# Patient Record
Sex: Male | Born: 1955 | Race: Asian | Hispanic: No | Marital: Married | Smoking: Never smoker
Health system: Southern US, Community
[De-identification: ages and names within clinical notes are randomized; demographics above are authoritative.]

## PROBLEM LIST (undated history)

## (undated) DIAGNOSIS — E7800 Pure hypercholesterolemia, unspecified: Secondary | ICD-10-CM

## (undated) DIAGNOSIS — I214 Non-ST elevation (NSTEMI) myocardial infarction: Secondary | ICD-10-CM

## (undated) DIAGNOSIS — E119 Type 2 diabetes mellitus without complications: Secondary | ICD-10-CM

## (undated) DIAGNOSIS — R112 Nausea with vomiting, unspecified: Secondary | ICD-10-CM

## (undated) DIAGNOSIS — Z9889 Other specified postprocedural states: Secondary | ICD-10-CM

## (undated) DIAGNOSIS — I2510 Atherosclerotic heart disease of native coronary artery without angina pectoris: Secondary | ICD-10-CM

## (undated) DIAGNOSIS — R011 Cardiac murmur, unspecified: Secondary | ICD-10-CM

## (undated) DIAGNOSIS — R55 Syncope and collapse: Secondary | ICD-10-CM

## (undated) DIAGNOSIS — I10 Essential (primary) hypertension: Secondary | ICD-10-CM

## (undated) HISTORY — DX: Cardiac murmur, unspecified: R01.1

## (undated) HISTORY — DX: Essential (primary) hypertension: I10

## (undated) HISTORY — PX: EYE SURGERY: SHX253

## (undated) HISTORY — PX: CATARACT EXTRACTION W/ INTRAOCULAR LENS  IMPLANT, BILATERAL: SHX1307

---

## 2006-08-06 ENCOUNTER — Ambulatory Visit (HOSPITAL_COMMUNITY): Admission: RE | Admit: 2006-08-06 | Discharge: 2006-08-06 | Payer: Self-pay | Admitting: Cardiology

## 2010-03-17 ENCOUNTER — Ambulatory Visit (HOSPITAL_COMMUNITY): Admission: RE | Admit: 2010-03-17 | Discharge: 2010-03-17 | Payer: Self-pay

## 2011-06-12 ENCOUNTER — Ambulatory Visit
Admission: RE | Admit: 2011-06-12 | Discharge: 2011-06-12 | Disposition: A | Discharge status: Home / Self Care | Payer: No Typology Code available for payment source | Source: Ambulatory Visit | Attending: Internal Medicine | Admitting: Internal Medicine

## 2011-06-12 ENCOUNTER — Other Ambulatory Visit: Payer: Self-pay | Admitting: Internal Medicine

## 2011-06-12 ENCOUNTER — Emergency Department (HOSPITAL_COMMUNITY)
Admission: EM | Admit: 2011-06-12 | Discharge: 2011-06-12 | Disposition: A | Discharge status: Home / Self Care | Payer: Worker's Compensation | Attending: Emergency Medicine | Admitting: Emergency Medicine

## 2011-06-12 DIAGNOSIS — S0990XA Unspecified injury of head, initial encounter: Secondary | ICD-10-CM | POA: Insufficient documentation

## 2011-06-12 DIAGNOSIS — W19XXXA Unspecified fall, initial encounter: Secondary | ICD-10-CM

## 2011-06-12 DIAGNOSIS — I10 Essential (primary) hypertension: Secondary | ICD-10-CM | POA: Insufficient documentation

## 2011-06-12 DIAGNOSIS — S0083XA Contusion of other part of head, initial encounter: Secondary | ICD-10-CM | POA: Insufficient documentation

## 2011-06-12 DIAGNOSIS — IMO0002 Reserved for concepts with insufficient information to code with codable children: Secondary | ICD-10-CM | POA: Insufficient documentation

## 2011-06-12 DIAGNOSIS — R404 Transient alteration of awareness: Secondary | ICD-10-CM | POA: Insufficient documentation

## 2011-06-12 DIAGNOSIS — I6200 Nontraumatic subdural hemorrhage, unspecified: Secondary | ICD-10-CM | POA: Insufficient documentation

## 2011-06-12 DIAGNOSIS — E119 Type 2 diabetes mellitus without complications: Secondary | ICD-10-CM | POA: Insufficient documentation

## 2011-06-12 DIAGNOSIS — R51 Headache: Secondary | ICD-10-CM | POA: Insufficient documentation

## 2011-06-12 DIAGNOSIS — Y92009 Unspecified place in unspecified non-institutional (private) residence as the place of occurrence of the external cause: Secondary | ICD-10-CM | POA: Insufficient documentation

## 2011-06-12 DIAGNOSIS — S0003XA Contusion of scalp, initial encounter: Secondary | ICD-10-CM | POA: Insufficient documentation

## 2011-06-12 DIAGNOSIS — Z79899 Other long term (current) drug therapy: Secondary | ICD-10-CM | POA: Insufficient documentation

## 2011-06-12 LAB — DIFFERENTIAL
Basophils Relative: 1 % (ref 0–1)
Lymphocytes Relative: 28 % (ref 12–46)
Neutrophils Relative %: 66 % (ref 43–77)

## 2011-06-12 LAB — CBC
MCH: 30 pg (ref 26.0–34.0)
MCV: 83.1 fL (ref 78.0–100.0)
Platelets: 196 10*3/uL (ref 150–400)
WBC: 10.1 10*3/uL (ref 4.0–10.5)

## 2011-06-12 LAB — BASIC METABOLIC PANEL
BUN: 12 mg/dL (ref 6–23)
CO2: 23 mEq/L (ref 19–32)
Creatinine, Ser: 0.78 mg/dL (ref 0.50–1.35)
GFR calc Af Amer: >90 mL/min (ref >90)
GFR calc non Af Amer: >90 mL/min (ref >90)
Glucose, Bld: 155 mg/dL — ABNORMAL HIGH (ref 70–99)

## 2011-06-12 LAB — PROTIME-INR: Prothrombin Time: 12.6 seconds (ref 11.6–15.2)

## 2011-06-20 NOTE — Consult Note (Signed)
  NAMEJALYN, Nicholas [name] NO.:  [id number]  MEDICAL RECORD NO.:  [id number]  LOCATION:  [facility]                         FACILITY:  [facility]  PHYSICIAN:  Hilda Lias, M.D.   DATE OF BIRTH:  Jul 22, 1956  DATE OF CONSULTATION:  06/12/2011 DATE OF DISCHARGE:  06/12/2011                                CONSULTATION   HISTORY OF PRESENT ILLNESS:  Nicholas [name] is a 55 year old gentleman who this morning at about 8 o'clock suddenly lost balance and hit his head. There was a question of history of decreased level of conscious.  The patient had a CT scan at about 12 o'clock and we were called about 5 o'clock this afternoon to see him.  By the time, I went to see Nicholas [name], he was awake, sitting, and talking via cell phone with his family.  He had minimal discomfort in the skull.  He had no complaint of headache and according to him he feels excellent except that he has a little dizziness.  Clinically, he has a small abrasion in the parietal area.  There is no fracture I can see.  There is no evidence of CSF or blood coming from the nose.  There was full testing with the cervical spine.  PHYSICAL EXAMINATION:  VITAL SIGNS:  Blood pressure is 140/70 with a pulse of 65, respiratory rate of 16, and temperature 99. NEUROLOGIC:  He is oriented x3.  Cranial nerves, the pupils are equal and reactive.  He has full ocular movement.  Face is symmetric. Swallowing normal.  His strength is normal.  Sensation normal.  IMAGING:  A CT scan showed a tiny subdural hematoma at the falcine superior with no evidence of any other compromise to the brain.  There is no fracture whatsoever.  There is no __________, and there is no other lesion except this tiny subdural lesion superiorly.  This injury happened earlier about 9 hours ago.  My feeling was that Nicholas [name] lives here in [place] with his family and I was ready to send him home today with closed head injury instructions but the physician's  assistant, Nicholas [name], told me that Dr. Freida Busman wants the patient to be admitted and they will be calling Trauma.  He is afraid that this subdural hematoma will get bigger.  I will follow Nicholas [name] while he is admitted simply to the Trauma Service. Again from my point of view, clinically he is stable.  This injury happened about 9 hours ago.  The findings at this point are minimal but I have faint consideration because they are in the emergency room.  I will follow Nicholas [name] along with the Trauma Service.          ______________________________ Hilda Lias, M.D.     EB/MEDQ  D:  06/12/2011  T:  06/13/2011  Job:  [id number]  Electronically Signed by Hilda Lias M.D. on 06/20/2011 11:30:52 AM

## 2011-07-25 ENCOUNTER — Ambulatory Visit: Payer: Worker's Compensation | Attending: Neurosurgery | Admitting: Rehabilitative and Restorative Service Providers"

## 2011-07-25 DIAGNOSIS — H8110 Benign paroxysmal vertigo, unspecified ear: Secondary | ICD-10-CM | POA: Insufficient documentation

## 2011-07-25 DIAGNOSIS — IMO0001 Reserved for inherently not codable concepts without codable children: Secondary | ICD-10-CM | POA: Insufficient documentation

## 2011-08-01 ENCOUNTER — Ambulatory Visit: Payer: Worker's Compensation | Attending: Neurosurgery | Admitting: Physical Therapy

## 2011-08-01 DIAGNOSIS — H8110 Benign paroxysmal vertigo, unspecified ear: Secondary | ICD-10-CM | POA: Insufficient documentation

## 2011-08-01 DIAGNOSIS — IMO0001 Reserved for inherently not codable concepts without codable children: Secondary | ICD-10-CM | POA: Insufficient documentation

## 2011-08-04 ENCOUNTER — Encounter: Payer: Self-pay | Admitting: Physical Therapy

## 2011-08-08 ENCOUNTER — Ambulatory Visit: Payer: Worker's Compensation | Admitting: Rehabilitative and Restorative Service Providers"

## 2011-08-11 ENCOUNTER — Encounter: Payer: Self-pay | Admitting: Rehabilitative and Restorative Service Providers"

## 2011-08-16 ENCOUNTER — Ambulatory Visit: Payer: Worker's Compensation | Admitting: Rehabilitative and Restorative Service Providers"

## 2011-08-16 ENCOUNTER — Encounter: Payer: Self-pay | Admitting: Rehabilitative and Restorative Service Providers"

## 2011-08-24 ENCOUNTER — Encounter: Payer: Self-pay | Admitting: Rehabilitative and Restorative Service Providers"

## 2011-09-06 ENCOUNTER — Ambulatory Visit: Payer: Worker's Compensation | Attending: Neurosurgery | Admitting: Rehabilitative and Restorative Service Providers"

## 2011-09-06 DIAGNOSIS — H8110 Benign paroxysmal vertigo, unspecified ear: Secondary | ICD-10-CM | POA: Insufficient documentation

## 2011-09-06 DIAGNOSIS — IMO0001 Reserved for inherently not codable concepts without codable children: Secondary | ICD-10-CM | POA: Insufficient documentation

## 2011-10-03 ENCOUNTER — Other Ambulatory Visit: Payer: Self-pay | Admitting: Neurology

## 2011-10-03 DIAGNOSIS — R0989 Other specified symptoms and signs involving the circulatory and respiratory systems: Secondary | ICD-10-CM

## 2011-11-02 ENCOUNTER — Ambulatory Visit
Admission: RE | Admit: 2011-11-02 | Discharge: 2011-11-02 | Disposition: A | Discharge status: Home / Self Care | Payer: Self-pay | Source: Ambulatory Visit | Attending: Neurology | Admitting: Neurology

## 2011-11-02 DIAGNOSIS — R0989 Other specified symptoms and signs involving the circulatory and respiratory systems: Secondary | ICD-10-CM

## 2011-12-08 ENCOUNTER — Encounter: Payer: Self-pay | Admitting: Vascular Surgery

## 2011-12-11 ENCOUNTER — Encounter: Payer: Self-pay | Admitting: Vascular Surgery

## 2011-12-22 ENCOUNTER — Encounter: Payer: Self-pay | Admitting: Vascular Surgery

## 2011-12-22 ENCOUNTER — Other Ambulatory Visit: Payer: Self-pay

## 2012-01-18 ENCOUNTER — Encounter: Payer: Self-pay | Admitting: Vascular Surgery

## 2012-01-19 ENCOUNTER — Ambulatory Visit (INDEPENDENT_AMBULATORY_CARE_PROVIDER_SITE_OTHER): Payer: BC Managed Care – PPO | Admitting: Vascular Surgery

## 2012-01-19 ENCOUNTER — Ambulatory Visit (INDEPENDENT_AMBULATORY_CARE_PROVIDER_SITE_OTHER): Payer: BC Managed Care – PPO | Admitting: *Deleted

## 2012-01-19 ENCOUNTER — Encounter: Payer: Self-pay | Admitting: Vascular Surgery

## 2012-01-19 VITALS — BP 114/78 | HR 50 | Temp 98.1°F | Ht 62.0 in | Wt 153.3 lb

## 2012-01-19 DIAGNOSIS — I771 Stricture of artery: Secondary | ICD-10-CM | POA: Insufficient documentation

## 2012-01-19 DIAGNOSIS — I6529 Occlusion and stenosis of unspecified carotid artery: Secondary | ICD-10-CM

## 2012-01-19 HISTORY — DX: Stricture of artery: I77.1
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
## 2012-02-01 ENCOUNTER — Encounter: Payer: Self-pay | Admitting: Vascular Surgery

## 2012-02-02 ENCOUNTER — Encounter: Payer: Self-pay | Admitting: Vascular Surgery

## 2012-02-02 ENCOUNTER — Ambulatory Visit
Admission: RE | Admit: 2012-02-02 | Discharge: 2012-02-02 | Disposition: A | Discharge status: Home / Self Care | Payer: BC Managed Care – PPO | Source: Ambulatory Visit | Attending: Vascular Surgery | Admitting: Vascular Surgery

## 2012-02-02 ENCOUNTER — Ambulatory Visit (INDEPENDENT_AMBULATORY_CARE_PROVIDER_SITE_OTHER): Payer: BC Managed Care – PPO | Admitting: Vascular Surgery

## 2012-02-02 VITALS — BP 139/89 | HR 47 | Resp 12 | Ht 62.0 in | Wt 151.1 lb

## 2012-02-02 DIAGNOSIS — I771 Stricture of artery: Secondary | ICD-10-CM

## 2012-02-02 HISTORY — DX: Stricture of artery: I77.1

## 2012-02-02 MED ORDER — IOHEXOL 350 MG/ML SOLN
100.0000 mL | Freq: Once | INTRAVENOUS | Status: AC | PRN
  Administered 2012-02-02: 100 mL via INTRAVENOUS

## 2012-02-02 NOTE — Progress Notes (Signed)
[facility]   Established Carotid Patient  History of Present Illness  Nicholas [name] is a 56 y.o. (1956-03-26) male who presents with chief complaint: follow up on CTA.  This patient was sent for CTA of neck to evaluate the severity of his L SCA stenosis given an abnormal B carotid duplex.  The patient notes no active left arm complaints.  He notes his previous left arm sx occurred days after certain dietary ingestions.  He denies any active vertebrobasilar sx.  He has previously BPPV which was managed with PT.  Past Medical History, Past Surgical History, Social History, Family History, Medications, Allergies, and Review of Systems are unchanged from previous evaluation on 01/19/12.  Physical Examination  Filed Vitals:   02/02/12 1215 02/02/12 1216  BP: 152/88 139/89  Pulse: 50 47  Resp: 12 12  Height: 5\' 2"  (1.575 m)   Weight: 151 lb 1.6 oz (68.539 kg)   SpO2: 100% 100%   Body mass index is 27.64 kg/(m^2).  General: A&O x 3, WDWN   Pulmonary: Sym exp, good air movt, CTAB, no rales, rhonchi, & wheezing   Cardiac: RRR, Nl S1, S2, no Murmurs, rubs or gallops   Vascular:  Vessel  Right  Left   Radial  Palpable  Palpable but weaker than right   Brachial  Palpable  Palpable but weaker than right   Carotid  Palpable, without bruit, likely transmitted heart sounds  Palpable, without bruit, likely transmitted heart sounds   Aorta  Non-palpable  N/A   Femoral  Palpable  Palpable   Popliteal  Non-palpable  Non-palpable   PT  Palpable  Palpable   DP  Palpable  Palpable    Gastrointestinal: soft, NTND, -G/R, - HSM, - masses, - CVAT B   Musculoskeletal: M/S 5/5 throughout , Extremities without ischemic changes   Neurologic: CN 2-12 intact , Pain and light touch intact in extremities , Motor exam as listed above   CTA Neck (Date: 02/02/12): 1. Approximately 70% stenosis of the proximal left subclavian artery, just proximal to the vertebral artery.  2.  High-grade stenoses at the origins of the vertebral arteries bilaterally.  3. Diffuse wall thickening involving the aorta and more prominently in the proximal great vessels, suggesting a nonspecific vasculitis. This is likely the etiology of the left subclavian stenosis.  4. Atherosclerotic irregularity within the carotid bifurcations bilaterally. There is no significant stenosis.  5. Multilevel facet degenerative changes in the cervical spine without significant stenosis.  Based on my review of this patient's CTA, he has a patent L SCA with stenosis >50% in close proximity to the vertebral artery takeoff.  The innominate artery and L CCA are normal in appearance.  Both VA appear equal in size with a L proximal stenosis.  The basilar artery appears widely patent.  Medical Decision Making  Nicholas [name] is a 56 y.o. male who presents with: L SCA stenosis.   As this pt is asx currently, I don't think there is any advantage to intervening, given the proximity of the L VA to the lesion.  Intervention may compromise the artery or embolize the artery. Also the R VA is being read as the dominant VA, so there is no advantage to pursuing intervention on the [place] also as with a patent basilar being fed by the R VA, there should be minimal vertebrobasilar sx.  I do appreciate the haziness read as possible vasculitis in this patient.  As this is relatively limited in extent, I  don't see any advantage to starting steroids in this patient.  I recommended he take a baby Aspirin a day.  I discussed in depth with the patient the nature of atherosclerosis, and emphasized the importance of maximal medical management including strict control of blood pressure, blood glucose, and lipid levels, antiplatelet agents, obtaining regular exercise, and cessation of smoking.  The patient is aware that without maximal medical management the underlying atherosclerotic disease process will progress, limiting the benefit of any  interventions.  If he develops any further left arm symptoms, he will follow up with [place].  Thank you for allowing [place] to participate in this patient's care.  Leonides Sake, MD [facility] Office: [phone] Pager: [phone]  02/02/2012, 1:57 PM

## 2012-02-02 NOTE — Procedures (Unsigned)
CAROTID DUPLEX EXAM  INDICATION:  Carotid disease  HISTORY: Diabetes:  Yes Cardiac:  No Hypertension:  No Smoking:  No Previous Surgery:  No CV History:  Occasional dizziness Amaurosis Fugax No, Paresthesias No, Hemiparesis No                                      RIGHT             LEFT Brachial systolic pressure:         148               109 Brachial Doppler waveforms:         Normal            Biphasic Vertebral direction of flow:        Antegrade         Antegrade/abnormal DUPLEX VELOCITIES (cm/sec) CCA peak systolic                   59                74 ECA peak systolic                   290               112 ICA peak systolic                   99                87 ICA end diastolic                   46                35 PLAQUE MORPHOLOGY:                  Heterogeneous     Heterogeneous PLAQUE AMOUNT:                      Moderate          Minimal PLAQUE LOCATION:                    CCA, ICA, ECA     CCA, ICA, ECA  IMPRESSION: 1. Right internal carotid artery velocities suggest 1%-39% stenosis. 2. Right external carotid artery stenosis. 3. Left internal carotid artery velocities suggest 1%-39% stenosis. 4. Left vertebral artery is antegrade though abnormal. 5. Brachial pressure difference of >20 mmHg suggestive of subclavian     stenosis. 6. Of note, left subclavian velocity of 243 cm/s was noted.  ___________________________________________ Fransisco Hertz, MD  EM/MEDQ  D:  01/19/2012  T:  01/19/2012  Job:  [id number]

## 2018-02-27 ENCOUNTER — Other Ambulatory Visit: Payer: Self-pay

## 2018-02-27 ENCOUNTER — Emergency Department (HOSPITAL_COMMUNITY): Payer: BLUE CROSS/BLUE SHIELD

## 2018-02-27 ENCOUNTER — Encounter (HOSPITAL_COMMUNITY): Payer: Self-pay | Admitting: Emergency Medicine

## 2018-02-27 ENCOUNTER — Inpatient Hospital Stay (HOSPITAL_COMMUNITY)
Admission: EM | Admit: 2018-02-27 | Discharge: 2018-03-09 | DRG: 234 | Disposition: A | Discharge status: Home Health | Payer: BLUE CROSS/BLUE SHIELD | Attending: Cardiothoracic Surgery | Admitting: Cardiothoracic Surgery

## 2018-02-27 DIAGNOSIS — Z91012 Allergy to eggs: Secondary | ICD-10-CM

## 2018-02-27 DIAGNOSIS — J9811 Atelectasis: Secondary | ICD-10-CM | POA: Diagnosis not present

## 2018-02-27 DIAGNOSIS — I25110 Atherosclerotic heart disease of native coronary artery with unstable angina pectoris: Secondary | ICD-10-CM | POA: Diagnosis present

## 2018-02-27 DIAGNOSIS — I708 Atherosclerosis of other arteries: Secondary | ICD-10-CM | POA: Diagnosis present

## 2018-02-27 DIAGNOSIS — I214 Non-ST elevation (NSTEMI) myocardial infarction: Secondary | ICD-10-CM | POA: Diagnosis not present

## 2018-02-27 DIAGNOSIS — R110 Nausea: Secondary | ICD-10-CM | POA: Diagnosis not present

## 2018-02-27 DIAGNOSIS — Z7984 Long term (current) use of oral hypoglycemic drugs: Secondary | ICD-10-CM

## 2018-02-27 DIAGNOSIS — J939 Pneumothorax, unspecified: Secondary | ICD-10-CM

## 2018-02-27 DIAGNOSIS — Z951 Presence of aortocoronary bypass graft: Secondary | ICD-10-CM

## 2018-02-27 DIAGNOSIS — E1151 Type 2 diabetes mellitus with diabetic peripheral angiopathy without gangrene: Secondary | ICD-10-CM | POA: Diagnosis present

## 2018-02-27 DIAGNOSIS — I2510 Atherosclerotic heart disease of native coronary artery without angina pectoris: Secondary | ICD-10-CM

## 2018-02-27 DIAGNOSIS — I200 Unstable angina: Secondary | ICD-10-CM

## 2018-02-27 DIAGNOSIS — Z8249 Family history of ischemic heart disease and other diseases of the circulatory system: Secondary | ICD-10-CM

## 2018-02-27 DIAGNOSIS — E785 Hyperlipidemia, unspecified: Secondary | ICD-10-CM | POA: Diagnosis present

## 2018-02-27 DIAGNOSIS — Z720 Tobacco use: Secondary | ICD-10-CM

## 2018-02-27 DIAGNOSIS — Z7982 Long term (current) use of aspirin: Secondary | ICD-10-CM

## 2018-02-27 DIAGNOSIS — D6959 Other secondary thrombocytopenia: Secondary | ICD-10-CM | POA: Diagnosis present

## 2018-02-27 DIAGNOSIS — J9383 Other pneumothorax: Secondary | ICD-10-CM | POA: Diagnosis present

## 2018-02-27 DIAGNOSIS — E781 Pure hyperglyceridemia: Secondary | ICD-10-CM | POA: Diagnosis present

## 2018-02-27 DIAGNOSIS — D62 Acute posthemorrhagic anemia: Secondary | ICD-10-CM | POA: Diagnosis not present

## 2018-02-27 DIAGNOSIS — I10 Essential (primary) hypertension: Secondary | ICD-10-CM | POA: Diagnosis present

## 2018-02-27 DIAGNOSIS — Z79899 Other long term (current) drug therapy: Secondary | ICD-10-CM

## 2018-02-27 DIAGNOSIS — I340 Nonrheumatic mitral (valve) insufficiency: Secondary | ICD-10-CM | POA: Diagnosis present

## 2018-02-27 DIAGNOSIS — R001 Bradycardia, unspecified: Secondary | ICD-10-CM | POA: Diagnosis present

## 2018-02-27 LAB — BASIC METABOLIC PANEL
ANION GAP: 7 (ref 5–15)
BUN: 14 mg/dL (ref 8–23)
CALCIUM: 8.8 mg/dL — AB (ref 8.9–10.3)
CO2: 23 mmol/L (ref 22–32)
CREATININE: 0.8 mg/dL (ref 0.61–1.24)
Chloride: 106 mmol/L (ref 98–111)
Glucose, Bld: 213 mg/dL — ABNORMAL HIGH (ref 70–99)
Potassium: 4 mmol/L (ref 3.5–5.1)
SODIUM: 136 mmol/L (ref 135–145)

## 2018-02-27 LAB — I-STAT TROPONIN, ED: Troponin i, poc: 0.01 ng/mL (ref 0.00–0.08)

## 2018-02-27 LAB — CBC
HCT: 41.3 % (ref 39.0–52.0)
HEMOGLOBIN: 13.3 g/dL (ref 13.0–17.0)
MCH: 28.1 pg (ref 26.0–34.0)
MCHC: 32.2 g/dL (ref 30.0–36.0)
MCV: 87.3 fL (ref 78.0–100.0)
PLATELETS: 240 10*3/uL (ref 150–400)
RBC: 4.73 MIL/uL (ref 4.22–5.81)
RDW: 13.3 % (ref 11.5–15.5)
WBC: 8 10*3/uL (ref 4.0–10.5)

## 2018-02-27 NOTE — ED Triage Notes (Signed)
Pt reports CP X 2-3 weeks. Central, radiates to back, heavy in nature. Denies SOB, states taking a deep breath makes the pain better.

## 2018-02-28 ENCOUNTER — Observation Stay (HOSPITAL_BASED_OUTPATIENT_CLINIC_OR_DEPARTMENT_OTHER): Payer: BLUE CROSS/BLUE SHIELD

## 2018-02-28 ENCOUNTER — Encounter (HOSPITAL_COMMUNITY): Payer: Self-pay | Admitting: General Practice

## 2018-02-28 ENCOUNTER — Other Ambulatory Visit: Payer: Self-pay

## 2018-02-28 DIAGNOSIS — I200 Unstable angina: Secondary | ICD-10-CM

## 2018-02-28 DIAGNOSIS — I340 Nonrheumatic mitral (valve) insufficiency: Secondary | ICD-10-CM | POA: Diagnosis not present

## 2018-02-28 DIAGNOSIS — E119 Type 2 diabetes mellitus without complications: Secondary | ICD-10-CM

## 2018-02-28 DIAGNOSIS — I10 Essential (primary) hypertension: Secondary | ICD-10-CM | POA: Diagnosis not present

## 2018-02-28 DIAGNOSIS — I214 Non-ST elevation (NSTEMI) myocardial infarction: Secondary | ICD-10-CM

## 2018-02-28 LAB — LIPID PANEL
CHOL/HDL RATIO: 6.1 ratio
Cholesterol: 200 mg/dL (ref 0–200)
HDL: 33 mg/dL — AB (ref >40)
LDL Cholesterol: 112 mg/dL — ABNORMAL HIGH (ref 0–99)
TRIGLYCERIDES: 273 mg/dL — AB (ref <150)
VLDL: 55 mg/dL — ABNORMAL HIGH (ref 0–40)

## 2018-02-28 LAB — TROPONIN I
TROPONIN I: 0.07 ng/mL — AB (ref <0.03)
TROPONIN I: 0.1 ng/mL — AB (ref <0.03)
Troponin I: 0.04 ng/mL (ref <0.03)

## 2018-02-28 LAB — CREATININE, SERUM
CREATININE: 0.83 mg/dL (ref 0.61–1.24)
GFR calc Af Amer: >60 mL/min (ref >60)
GFR calc non Af Amer: >60 mL/min (ref >60)

## 2018-02-28 LAB — ECHOCARDIOGRAM COMPLETE
Height: 62 in
Weight: 2320 oz

## 2018-02-28 LAB — I-STAT TROPONIN, ED: Troponin i, poc: 0.01 ng/mL (ref 0.00–0.08)

## 2018-02-28 LAB — GLUCOSE, CAPILLARY
GLUCOSE-CAPILLARY: 147 mg/dL — AB (ref 70–99)
GLUCOSE-CAPILLARY: 186 mg/dL — AB (ref 70–99)
GLUCOSE-CAPILLARY: 115 mg/dL — AB (ref 70–99)
Glucose-Capillary: 124 mg/dL — ABNORMAL HIGH (ref 70–99)

## 2018-02-28 LAB — HEPARIN LEVEL (UNFRACTIONATED): HEPARIN UNFRACTIONATED: 0.98 [IU]/mL — AB (ref 0.30–0.70)

## 2018-02-28 LAB — PROTIME-INR
INR: 1
PROTHROMBIN TIME: 13.1 s (ref 11.4–15.2)

## 2018-02-28 MED ORDER — LISINOPRIL 5 MG PO TABS
2.5000 mg | ORAL_TABLET | Freq: Every day | ORAL | Status: DC
  Administered 2018-02-28 – 2018-03-03 (×4): 2.5 mg via ORAL
  Filled 2018-02-28 (×4): qty 1

## 2018-02-28 MED ORDER — ATORVASTATIN CALCIUM 10 MG PO TABS: 10.0000 mg | ORAL_TABLET | Freq: Every day | ORAL | Status: DC

## 2018-02-28 MED ORDER — HEPARIN (PORCINE) IN NACL 100-0.45 UNIT/ML-% IJ SOLN
800.0000 [IU]/h | INTRAMUSCULAR | Status: DC
  Administered 2018-02-28 – 2018-03-01 (×2): 800 [IU]/h via INTRAVENOUS
  Filled 2018-02-28 (×2): qty 250

## 2018-02-28 MED ORDER — NITROGLYCERIN 0.4 MG SL SUBL
0.4000 mg | SUBLINGUAL_TABLET | SUBLINGUAL | Status: DC | PRN
  Administered 2018-03-01 – 2018-03-03 (×2): 0.4 mg via SUBLINGUAL
  Filled 2018-02-28 (×2): qty 1

## 2018-02-28 MED ORDER — SODIUM CHLORIDE 0.9% FLUSH: 3.0000 mL | INTRAVENOUS | Status: DC | PRN

## 2018-02-28 MED ORDER — ENOXAPARIN SODIUM 40 MG/0.4ML ~~LOC~~ SOLN
40.0000 mg | SUBCUTANEOUS | Status: DC
  Administered 2018-02-28: 40 mg via SUBCUTANEOUS
  Filled 2018-02-28 (×2): qty 0.4

## 2018-02-28 MED ORDER — ATORVASTATIN CALCIUM 80 MG PO TABS
80.0000 mg | ORAL_TABLET | Freq: Every day | ORAL | Status: DC
  Administered 2018-02-28 – 2018-03-08 (×8): 80 mg via ORAL
  Filled 2018-02-28 (×8): qty 1

## 2018-02-28 MED ORDER — SODIUM CHLORIDE 0.9 % WEIGHT BASED INFUSION
3.0000 mL/kg/h | INTRAVENOUS | Status: DC
  Administered 2018-03-01: 3 mL/kg/h via INTRAVENOUS

## 2018-02-28 MED ORDER — SODIUM CHLORIDE 0.9 % IV SOLN: 250.0000 mL | INTRAVENOUS | Status: DC | PRN

## 2018-02-28 MED ORDER — ASPIRIN 81 MG PO CHEW
81.0000 mg | CHEWABLE_TABLET | ORAL | Status: AC
  Administered 2018-03-01: 81 mg via ORAL
  Filled 2018-02-28: qty 1

## 2018-02-28 MED ORDER — INSULIN ASPART 100 UNIT/ML ~~LOC~~ SOLN
0.0000 [IU] | Freq: Three times a day (TID) | SUBCUTANEOUS | Status: DC
  Administered 2018-02-28: 2 [IU] via SUBCUTANEOUS
  Administered 2018-03-01 – 2018-03-02 (×2): 3 [IU] via SUBCUTANEOUS

## 2018-02-28 MED ORDER — ASPIRIN EC 81 MG PO TBEC
81.0000 mg | DELAYED_RELEASE_TABLET | Freq: Every day | ORAL | Status: DC
  Administered 2018-02-28 – 2018-03-03 (×4): 81 mg via ORAL
  Filled 2018-02-28 (×4): qty 1

## 2018-02-28 MED ORDER — ACETAMINOPHEN 325 MG PO TABS
650.0000 mg | ORAL_TABLET | ORAL | Status: DC | PRN
  Administered 2018-03-03: 650 mg via ORAL
  Filled 2018-02-28: qty 2

## 2018-02-28 MED ORDER — HEPARIN BOLUS VIA INFUSION
3900.0000 [IU] | Freq: Once | INTRAVENOUS | Status: AC
  Administered 2018-02-28: 3900 [IU] via INTRAVENOUS
  Filled 2018-02-28: qty 3900

## 2018-02-28 MED ORDER — ATENOLOL 50 MG PO TABS
50.0000 mg | ORAL_TABLET | Freq: Every day | ORAL | Status: DC
  Administered 2018-02-28 – 2018-03-03 (×3): 50 mg via ORAL
  Filled 2018-02-28 (×5): qty 1

## 2018-02-28 MED ORDER — SODIUM CHLORIDE 0.9% FLUSH
3.0000 mL | Freq: Two times a day (BID) | INTRAVENOUS | Status: DC
  Administered 2018-02-28: 3 mL via INTRAVENOUS

## 2018-02-28 MED ORDER — SODIUM CHLORIDE 0.9% FLUSH
3.0000 mL | Freq: Two times a day (BID) | INTRAVENOUS | Status: DC
  Administered 2018-02-28 – 2018-03-02 (×4): 3 mL via INTRAVENOUS

## 2018-02-28 MED ORDER — ONDANSETRON HCL 4 MG/2ML IJ SOLN
4.0000 mg | Freq: Four times a day (QID) | INTRAMUSCULAR | Status: DC | PRN
  Administered 2018-03-04: 4 mg via INTRAVENOUS
  Filled 2018-02-28: qty 2

## 2018-02-28 MED ORDER — SODIUM CHLORIDE 0.9 % WEIGHT BASED INFUSION: 1.0000 mL/kg/h | INTRAVENOUS | Status: DC

## 2018-02-28 NOTE — H&P (View-Only) (Signed)
Patient admitted by overnight cardiology at 400AM this morning for unstable angina. Multiple risk factors including DM2, HTN, HL, PAD (subclavian stenosis). Trop up just to 0.04 thus far, still having them cycled. EKG inferior TWI and ST depressions. Echo pending. Plan for cath tomorrow. Med therapy with hep gtt, ASA 81, atenolol 50, lisinopril 2.5, atorva 80. No current chest pain.     Nicholas Loper MD   

## 2018-02-28 NOTE — Progress Notes (Signed)
Patient admitted by overnight cardiology at 400AM this morning for unstable angina. Multiple risk factors including DM2, HTN, HL, PAD (subclavian stenosis). Trop up just to 0.04 thus far, still having them cycled. EKG inferior TWI and ST depressions. Echo pending. Plan for cath tomorrow. Med therapy with hep gtt, ASA 81, atenolol 50, lisinopril 2.5, atorva 80. No current chest pain.     Dina RichJonathan Branch MD

## 2018-02-28 NOTE — H&P (Signed)
Cardiology Admission History and Physical:   Patient ID: Nicholas [name]; MRN: [id number]; DOB: 02-27-1956   Admission date: 02/27/2018  Primary Care Provider: Amelia Jo, MD   Chief Complaint:  Crescendo angina  Patient Profile:   Nicholas [name] is a 62 y.o. male with a history of type 2 diabetes mellitus, hypertension, hyperlipidemia and subclavian artery stenosis who presents for an evaluation of substernal chest pain.  History of Present Illness:   [name] has been having off-and-on chest pain for the past 2-3 months. He had a particularly severe episode of pain after mowing the grass outside his house. Yesterday after a heavy meal he also reported chest pain. He had associated shortness of breath. The pain radiated to his right arm. Due to the severity and persistent nature of the pain he then presented to the emergency department. He did not have any diaphoresis, nausea or vomiting. He also does not report any palpitations, presyncope or syncope. Chest pain is described as a heaviness across the chest. He found relief once he was given nitroglycerin in the hospital.  There is no lower extremity edema, orthopnea or paroxysmal nocturnal dyspnea.  EKG:  02/28/2018 - sinus bradycardia, left ventricular hypertrophy, nonspecific T-wave abnormalities in the inferolateral leads. Ventricular rate 52 bpm, PR interval 178, QRS duration 96 and QTC 414 msec.     Review of Systems  Constitutional: Negative for chills, fever and weight loss.  HENT: Negative for hearing loss.   Eyes: Negative for blurred vision.  Respiratory: Negative for cough.   Cardiovascular: Positive for chest pain. Negative for orthopnea, leg swelling and PND.  Gastrointestinal: Negative for heartburn, nausea and vomiting.  Genitourinary: Negative for frequency.  Musculoskeletal: Negative for myalgias.  Skin: Negative for rash.  Neurological: Negative for dizziness and headaches.  Endo/Heme/Allergies: Does not  bruise/bleed easily.  Psychiatric/Behavioral: Negative for depression.      Past Medical History:  Diagnosis Date  . Diabetes mellitus   . Heart murmur   . Hypertension     History reviewed. No pertinent surgical history.   Medications Prior to Admission: Prior to Admission medications   Medication Sig Start Date End Date Taking? Authorizing Provider  aspirin EC 81 MG tablet Take 81 mg by mouth at bedtime.   Yes [provider]  atenolol (TENORMIN) 50 MG tablet Take 50 mg by mouth daily.  12/28/11  Yes [provider]  finasteride (PROSCAR) 5 MG tablet Take 5 mg by mouth daily.   Yes [provider]  lisinopril (PRINIVIL,ZESTRIL) 2.5 MG tablet Take 2.5 mg by mouth at bedtime.   Yes [provider]  metFORMIN (GLUCOPHAGE) 500 MG tablet Take 500 mg by mouth 2 (two) times daily with a meal.   Yes [provider]     Allergies:    Allergies  Allergen Reactions  . Eggs Or Egg-Derived Products Itching and Rash    Social History:   He is married and is a nonsmoker. He drives a truck. He does not drink any alcohol.  Social History   Socioeconomic History  . Marital status: Married    Spouse name: Not on file  . Number of children: Not on file  . Years of education: Not on file  . Highest education level: Not on file  Occupational History  . Not on file  Social Needs  . Financial resource strain: Not on file  . Food insecurity:    Worry: Not on file    Inability: Not on file  . Transportation  needs:    Medical: Not on file    Non-medical: Not on file  Tobacco Use  . Smoking status: Never Smoker  . Smokeless tobacco: Never Used  Substance and Sexual Activity  . Alcohol use: No  . Drug use: No  . Sexual activity: Not on file  Lifestyle  . Physical activity:    Days per week: Not on file    Minutes per session: Not on file  . Stress: Not on file  Relationships  . Social connections:    Talks on phone: Not on file     Gets together: Not on file    Attends religious service: Not on file    Active member of club or organization: Not on file    Attends meetings of clubs or organizations: Not on file    Relationship status: Not on file  . Intimate partner violence:    Fear of current or ex partner: Not on file    Emotionally abused: Not on file    Physically abused: Not on file    Forced sexual activity: Not on file  Other Topics Concern  . Not on file  Social History Narrative  . Not on file    Family History:  The patient's family history includes Heart disease in his mother.   His mother died from a myocardial infarction at age 62. The patient's father is also deceased. He passed away at age 23 from renal failure.   Physical Exam/Data:   Vitals:   02/28/18 0230 02/28/18 0245 02/28/18 0300 02/28/18 0330  BP: (!) 150/82 (!) 146/80 (!) 152/87 127/81  Pulse: (!) 52 (!) 52 (!) 52 (!) 55  Resp: 12 (!) 22 20 19   Temp:      TempSrc:      SpO2: 99% 97% 98% 97%  Weight:      Height:       No intake or output data in the 24 hours ending 02/28/18 0349 Filed Weights   02/28/18 0214  Weight: 65.8 kg (145 lb)   Body mass index is 26.52 kg/m.  General:  Well nourished, well developed, in no acute distress HEENT: normal Lymph: no adenopathy Neck: no JVD Endocrine:  No thryomegaly Vascular: No carotid bruits; FA pulses 2+ bilaterally without bruits  Cardiac:  normal S1, S2; RRR; no murmur  Lungs:  clear to auscultation bilaterally, no wheezing, rhonchi or rales  Abd: soft, nontender, no hepatomegaly  Ext: no edema Musculoskeletal:  No deformities, BUE and BLE strength normal and equal Skin: warm and dry  Neuro:  CNs 2-12 intact, no focal abnormalities noted Psych:  Normal affect    EKG:  02/28/2018 - sinus bradycardia, left ventricular hypertrophy, nonspecific T-wave abnormalities in the inferolateral leads. Ventricular rate 52 bpm, PR interval 178, QRS duration 96 and QTC 414 msec.  Relevant  CV Studies: No recent studies  Laboratory Data:  Chemistry Recent Labs  Lab 02/27/18 2257  NA 136  K 4.0  CL 106  CO2 23  GLUCOSE 213*  BUN 14  CREATININE 0.80  CALCIUM 8.8*  GFRNONAA >60  GFRAA >60  ANIONGAP 7    No results for input(s): PROT, ALBUMIN, AST, ALT, ALKPHOS, BILITOT in the last 168 hours. Hematology Recent Labs  Lab 02/27/18 2257  WBC 8.0  RBC 4.73  HGB 13.3  HCT 41.3  MCV 87.3  MCH 28.1  MCHC 32.2  RDW 13.3  PLT 240   Cardiac EnzymesNo results for input(s): TROPONINI in the last 168 hours.  Recent Labs  Lab 02/27/18 2308 02/28/18 0156  TROPIPOC 0.01 0.01    BNPNo results for input(s): BNP, PROBNP in the last 168 hours.  DDimer No results for input(s): DDIMER in the last 168 hours.  Radiology/Studies:  Dg Chest 2 View  Result Date: 02/27/2018 CLINICAL DATA:  Chest pain EXAM: CHEST - 2 VIEW COMPARISON:  None. FINDINGS: The heart size and mediastinal contours are within normal limits. Both lungs are clear. Mild degenerative changes of the spine. IMPRESSION: No active cardiopulmonary disease. Electronically Signed   By: Jasmine PangKim  Fujinaga M.D.   On: 02/27/2018 23:20    Assessment and Plan:   1. Acute coronary syndrome/unstable angina The patient's symptoms are fairly typical for unstable angina. There has been a definite progression in the severity of his chest pain. Initially he would experience it on moderate exertion. Last night however he had postprandial chest pain. This was only relieved once he came to the hospital and received nitroglycerin.  - Aspirin 81 mg daily - Continue atenolol at 50 mg daily - Start atorvastatin - Will hold off on unfractionated heparin since the initial troponin is negative - Transthoracic echocardiogram in the morning - Will consider left heart catheterization to delineate his coronary anatomy (? 03/01/18) - Will keep NPO for now  2.  Essential hypertension  - Continue atenolol and lisinopril - Echocardiogram to  evaluate LV systolic and diastolic function  3.  Type 2 diabetes mellitus  - Will hold metformin in anticipation of a cardiac catheterization in the near future.    Severity of Illness: The appropriate patient status for this patient is OBSERVATION. Observation status is judged to be reasonable and necessary in order to provide the required intensity of service to ensure the patient's safety. The patient's presenting symptoms, physical exam findings, and initial radiographic and laboratory data in the context of their medical condition is felt to place them at decreased risk for further clinical deterioration. Furthermore, it is anticipated that the patient will be medically stable for discharge from the hospital within 2 midnights of admission. The following factors support the patient status of observation.   " The patient's presenting symptoms include substernal chest pain. " The physical exam findings include normal cardiovascular auscultation. " The initial radiographic and laboratory data are unremarkable.     For questions or updates, please contact [facility] Please consult [email or web address] for contact info under Cardiology/STEMI.    Signed, Lonie PeakMohammed W Kollyns Mickelson, MD  02/28/2018 3:49 AM

## 2018-02-28 NOTE — ED Provider Notes (Signed)
[facility]   CSN: [id number] Arrival date & time: 02/27/18  2223     History   Chief Complaint Chief Complaint  Patient presents with  . Chest Pain    HPI Nicholas [name] is a 62 y.o. male.  The history is provided by the patient and a relative.  Chest Pain   This is a new problem. Episode onset: several hrs ago. The problem has been resolved. The pain is associated with exertion. The pain is present in the substernal region. The pain is severe. The quality of the pain is described as heavy. The pain radiates to the left shoulder and right shoulder. Episode Length: several minutes. Associated symptoms include shortness of breath. Pertinent negatives include no diaphoresis and no vomiting. He has tried rest for the symptoms. The treatment provided significant relief. Risk factors include being elderly.  His past medical history is significant for diabetes and hypertension.  Patient with history of hypertension and diabetes presents with chest pain.  He reports several hours ago he had onset of chest heaviness rating to both shoulders for several minutes. He is now chest pain-free.  This episode was triggered by exertion.  He recalls a similar episode several weeks ago while mowing the grass.  It improved with rest Known history of CAD. Past Medical History:  Diagnosis Date  . Diabetes mellitus   . Heart murmur   . Hypertension     Patient Active Problem List   Diagnosis Date Noted  . Stricture of artery (HCC) 02/02/2012  . Subclavian arterial stenosis (HCC) 01/19/2012    History reviewed. No pertinent surgical history.      Home Medications    Prior to Admission medications   Medication Sig Start Date End Date Taking? Authorizing Provider  atenolol (TENORMIN) 50 MG tablet Take 50 mg by mouth daily.  12/28/11   [provider]  GLIPIZIDE PO Take 2.5 mg by mouth daily.     [provider]    Family  History Family History  Problem Relation Age of Onset  . Heart disease Mother     Social History Social History   Tobacco Use  . Smoking status: Never Smoker  . Smokeless tobacco: Never Used  Substance Use Topics  . Alcohol use: No  . Drug use: No     Allergies   Eggs or egg-derived products   Review of Systems Review of Systems  Constitutional: Negative for diaphoresis.  Respiratory: Positive for shortness of breath.   Cardiovascular: Positive for chest pain.  Gastrointestinal: Negative for vomiting.  All other systems reviewed and are negative.    Physical Exam Updated Vital Signs BP 138/82   Pulse (!) 50   Temp 97.7 F (36.5 C) (Oral)   Resp (!) 26   Ht 1.575 m (5\' 2" )   Wt 65.8 kg (145 lb)   SpO2 97%   BMI 26.52 kg/m   Physical Exam CONSTITUTIONAL: Well developed/well nourished, elderly, no acute distress, smiling HEAD: Normocephalic/atraumatic EYES: EOMI ENMT: Mucous membranes moist NECK: supple no meningeal signs SPINE/BACK:entire spine nontender CV: S1/S2 noted, no murmurs/rubs/gallops noted LUNGS: Lungs are clear to auscultation bilaterally, no apparent distress ABDOMEN: soft, nontender GU:no cva tenderness NEURO: Pt is awake/alert/appropriate, moves all extremitiesx4.  No facial droop.   EXTREMITIES: pulses normal/equalx4, full ROM, no calf tenderness SKIN: warm, color normal PSYCH: no abnormalities of mood noted, alert and oriented to situation   ED Treatments / Results  Labs (all labs ordered  are listed, but only abnormal results are displayed) Labs Reviewed  BASIC METABOLIC PANEL - Abnormal; Notable for the following components:      Result Value   Glucose, Bld 213 (*)    Calcium 8.8 (*)    All other components within normal limits  CBC  I-STAT TROPONIN, ED  I-STAT TROPONIN, ED    EKG EKG Interpretation  Date/Time:  Thursday February 28 2018 01:46:59 EDT Ventricular Rate:  52 PR Interval:  178 QRS Duration: 96 QT  Interval:  446 QTC Calculation: 414 R Axis:   -13 Text Interpretation:  Sinus bradycardia Moderate voltage criteria for LVH, may be normal variant No significant change since last tracing Abnormal ekg Confirmed by Zadie Rhine ([id number]) on 02/28/2018 1:52:05 AM   Radiology Dg Chest 2 View  Result Date: 02/27/2018 CLINICAL DATA:  Chest pain EXAM: CHEST - 2 VIEW COMPARISON:  None. FINDINGS: The heart size and mediastinal contours are within normal limits. Both lungs are clear. Mild degenerative changes of the spine. IMPRESSION: No active cardiopulmonary disease. Electronically Signed   By: Jasmine Pang M.D.   On: 02/27/2018 23:20    Procedures Procedures   Medications Ordered in ED Medications - No data to display   Initial Impression / Assessment and Plan / ED Course  I have reviewed the triage vital signs and the nursing notes.  Pertinent labs & imaging results that were available during my care of the patient were reviewed by me and considered in my medical decision making (see chart for details).     3:11 AM Patient with concerning history of chest pain.  He reports while exerting himself he will have chest heaviness radiates to both shoulders.  He is now chest pain-free.  He has an abnormal EKG.  I am concerned for unstable angina. Initial troponin is negative.  I discussed the case with cardiology fellow, and they agree with assessment and will admit to their service.  Final Clinical Impressions(s) / ED Diagnoses   Final diagnoses:  Unstable angina [facility])    ED Discharge Orders    None       Zadie Rhine, MD 02/28/18 [phone]

## 2018-02-28 NOTE — Progress Notes (Signed)
ANTICOAGULATION CONSULT NOTE - Initial Consult  Pharmacy Consult for Heparin Indication: chest pain/ACS  Allergies  Allergen Reactions  . Eggs Or Egg-Derived Products Itching and Rash    Patient Measurements: Height: 5\' 2"  (157.5 cm) Weight: 145 lb (65.8 kg) IBW/kg (Calculated) : 54.6   Vital Signs: Temp: 97.6 F (36.4 C) (07/04 0417) Temp Source: Oral (07/04 0417) BP: 164/78 (07/04 0417) Pulse Rate: 50 (07/04 0417)  Labs: Recent Labs    02/27/18 2257 02/28/18 0346 02/28/18 0612  HGB 13.3  --   --   HCT 41.3  --   --   PLT 240  --   --   LABPROT  --   --  13.1  INR  --   --  1.00  CREATININE 0.80 0.83  --   TROPONINI  --  0.04*  --     Estimated Creatinine Clearance: 75.2 mL/min (by C-G formula based on SCr of 0.83 mg/dL).   Medical History: Past Medical History:  Diagnosis Date  . Diabetes mellitus   . Heart murmur   . Hypertension     Assessment: 62 yo male admitted with substernal chest pain. Starting on a heparin drip.  Goal of Therapy:  Heparin level 0.3-0.7 units/ml Monitor platelets by anticoagulation protocol: Yes   Plan:  Give 3900 units bolus x 1 Start heparin infusion at 800 units/hr Check anti-Xa level in 6 hours and daily while on heparin Continue to monitor H&H and platelets  Tera Materatherine A Lizandro Spellman, PharmD, FCCM 02/28/2018,7:37 AM

## 2018-03-01 ENCOUNTER — Ambulatory Visit (HOSPITAL_COMMUNITY): Payer: BLUE CROSS/BLUE SHIELD

## 2018-03-01 ENCOUNTER — Other Ambulatory Visit: Payer: Self-pay | Admitting: *Deleted

## 2018-03-01 ENCOUNTER — Inpatient Hospital Stay (HOSPITAL_COMMUNITY): Payer: BLUE CROSS/BLUE SHIELD

## 2018-03-01 ENCOUNTER — Inpatient Hospital Stay (HOSPITAL_COMMUNITY)
Admission: EM | Disposition: A | Discharge status: Home Health | Payer: Self-pay | Source: Home / Self Care | Attending: Cardiothoracic Surgery

## 2018-03-01 ENCOUNTER — Encounter (HOSPITAL_COMMUNITY): Payer: Self-pay | Admitting: Cardiology

## 2018-03-01 DIAGNOSIS — I708 Atherosclerosis of other arteries: Secondary | ICD-10-CM | POA: Diagnosis present

## 2018-03-01 DIAGNOSIS — E785 Hyperlipidemia, unspecified: Secondary | ICD-10-CM

## 2018-03-01 DIAGNOSIS — Z7984 Long term (current) use of oral hypoglycemic drugs: Secondary | ICD-10-CM | POA: Diagnosis not present

## 2018-03-01 DIAGNOSIS — Z01810 Encounter for preprocedural cardiovascular examination: Secondary | ICD-10-CM

## 2018-03-01 DIAGNOSIS — R110 Nausea: Secondary | ICD-10-CM | POA: Diagnosis not present

## 2018-03-01 DIAGNOSIS — Z91012 Allergy to eggs: Secondary | ICD-10-CM | POA: Diagnosis not present

## 2018-03-01 DIAGNOSIS — Z720 Tobacco use: Secondary | ICD-10-CM | POA: Diagnosis not present

## 2018-03-01 DIAGNOSIS — I200 Unstable angina: Secondary | ICD-10-CM | POA: Diagnosis present

## 2018-03-01 DIAGNOSIS — J9383 Other pneumothorax: Secondary | ICD-10-CM | POA: Diagnosis present

## 2018-03-01 DIAGNOSIS — Z8249 Family history of ischemic heart disease and other diseases of the circulatory system: Secondary | ICD-10-CM | POA: Diagnosis not present

## 2018-03-01 DIAGNOSIS — I25110 Atherosclerotic heart disease of native coronary artery with unstable angina pectoris: Secondary | ICD-10-CM | POA: Diagnosis present

## 2018-03-01 DIAGNOSIS — I10 Essential (primary) hypertension: Secondary | ICD-10-CM | POA: Diagnosis present

## 2018-03-01 DIAGNOSIS — E119 Type 2 diabetes mellitus without complications: Secondary | ICD-10-CM | POA: Diagnosis not present

## 2018-03-01 DIAGNOSIS — Z79899 Other long term (current) drug therapy: Secondary | ICD-10-CM | POA: Diagnosis not present

## 2018-03-01 DIAGNOSIS — E1151 Type 2 diabetes mellitus with diabetic peripheral angiopathy without gangrene: Secondary | ICD-10-CM | POA: Diagnosis present

## 2018-03-01 DIAGNOSIS — I214 Non-ST elevation (NSTEMI) myocardial infarction: Secondary | ICD-10-CM | POA: Diagnosis present

## 2018-03-01 DIAGNOSIS — Z7982 Long term (current) use of aspirin: Secondary | ICD-10-CM | POA: Diagnosis not present

## 2018-03-01 DIAGNOSIS — I2510 Atherosclerotic heart disease of native coronary artery without angina pectoris: Secondary | ICD-10-CM

## 2018-03-01 DIAGNOSIS — I771 Stricture of artery: Secondary | ICD-10-CM | POA: Diagnosis not present

## 2018-03-01 DIAGNOSIS — R001 Bradycardia, unspecified: Secondary | ICD-10-CM | POA: Diagnosis present

## 2018-03-01 DIAGNOSIS — I340 Nonrheumatic mitral (valve) insufficiency: Secondary | ICD-10-CM | POA: Diagnosis present

## 2018-03-01 DIAGNOSIS — I25118 Atherosclerotic heart disease of native coronary artery with other forms of angina pectoris: Secondary | ICD-10-CM | POA: Diagnosis not present

## 2018-03-01 DIAGNOSIS — D6959 Other secondary thrombocytopenia: Secondary | ICD-10-CM | POA: Diagnosis present

## 2018-03-01 DIAGNOSIS — J9811 Atelectasis: Secondary | ICD-10-CM | POA: Diagnosis not present

## 2018-03-01 DIAGNOSIS — E781 Pure hyperglyceridemia: Secondary | ICD-10-CM | POA: Diagnosis present

## 2018-03-01 DIAGNOSIS — D62 Acute posthemorrhagic anemia: Secondary | ICD-10-CM | POA: Diagnosis not present

## 2018-03-01 HISTORY — PX: LEFT HEART CATH AND CORONARY ANGIOGRAPHY: CATH118249

## 2018-03-01 HISTORY — PX: CORONARY PRESSURE/FFR STUDY: CATH118243

## 2018-03-01 LAB — PULMONARY FUNCTION TEST
FEF 25-75 Post: 1.55 L/sec
FEF 25-75 Pre: 1.1 L/sec
FEF2575-%Change-Post: 40 %
FEF2575-%Pred-Post: 75 %
FEF2575-%Pred-Pre: 53 %
FEV1-%Change-Post: 6 %
FEV1-%Pred-Post: 56 %
FEV1-%Pred-Pre: 53 %
FEV1-Post: 1.41 L
FEV1-Pre: 1.32 L
FEV1FVC-%Change-Post: 10 %
FEV1FVC-%Pred-Pre: 103 %
FEV6-%Change-Post: -1 %
FEV6-%Pred-Post: 52 %
FEV6-%Pred-Pre: 53 %
FEV6-Post: 1.64 L
FEV6-Pre: 1.67 L
FEV6FVC-%Change-Post: 1 %
FEV6FVC-%Pred-Post: 106 %
FEV6FVC-%Pred-Pre: 104 %
FVC-%Change-Post: -3 %
FVC-%Pred-Post: 49 %
FVC-%Pred-Pre: 50 %
FVC-Post: 1.64 L
Post FEV1/FVC ratio: 86 %
Post FEV6/FVC ratio: 100 %
Pre FEV1/FVC ratio: 78 %
Pre FEV6/FVC Ratio: 98 %

## 2018-03-01 LAB — BASIC METABOLIC PANEL
Anion gap: 7 (ref 5–15)
BUN: 8 mg/dL (ref 8–23)
CHLORIDE: 109 mmol/L (ref 98–111)
CO2: 24 mmol/L (ref 22–32)
Calcium: 8.4 mg/dL — ABNORMAL LOW (ref 8.9–10.3)
Creatinine, Ser: 0.81 mg/dL (ref 0.61–1.24)
GFR calc Af Amer: >60 mL/min (ref >60)
GFR calc non Af Amer: >60 mL/min (ref >60)
GLUCOSE: 133 mg/dL — AB (ref 70–99)
POTASSIUM: 4.1 mmol/L (ref 3.5–5.1)
Sodium: 140 mmol/L (ref 135–145)

## 2018-03-01 LAB — URINALYSIS, ROUTINE W REFLEX MICROSCOPIC
Bilirubin Urine: NEGATIVE
Glucose, UA: NEGATIVE mg/dL
Hgb urine dipstick: NEGATIVE
Ketones, ur: NEGATIVE mg/dL
Leukocytes, UA: NEGATIVE
Nitrite: NEGATIVE
Protein, ur: NEGATIVE mg/dL
Specific Gravity, Urine: 1.004 — ABNORMAL LOW (ref 1.005–1.030)
pH: 6 (ref 5.0–8.0)

## 2018-03-01 LAB — TROPONIN I: TROPONIN I: 0.11 ng/mL — AB (ref <0.03)

## 2018-03-01 LAB — POCT ACTIVATED CLOTTING TIME: Activated Clotting Time: 279 seconds

## 2018-03-01 LAB — GLUCOSE, CAPILLARY
Glucose-Capillary: 159 mg/dL — ABNORMAL HIGH (ref 70–99)
Glucose-Capillary: 127 mg/dL — ABNORMAL HIGH (ref 70–99)
Glucose-Capillary: 205 mg/dL — ABNORMAL HIGH (ref 70–99)
Glucose-Capillary: 159 mg/dL — ABNORMAL HIGH (ref 70–99)

## 2018-03-01 LAB — HIV ANTIBODY (ROUTINE TESTING W REFLEX): HIV Screen 4th Generation wRfx: NONREACTIVE

## 2018-03-01 LAB — HEPARIN LEVEL (UNFRACTIONATED): Heparin Unfractionated: 0.52 IU/mL (ref 0.30–0.70)

## 2018-03-01 SURGERY — LEFT HEART CATH AND CORONARY ANGIOGRAPHY
Anesthesia: LOCAL

## 2018-03-01 MED ORDER — NITROGLYCERIN 1 MG/10 ML FOR IR/CATH LAB
INTRA_ARTERIAL | Status: AC
  Filled 2018-03-01: qty 10

## 2018-03-01 MED ORDER — SODIUM CHLORIDE 0.9 % WEIGHT BASED INFUSION: 1.0000 mL/kg/h | INTRAVENOUS | Status: AC

## 2018-03-01 MED ORDER — SODIUM CHLORIDE 0.9% FLUSH
3.0000 mL | Freq: Two times a day (BID) | INTRAVENOUS | Status: DC
  Administered 2018-03-01 – 2018-03-03 (×3): 3 mL via INTRAVENOUS

## 2018-03-01 MED ORDER — ISOSORBIDE MONONITRATE ER 30 MG PO TB24
30.0000 mg | ORAL_TABLET | Freq: Every day | ORAL | Status: DC
  Administered 2018-03-01 – 2018-03-03 (×3): 30 mg via ORAL
  Filled 2018-03-01 (×3): qty 1

## 2018-03-01 MED ORDER — FENTANYL CITRATE (PF) 100 MCG/2ML IJ SOLN
INTRAMUSCULAR | Status: AC
  Filled 2018-03-01: qty 2

## 2018-03-01 MED ORDER — HEPARIN SODIUM (PORCINE) 1000 UNIT/ML IJ SOLN
INTRAMUSCULAR | Status: DC | PRN
  Administered 2018-03-01 (×2): 3500 [IU] via INTRAVENOUS

## 2018-03-01 MED ORDER — DOCUSATE SODIUM 100 MG PO CAPS
100.0000 mg | ORAL_CAPSULE | Freq: Every day | ORAL | Status: DC | PRN
  Administered 2018-03-02: 100 mg via ORAL
  Filled 2018-03-01 (×2): qty 1

## 2018-03-01 MED ORDER — SODIUM CHLORIDE 0.9 % IV SOLN: 250.0000 mL | INTRAVENOUS | Status: DC | PRN

## 2018-03-01 MED ORDER — FINASTERIDE 5 MG PO TABS
5.0000 mg | ORAL_TABLET | Freq: Every day | ORAL | Status: DC
  Administered 2018-03-01 – 2018-03-09 (×8): 5 mg via ORAL
  Filled 2018-03-01 (×8): qty 1

## 2018-03-01 MED ORDER — LIDOCAINE HCL (PF) 1 % IJ SOLN
INTRAMUSCULAR | Status: AC
  Filled 2018-03-01: qty 30

## 2018-03-01 MED ORDER — MIDAZOLAM HCL 2 MG/2ML IJ SOLN
INTRAMUSCULAR | Status: AC
  Filled 2018-03-01: qty 2

## 2018-03-01 MED ORDER — MIDAZOLAM HCL 2 MG/2ML IJ SOLN
INTRAMUSCULAR | Status: DC | PRN
  Administered 2018-03-01: 2 mg via INTRAVENOUS

## 2018-03-01 MED ORDER — NITROGLYCERIN IN D5W 200-5 MCG/ML-% IV SOLN
INTRAVENOUS | Status: AC
  Filled 2018-03-01: qty 250

## 2018-03-01 MED ORDER — VERAPAMIL HCL 2.5 MG/ML IV SOLN
INTRAVENOUS | Status: DC | PRN
  Administered 2018-03-01: 10 mL via INTRA_ARTERIAL

## 2018-03-01 MED ORDER — ADENOSINE (DIAGNOSTIC) 140MCG/KG/MIN
INTRAVENOUS | Status: DC | PRN
  Administered 2018-03-01: 140 ug/kg/min via INTRAVENOUS

## 2018-03-01 MED ORDER — ADENOSINE 12 MG/4ML IV SOLN
INTRAVENOUS | Status: AC
  Filled 2018-03-01: qty 16

## 2018-03-01 MED ORDER — NITROGLYCERIN 1 MG/10 ML FOR IR/CATH LAB
INTRA_ARTERIAL | Status: DC | PRN
  Administered 2018-03-01: 200 ug via INTRACORONARY

## 2018-03-01 MED ORDER — FENTANYL CITRATE (PF) 100 MCG/2ML IJ SOLN
INTRAMUSCULAR | Status: DC | PRN
  Administered 2018-03-01: 25 ug via INTRAVENOUS

## 2018-03-01 MED ORDER — HEPARIN (PORCINE) IN NACL 1000-0.9 UT/500ML-% IV SOLN
INTRAVENOUS | Status: AC
  Filled 2018-03-01: qty 1000

## 2018-03-01 MED ORDER — HEPARIN (PORCINE) IN NACL 100-0.45 UNIT/ML-% IJ SOLN
950.0000 [IU]/h | INTRAMUSCULAR | Status: DC
  Administered 2018-03-01: 800 [IU]/h via INTRAVENOUS
  Administered 2018-03-02 – 2018-03-03 (×2): 950 [IU]/h via INTRAVENOUS
  Filled 2018-03-01 (×3): qty 250

## 2018-03-01 MED ORDER — LIDOCAINE HCL (PF) 1 % IJ SOLN
INTRAMUSCULAR | Status: DC | PRN
  Administered 2018-03-01: 2 mL

## 2018-03-01 MED ORDER — SODIUM CHLORIDE 0.9% FLUSH: 3.0000 mL | INTRAVENOUS | Status: DC | PRN

## 2018-03-01 MED ORDER — HEPARIN (PORCINE) IN NACL 2-0.9 UNITS/ML
INTRAMUSCULAR | Status: AC | PRN
  Administered 2018-03-01 (×2): 500 mL

## 2018-03-01 MED ORDER — HEPARIN SODIUM (PORCINE) 1000 UNIT/ML IJ SOLN
INTRAMUSCULAR | Status: AC
  Filled 2018-03-01: qty 1

## 2018-03-01 MED ORDER — ALBUTEROL SULFATE (2.5 MG/3ML) 0.083% IN NEBU
2.5000 mg | INHALATION_SOLUTION | Freq: Once | RESPIRATORY_TRACT | Status: AC
  Administered 2018-03-01: 2.5 mg via RESPIRATORY_TRACT

## 2018-03-01 SURGICAL SUPPLY — 12 items
CATH 5FR JL3.5 JR4 ANG PIG MP (CATHETERS) ×1 IMPLANT
CATH VISTA GUIDE 6FR XBLAD3.5 (CATHETERS) ×1 IMPLANT
DEVICE RAD COMP TR BAND LRG (VASCULAR PRODUCTS) ×1 IMPLANT
GLIDESHEATH SLEND SS 6F .021 (SHEATH) ×1 IMPLANT
GUIDEWIRE INQWIRE 1.5J.035X260 (WIRE) IMPLANT
GUIDEWIRE PRESSURE COMET II (WIRE) ×1 IMPLANT
INQWIRE 1.5J .035X260CM (WIRE) ×2
KIT HEART LEFT (KITS) ×2 IMPLANT
KIT HEMO VALVE WATCHDOG (MISCELLANEOUS) ×1 IMPLANT
PACK CARDIAC CATHETERIZATION (CUSTOM PROCEDURE TRAY) ×2 IMPLANT
TRANSDUCER W/STOPCOCK (MISCELLANEOUS) ×2 IMPLANT
TUBING CIL FLEX 10 FLL-RA (TUBING) ×2 IMPLANT

## 2018-03-01 NOTE — Progress Notes (Signed)
Pt c/o of tingling and burning in his chest. PA notified and ordered to do EKG and to give 1 SL NTG. Will carry out orders and continue to monitor.

## 2018-03-01 NOTE — Consult Note (Signed)
[address]       [place] [address]             [phone]        Dewane Timson [facility] Medical Record #[id number] Date of Birth: 1956/02/14  Referring: No ref. provider found Primary Care: Amelia Jo, MD Primary Cardiologist: Dr. Verdis Prime  Chief Complaint: Chest pain Chief Complaint  Patient presents with  . Chest Pain  Patient examined, images of coronary angiogram and chest x-ray personally reviewed and counseled with patient and family.  History of Present Illness:     Patient is a 62 year old diabetic non-smoker [place] male who was admitted to the hospital with severe chest pain after mowing his grass and positive cardiac enzymes-non-STEMI.  After being placed on heparin and given nitroglycerin his chest pain has resolved.  Cardiac enzymes up to 0.11.  Echocardiogram shows overall preserved LV EF with inferior wall hypokinesia, mild-MR.  Angiogram show occlusion of the distal RCA with reconstitution via collaterals and high-grade 95% stenosis of the LAD diagonal, 80 to 90% stenosis of the OM1.  LVEDP 20.  Patient's cardiologist has recommended CABG as the best long-term therapy for his multivessel CAD and history of diabetes.  Patient has a 60-70% stenosis of the subclavian artery proximal to the takeoff of the IMA which may be hemodynamically significant.  A staged PCI of the proximal subclavian would be indicated after the patient recovers from CABG operation.  In order to protect the IMA graft from proximal obstruction/dissection a free mammary graft may be indicated at the time of surgery.   Current Activity/ Functional Status: Patient has fairly good functional level prior to hospitalization, lives with family.   Zubrod Score: At the time of surgery this patient's most appropriate activity status/level should be described as: []     0    Normal activity, no symptoms []     1    Restricted in physical strenuous activity but ambulatory, able to do  out light work [x]     2    Ambulatory and capable of self care, unable to do work activities, up and about                 more than 50%  Of the time                            []     3    Only limited self care, in bed greater than 50% of waking hours []     4    Completely disabled, no self care, confined to bed or chair []     5    Moribund  Past Medical History:  Diagnosis Date  . Diabetes mellitus   . Heart murmur   . Hypertension     Past Surgical History:  Procedure Laterality Date  . EYE SURGERY    . INTRAVASCULAR PRESSURE WIRE/FFR STUDY N/A 03/01/2018   Procedure: INTRAVASCULAR PRESSURE WIRE/FFR STUDY;  Surgeon: Swaziland, Peter M, MD;  Location: [facility] INVASIVE CV LAB;  Service: Cardiovascular;  Laterality: N/A;  . LEFT HEART CATH AND CORONARY ANGIOGRAPHY N/A 03/01/2018   Procedure: LEFT HEART CATH AND CORONARY ANGIOGRAPHY;  Surgeon: Swaziland, Peter M, MD;  Location: [facility] INVASIVE CV LAB;  Service: Cardiovascular;  Laterality: N/A;    Social History   Tobacco Use  Smoking Status Never Smoker  Smokeless Tobacco Never Used    Social History   Substance  and Sexual Activity  Alcohol Use No     Allergies  Allergen Reactions  . Eggs Or Egg-Derived Products Itching and Rash    Current Facility-Administered Medications  Medication Dose Route Frequency Provider Last Rate Last Dose  . 0.9 %  sodium chloride infusion  250 mL Intravenous PRN SwazilandJordan, Peter M, MD      . 0.9 %  sodium chloride infusion  250 mL Intravenous PRN SwazilandJordan, Peter M, MD      . 0.9% sodium chloride infusion  1 mL/kg/hr Intravenous Continuous SwazilandJordan, Peter M, MD 63.1 mL/hr at 03/01/18 1139 1 mL/kg/hr at 03/01/18 1139  . acetaminophen (TYLENOL) tablet 650 mg  650 mg Oral Q4H PRN SwazilandJordan, Peter M, MD      . aspirin EC tablet 81 mg  81 mg Oral QHS SwazilandJordan, Peter M, MD   81 mg at 02/28/18 2210  . atenolol (TENORMIN) tablet 50 mg  50 mg Oral Daily SwazilandJordan, Peter M, MD   50 mg at 02/28/18 1008  . atorvastatin (LIPITOR) tablet  80 mg  80 mg Oral q1800 SwazilandJordan, Peter M, MD   80 mg at 02/28/18 1737  . docusate sodium (COLACE) capsule 100-200 mg  100-200 mg Oral Daily PRN Dunn, Dayna N, PA-C      . finasteride (PROSCAR) tablet 5 mg  5 mg Oral Daily Dunn, Dayna N, PA-C   5 mg at 03/01/18 1450  . heparin ADULT infusion 100 units/mL (25000 units/22350mL sodium chloride 0.45%)  800 Units/hr Intravenous Continuous PomeroyDurham, Jennifer D, [facility]      . insulin aspart (novoLOG) injection 0-9 Units  0-9 Units Subcutaneous TID WC SwazilandJordan, Peter M, MD   2 Units at 02/28/18 1304  . isosorbide mononitrate (IMDUR) 24 hr tablet 30 mg  30 mg Oral Daily SwazilandJordan, Peter M, MD   30 mg at 03/01/18 1450  . lisinopril (PRINIVIL,ZESTRIL) tablet 2.5 mg  2.5 mg Oral QHS SwazilandJordan, Peter M, MD   2.5 mg at 02/28/18 2210  . nitroGLYCERIN (NITROSTAT) SL tablet 0.4 mg  0.4 mg Sublingual Q5 Min x 3 PRN SwazilandJordan, Peter M, MD   0.4 mg at 03/01/18 0959  . ondansetron (ZOFRAN) injection 4 mg  4 mg Intravenous Q6H PRN SwazilandJordan, Peter M, MD      . sodium chloride flush (NS) 0.9 % injection 3 mL  3 mL Intravenous Q12H SwazilandJordan, Peter M, MD   3 mL at 02/28/18 1016  . sodium chloride flush (NS) 0.9 % injection 3 mL  3 mL Intravenous PRN SwazilandJordan, Peter M, MD      . sodium chloride flush (NS) 0.9 % injection 3 mL  3 mL Intravenous Q12H SwazilandJordan, Peter M, MD      . sodium chloride flush (NS) 0.9 % injection 3 mL  3 mL Intravenous PRN SwazilandJordan, Peter M, MD        Medications Prior to Admission  Medication Sig Dispense Refill Last Dose  . aspirin EC 81 MG tablet Take 81 mg by mouth at bedtime.   02/27/2018 at 2000  . atenolol (TENORMIN) 50 MG tablet Take 50 mg by mouth daily.    02/27/2018 at 0600  . finasteride (PROSCAR) 5 MG tablet Take 5 mg by mouth daily.   02/27/2018 at Unknown time  . lisinopril (PRINIVIL,ZESTRIL) 2.5 MG tablet Take 2.5 mg by mouth at bedtime.   02/27/2018 at Unknown time  . metFORMIN (GLUCOPHAGE) 500 MG tablet Take 500 mg by mouth 2 (two) times daily with a meal.  02/27/2018  at Unknown time    Family History  Problem Relation Age of Onset  . Heart disease Mother      Review of Systems:   ROS      Cardiac Review of Systems: Y or  [    ]= no  Chest Pain [  y  ]  Resting SOB [   ] Exertional SOB  [  y]  Orthopnea [  ]   Pedal Edema [   ]    Palpitations [ y ] Syncope  [  ]   Presyncope [   ]  General Review of Systems: [Y] = yes [  ]=no Constitional: recent weight change [  ]; anorexia [  ]; fatigue [  ]; nausea [  ]; night sweats [  ]; fever [  ]; or chills [  ]                                                               Dental: Last Dentist visit:   Eye : blurred vision [  ]; diplopia [   ]; vision changes [  ];  Amaurosis fugax[  ]; Resp: cough [  ];  wheezing[  ];  hemoptysis[  ]; shortness of breath[  ]; paroxysmal nocturnal dyspnea[  ]; dyspnea on exertion[  ]; or orthopnea[  ];  GI:  gallstones[  ], vomiting[  ];  dysphagia[  ]; melena[  ];  hematochezia [  ]; heartburn[  ];   Hx of  Colonoscopy[  ]; GU: kidney stones [  ]; hematuria[  ];   dysuria [  ];  nocturia[  ];  history of     obstruction [  ]; urinary frequency [  ]             Skin: rash, swelling[  ];, hair loss[  ];  peripheral edema[  ];  or itching[  ]; Musculosketetal: myalgias[  ];  joint swelling[  ];  joint erythema[  ];  joint pain[  ];  back pain[  ];  Heme/Lymph: bruising[  ];  bleeding[  ];  anemia[  ];  Neuro: TIA[  ];  headaches[  ];  stroke[  ];  vertigo[ y ];  seizures[  ];   paresthesias[  ];  difficulty walking[  ];  Psych:depression[  ]; anxiety[  ];  Endocrine: diabetes[ y on metformin];  thyroid dysfunction[  ];           Physical Exam: BP 135/75   Pulse (!) 59   Temp (!) 97.5 F (36.4 C) (Oral)   Resp 18   Ht 5\' 2"  (1.575 m)   Wt 139 lb 3.2 oz (63.1 kg) Comment: scale b  SpO2 99%   BMI 25.46 kg/m        Physical Exam  General: Small Asian male accompanied by family no acute distress HEENT: Normocephalic pupils equal , dentition adequate Neck:  Supple without JVD, adenopathy, loud left carotid bruit and bruit over left subclavian artery  Chest: Clear to auscultation, symmetrical breath sounds, no rhonchi, no tenderness             or deformity Cardiovascular: Regular rate and rhythm, no murmur, no gallop, peripheral pulses  palpable in all extremities Abdomen:  Soft, nontender, no palpable mass or organomegaly Extremities: Warm, well-perfused, no clubbing cyanosis edema or tenderness,              no venous stasis changes of the legs Rectal/GU: Deferred Neuro: Grossly non--focal and symmetrical throughout Skin: Clean and dry without rash or ulceration   Diagnostic Studies & Laboratory data:     Recent Radiology Findings:   Dg Chest 2 View  Result Date: 02/27/2018 CLINICAL DATA:  Chest pain EXAM: CHEST - 2 VIEW COMPARISON:  None. FINDINGS: The heart size and mediastinal contours are within normal limits. Both lungs are clear. Mild degenerative changes of the spine. IMPRESSION: No active cardiopulmonary disease. Electronically Signed   By: Jasmine Pang M.D.   On: 02/27/2018 23:20     I have independently reviewed the above radiologic studies and discussed with the patient   Recent Lab Findings: Lab Results  Component Value Date   WBC 8.0 02/27/2018   HGB 13.3 02/27/2018   HCT 41.3 02/27/2018   PLT 240 02/27/2018   GLUCOSE 133 (H) 03/01/2018   CHOL 200 02/28/2018   TRIG 273 (H) 02/28/2018   HDL 33 (L) 02/28/2018   LDLCALC 112 (H) 02/28/2018   NA 140 03/01/2018   K 4.1 03/01/2018   CL 109 03/01/2018   CREATININE 0.81 03/01/2018   BUN 8 03/01/2018   CO2 24 03/01/2018   INR 1.00 02/28/2018      Assessment / Plan:   Diabetes, severe three-vessel coronary disease, non-STEMI Fairly well preserved LV systolic function with inferior wall hypokinesia Mild-moderate mitral regurgitation. Left subclavian stenosis, 60% Left carotid bruit   Patient would benefit from surgical coronary catheterization and I  agree with the patient's cardiologist recommendation for CABG.  With  proximal left subclavian stenosis and possible endovascular stenting of the vessel in the future I would recommend free mammary artery graft to the LAD.  Other targets at the time of surgery would include posterior descending, diagonal, obtuse marginal.  I discussed the procedure of CABG in detail with the patient and his family including indications and expected benefits, expected hospital recovery and potential complications and risks as well as alternatives to surgery.  Patient appears to understand, all questions were addressed and he agrees to proceed with surgery.  Prior to surgery the results of his carotid Dopplers and extremity Doppler ultrasound exam will be reviewed.   @ME1 @ 03/01/2018 3:28 PM

## 2018-03-01 NOTE — Progress Notes (Addendum)
   Severe three-vessel coronary disease (totally occluded mid RCA with left-to-right collaterals), high-grade obstruction and first obtuse marginal segmental 70 to 80% % proximal to mid LAD with FFR 0.73.  Left subclavian 70% obstructed, with greater than 60 mm gradient.  Mammary is widely patent.  Vertebral with high-grade obstruction.  Please see the text of the cath report.  TCTS has been notified.  Discussion relative to subclavian stenosis and management strategy should occur preop.

## 2018-03-01 NOTE — Progress Notes (Signed)
Heparin restarted per order. No bleeding noted at right radial site. Patient educated on when to call RN. Patient verbalized understanding of education. No Other complaints st this time. Will continue to monitor patient.

## 2018-03-01 NOTE — Progress Notes (Addendum)
Pre-op Cardiac Surgery  Carotid Findings: Findings suggest 60-79% right internal carotid artery stenosis and 40-59% left internal carotid artery stenosis. The right ECA exhibits >50% stenosis. The right vertebral artery is patent with antegrade flow. 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 The left subclavian artery exhibits hemodynamically significant stenosis.  Upper Extremity Right Left  Brachial Pressures Unable to obtain pressure due to restricted limb- Triphasic 111-Triphasic  Radial Waveforms Triphasic Triphasic  Ulnar Waveforms Triphasic Triphasic  Palmar Arch (Allen's Test) Not completed due to TR band. Within normal limits    Lower  Extremity Right Left  Dorsalis Pedis 155-Triphasic 133-Triphasic  Anterior Tibial    Posterior Tibial 155-Triphasic 153-Triphasic  Ankle/Brachial Indices 1.40 1.38    Findings:   ABIs are elevated at rest bilaterally, suggestive of medial calcification. Pedal waveforms are within normal limits bilaterally.   Preliminary results discussed with Alycia RossettiRyan of TCTS.  03/01/2018 4:21 PM Gertie FeyMichelle Talulah Schirmer, BS, RVT, RDCS, RDMS

## 2018-03-01 NOTE — Progress Notes (Signed)
Pt wants a stool softener and his home dose of finasteride ordered. PA paged, will continue to monitor.

## 2018-03-01 NOTE — Progress Notes (Addendum)
ANTICOAGULATION CONSULT NOTE  Pharmacy Consult for Heparin Indication: chest pain/ACS  Allergies  Allergen Reactions  . Eggs Or Egg-Derived Products Itching and Rash    Patient Measurements: Height: 5\' 2"  (157.5 cm) Weight: 139 lb 3.2 oz (63.1 kg)(scale b) IBW/kg (Calculated) : 54.6   Vital Signs: Temp: 97.7 F (36.5 C) (07/05 0739) Temp Source: Oral (07/05 0739) BP: 127/53 (07/05 0739) Pulse Rate: 50 (07/05 0739)  Labs: Recent Labs    02/27/18 2257 02/28/18 0346 02/28/18 0612 02/28/18 0935 02/28/18 1434 02/28/18 1541 03/01/18 0458  HGB 13.3  --   --   --   --   --   --   HCT 41.3  --   --   --   --   --   --   PLT 240  --   --   --   --   --   --   LABPROT  --   --  13.1  --   --   --   --   INR  --   --  1.00  --   --   --   --   HEPARINUNFRC  --   --   --   --  0.98*  --  0.52  CREATININE 0.80 0.83  --   --   --   --   --   TROPONINI  --  0.04*  --  0.07*  --  0.10*  --     Estimated Creatinine Clearance: 69.4 mL/min (by C-G formula based on SCr of 0.83 mg/dL).  Assessment: 62 yo male admitted with substernal chest pain. Pharmacy consulted to manage heparin drip. No anticoagulation PTA.  Heparin level is therapeutic at 0.52 on 800 units/hr. No bleeding noted, CBC is stable. Plan is for cath today.  Goal of Therapy:  Heparin level 0.3-0.7 units/ml Monitor platelets by anticoagulation protocol: Yes   Plan:  Continue heparin drip at 800 units/hr Daily heparin level, CBC Monitor for s/sx of bleeding F/U after cath today   Loura BackJennifer [place], PharmD, BCPS Clinical Pharmacist Clinical phone for 03/01/2018 until 3p is [phone] Please check AMION for all Pharmacist numbers by unit 03/01/2018 9:40 AM    Addendum: S/p cath with severe 3V CAD - TCTS consulted for CABG. Pharmacy consulted to resume heparin drip 8 hrs after sheath removed. Sheath removed ~11:30.  Resume heparin drip at 800 units/hr with no bolus at 19:30 8 hr heparin level  Partridge HouseJennifer [place],  PharmD, BCPS 1:12 PM

## 2018-03-01 NOTE — Interval H&P Note (Signed)
History and Physical Interval Note:  03/01/2018 10:31 AM  Carollee MassedGurdev Thedore MinsSingh  has presented today for surgery, with the diagnosis of NSTEMI  The various methods of treatment have been discussed with the patient and family. After consideration of risks, benefits and other options for treatment, the patient has consented to  Procedure(s): LEFT HEART CATH AND CORONARY ANGIOGRAPHY (N/A) as a surgical intervention .  The patient's history has been reviewed, patient examined, no change in status, stable for surgery.  I have reviewed the patient's chart and labs.  Questions were answered to the patient's satisfaction.   Cath Lab Visit (complete for each Cath Lab visit)  Clinical Evaluation Leading to the Procedure:   ACS: Yes.    Non-ACS:    Anginal Classification: CCS IV  Anti-ischemic medical therapy: Minimal Therapy (1 class of medications)  Non-Invasive Test Results: No non-invasive testing performed  Prior CABG: No previous CABG        Theron Aristaeter [facility] Surgical Center LLCJordanMD,FACC 03/01/2018 10:31 AM

## 2018-03-01 NOTE — Progress Notes (Signed)
   Called by nurse for patient reporting chest burning/tingling. Admitted with NSTEMI, troponin 0.10. On for cath at 11. EKG shows hyperacute TW in V2, with TWI in III, avF. The hyperacute TW is new but inferior changes have persisted this admission. Ordered for nurse to give 1 SL NTG which nurse reports gave relief. I spoke to cath lab to facilitate patient going to cath as next case given recurrent symptoms - they will be calling for next. Given the peaked appearance of TW in V2 will also order BMET and f/u troponin. Dayna Dunn PA-C

## 2018-03-01 NOTE — Progress Notes (Signed)
Patient does not want to be shaved at this time. Wants to wait to have it done in cath lab. No other complaints at this time. Will continue to monitor patient.

## 2018-03-01 NOTE — Progress Notes (Signed)
Progress Note  Patient Name: Nicholas BignessGurdev [name] Date of Encounter: 03/01/2018  Primary Cardiologist: Dina RichJonathan Branch, MD  Subjective   Chest discomfort this morning.  Currently in Cath Lab.  No current chest pain.  Inpatient Medications    Scheduled Meds: . aspirin EC  81 mg Oral QHS  . atenolol  50 mg Oral Daily  . atorvastatin  80 mg Oral q1800  . insulin aspart  0-9 Units Subcutaneous TID WC  . lisinopril  2.5 mg Oral QHS  . sodium chloride flush  3 mL Intravenous Q12H  . sodium chloride flush  3 mL Intravenous Q12H   Continuous Infusions: . sodium chloride    . sodium chloride    . sodium chloride 1 mL/kg/hr (03/01/18 0926)  . heparin 800 Units/hr (03/01/18 0928)   PRN Meds: sodium chloride, sodium chloride, acetaminophen, nitroGLYCERIN, ondansetron (ZOFRAN) IV, sodium chloride flush, sodium chloride flush   Vital Signs    Vitals:   02/28/18 2209 03/01/18 0506 03/01/18 0739 03/01/18 0959  BP: 128/72 124/68 (!) 127/53 120/66  Pulse: (!) 54 (!) 52 (!) 50 (!) 52  Resp:  18 18   Temp:  97.6 F (36.4 C) 97.7 F (36.5 C)   TempSrc:  Oral Oral   SpO2:  98% 98%   Weight:  139 lb 3.2 oz (63.1 kg)    Height:        Intake/Output Summary (Last 24 hours) at 03/01/2018 1007 Last data filed at 03/01/2018 1003 Gross per 24 hour  Intake 205.58 ml  Output 441 ml  Net -235.42 ml   Filed Weights   02/28/18 0214 02/28/18 1218 03/01/18 0506  Weight: 145 lb (65.8 kg) 145 lb 1 oz (65.8 kg) 139 lb 3.2 oz (63.1 kg)    Telemetry    Normal sinus rhythm without ectopy- Personally Reviewed  ECG    Sinus bradycardia, prominent T waves with mild ST elevation in lead V2.  Inferior T wave inversion, nonspecific.  No evolutionary change since admission.- Personally Reviewed  Physical Exam  No distress.  Gray beard. GEN: No acute distress.   Neck: No JVD Cardiac: RRR, no murmurs, rubs, or gallops.  Respiratory: Clear to auscultation bilaterally. GI: Soft, nontender, non-distended    MS: No edema; No deformity. Neuro:  Nonfocal  Psych: Normal affect   Labs    Chemistry Recent Labs  Lab 02/27/18 2257 02/28/18 0346  NA 136  --   K 4.0  --   CL 106  --   CO2 23  --   GLUCOSE 213*  --   BUN 14  --   CREATININE 0.80 0.83  CALCIUM 8.8*  --   GFRNONAA >60 >60  GFRAA >60 >60  ANIONGAP 7  --      Hematology Recent Labs  Lab 02/27/18 2257  WBC 8.0  RBC 4.73  HGB 13.3  HCT 41.3  MCV 87.3  MCH 28.1  MCHC 32.2  RDW 13.3  PLT 240    Cardiac Enzymes Recent Labs  Lab 02/28/18 0346 02/28/18 0935 02/28/18 1541  TROPONINI 0.04* 0.07* 0.10*    Recent Labs  Lab 02/27/18 2308 02/28/18 0156  TROPIPOC 0.01 0.01     BNPNo results for input(s): BNP, PROBNP in the last 168 hours.   DDimer No results for input(s): DDIMER in the last 168 hours.   Radiology    Dg Chest 2 View  Result Date: 02/27/2018 CLINICAL DATA:  Chest pain EXAM: CHEST - 2 VIEW COMPARISON:  None. FINDINGS: The  heart size and mediastinal contours are within normal limits. Both lungs are clear. Mild degenerative changes of the spine. IMPRESSION: No active cardiopulmonary disease. Electronically Signed   By: Jasmine Pang M.D.   On: 02/27/2018 23:20    Cardiac Studies   2D Doppler echocardiogram 02/28/2018: ------------------------------------------------------------------- Study Conclusions   - Left ventricle: The cavity size was normal. Wall thickness was   normal. Systolic function was normal. The estimated ejection   fraction was in the range of 60% to 65%. Probable hypokinesis of   the basalinferior myocardium. Indeterminate diastolic function. - Mitral valve: There was mild to moderate regurgitation directed   posteriorly - not well seen. - Right atrium: Central venous pressure (est): 3 mm Hg. - Atrial septum: No defect or patent foramen ovale was identified. - Tricuspid valve: There was trivial regurgitation. - Pulmonary arteries: Systolic pressure could not be accurately    estimated. - Pericardium, extracardiac: There was no pericardial effusion.   Patient Profile     62 y.o. male with type 2 diabetes, hypertension, hyperlipidemia, known vascular obstructive disease (subclavian stenosis) who presented with non-ST elevation myocardial infarction/unstable angina on 02/28/2018.  Assessment & Plan    1. NSTEACS -patient elevation in enzymes.  Significant risk factors.  Several months duration of symptoms.  May have multivessel disease.  Agree with cath today. 2. Hyperlipidemia with hypertriglyceridemia.  High intensity statin therapy.  And consider adding icosapent. 3. Type 2 diabetes mellitus with hemoglobin A1c get evaluated.  Diabetes not complicated by vascular disease.  Consider SGL T2P.  Further recommendations pending findings of cath today.  For questions or updates, please contact [facility] Please consult [email or web address] for contact info under Cardiology/STEMI.      Signed, Lesleigh Noe, MD  03/01/2018, 10:07 AM

## 2018-03-02 DIAGNOSIS — I771 Stricture of artery: Secondary | ICD-10-CM

## 2018-03-02 DIAGNOSIS — I25118 Atherosclerotic heart disease of native coronary artery with other forms of angina pectoris: Secondary | ICD-10-CM

## 2018-03-02 DIAGNOSIS — I214 Non-ST elevation (NSTEMI) myocardial infarction: Principal | ICD-10-CM

## 2018-03-02 LAB — COMPREHENSIVE METABOLIC PANEL
ALT: 20 U/L (ref 0–44)
AST: 7 U/L — ABNORMAL LOW (ref 15–41)
Albumin: 3 g/dL — ABNORMAL LOW (ref 3.5–5.0)
Alkaline Phosphatase: 59 U/L (ref 38–126)
Anion gap: 8 (ref 5–15)
BUN: 7 mg/dL — ABNORMAL LOW (ref 8–23)
CO2: 24 mmol/L (ref 22–32)
Calcium: 8.6 mg/dL — ABNORMAL LOW (ref 8.9–10.3)
Chloride: 108 mmol/L (ref 98–111)
Creatinine, Ser: 0.82 mg/dL (ref 0.61–1.24)
GFR calc Af Amer: >60 mL/min (ref >60)
GFR calc non Af Amer: >60 mL/min (ref >60)
Glucose, Bld: 134 mg/dL — ABNORMAL HIGH (ref 70–99)
Potassium: 4 mmol/L (ref 3.5–5.1)
Sodium: 140 mmol/L (ref 135–145)
Total Bilirubin: 0.5 mg/dL (ref 0.3–1.2)
Total Protein: 5.6 g/dL — ABNORMAL LOW (ref 6.5–8.1)

## 2018-03-02 LAB — CBC
HEMATOCRIT: 36.2 % — AB (ref 39.0–52.0)
HEMOGLOBIN: 11.9 g/dL — AB (ref 13.0–17.0)
MCH: 28.2 pg (ref 26.0–34.0)
MCHC: 32.9 g/dL (ref 30.0–36.0)
MCV: 85.8 fL (ref 78.0–100.0)
Platelets: 201 10*3/uL (ref 150–400)
RBC: 4.22 MIL/uL (ref 4.22–5.81)
RDW: 13.3 % (ref 11.5–15.5)
WBC: 7 10*3/uL (ref 4.0–10.5)

## 2018-03-02 LAB — GLUCOSE, CAPILLARY
GLUCOSE-CAPILLARY: 156 mg/dL — AB (ref 70–99)
Glucose-Capillary: 227 mg/dL — ABNORMAL HIGH (ref 70–99)
Glucose-Capillary: 129 mg/dL — ABNORMAL HIGH (ref 70–99)
Glucose-Capillary: 141 mg/dL — ABNORMAL HIGH (ref 70–99)

## 2018-03-02 LAB — SURGICAL PCR SCREEN
MRSA, PCR: NEGATIVE
Staphylococcus aureus: POSITIVE — AB

## 2018-03-02 LAB — PROTIME-INR
INR: 1.01
Prothrombin Time: 13.2 seconds (ref 11.4–15.2)

## 2018-03-02 LAB — HEPARIN LEVEL (UNFRACTIONATED)
Heparin Unfractionated: 0.24 IU/mL — ABNORMAL LOW (ref 0.30–0.70)
Heparin Unfractionated: 0.48 IU/mL (ref 0.30–0.70)

## 2018-03-02 LAB — TSH: TSH: 1.15 u[IU]/mL (ref 0.350–4.500)

## 2018-03-02 LAB — HEMOGLOBIN A1C
Hgb A1c MFr Bld: 7.6 % — ABNORMAL HIGH (ref 4.8–5.6)
Mean Plasma Glucose: 171.42 mg/dL

## 2018-03-02 MED ORDER — INSULIN GLARGINE 100 UNIT/ML ~~LOC~~ SOLN
12.0000 [IU] | Freq: Two times a day (BID) | SUBCUTANEOUS | Status: DC
  Administered 2018-03-02 – 2018-03-03 (×4): 12 [IU] via SUBCUTANEOUS
  Filled 2018-03-02 (×5): qty 0.12

## 2018-03-02 MED ORDER — INSULIN ASPART 100 UNIT/ML ~~LOC~~ SOLN: 0.0000 [IU] | Freq: Every day | SUBCUTANEOUS | Status: DC

## 2018-03-02 MED ORDER — INSULIN ASPART 100 UNIT/ML ~~LOC~~ SOLN
0.0000 [IU] | Freq: Three times a day (TID) | SUBCUTANEOUS | Status: DC
  Administered 2018-03-02 (×2): 2 [IU] via SUBCUTANEOUS
  Administered 2018-03-03: 7 [IU] via SUBCUTANEOUS
  Administered 2018-03-03: 11 [IU] via SUBCUTANEOUS

## 2018-03-02 NOTE — Progress Notes (Signed)
ANTICOAGULATION CONSULT NOTE - Follow Up Consult  Pharmacy Consult for heparin dosing Indication: chest pain/ACS, awaiting CABG  Allergies  Allergen Reactions  . Eggs Or Egg-Derived Products Itching and Rash    Patient Measurements: Height: 5\' 2"  (157.5 cm) Weight: 140 lb 12.8 oz (63.9 kg)(scale b) IBW/kg (Calculated) : 54.6 Heparin Dosing Weight: 65.8  Vital Signs: Temp: 98.1 F (36.7 C) (07/06 1216) Temp Source: Oral (07/06 1216) BP: 104/65 (07/06 1216) Pulse Rate: 55 (07/06 1216)  Labs: Recent Labs    02/27/18 2257  02/28/18 0346 02/28/18 0612 02/28/18 0935  02/28/18 1541 03/01/18 0458 03/01/18 1016 03/02/18 0255 03/02/18 0303 03/02/18 1221  HGB 13.3  --   --   --   --   --   --   --   --  11.9*  --   --   HCT 41.3  --   --   --   --   --   --   --   --  36.2*  --   --   PLT 240  --   --   --   --   --   --   --   --  201  --   --   LABPROT  --   --   --  13.1  --   --   --   --   --  13.2  --   --   INR  --   --   --  1.00  --   --   --   --   --  1.01  --   --   HEPARINUNFRC  --   --   --   --   --    < >  --  0.52  --   --  0.24* 0.48  CREATININE 0.80  --  0.83  --   --   --   --   --  0.81 0.82  --   --   TROPONINI  --    < > 0.04*  --  0.07*  --  0.10*  --  0.11*  --   --   --    < > = values in this interval not displayed.    Estimated Creatinine Clearance: 70.3 mL/min (by C-G formula based on SCr of 0.82 mg/dL).   Medications:  Scheduled:  . aspirin EC  81 mg Oral QHS  . atenolol  50 mg Oral Daily  . atorvastatin  80 mg Oral q1800  . finasteride  5 mg Oral Daily  . insulin aspart  0-15 Units Subcutaneous TID WC  . insulin aspart  0-5 Units Subcutaneous QHS  . insulin glargine  12 Units Subcutaneous BID  . isosorbide mononitrate  30 mg Oral Daily  . lisinopril  2.5 mg Oral QHS  . sodium chloride flush  3 mL Intravenous Q12H  . sodium chloride flush  3 mL Intravenous Q12H   Infusions:  . sodium chloride    . sodium chloride    . heparin 950  Units/hr (03/02/18 [id number])    Assessment: 62 yo male admitted with substernal chest pain. Pharmacy consulted to manage heparin drip. Patient not on anticoagulation prior to admission. Patient planning for CABG.  Goal of Therapy:  Heparin level 0.3-0.7 units/ml Monitor platelets by anticoagulation protocol: Yes   Plan:  Continue heparin 950 units/hr Continue to monitor H&H and platelets   Otis Peakebecca M Carissa Musick 03/02/2018,1:50 PM

## 2018-03-02 NOTE — Progress Notes (Signed)
CARDIAC REHAB PHASE I   Upon arrival to floor, pt noted to be walking independently with daughter, c/o some chest tightness. HR 96. Pt and daughters given IS, pt demonstrated 1000. Pt needs reinforcement on use. Pt given OHS booklet along with in-the-tube sheet and preparing for OHS. Pt and daughters declined pre-op video, instructions left on how to view should they change their mind. Pt and family understands importance of need for 24/7 care post d/c. Pt and family asked appropriate questions. Will continue to follow.  [phone] Reynold Boweneresa  Yoon Barca, RN BSN 03/02/2018 10:02 AM

## 2018-03-02 NOTE — Progress Notes (Signed)
Progress Note  Patient Name: Nicholas [name] Date of Encounter: 03/02/2018  Primary Cardiologist: Dr. Wyline Mood  Subjective   Has mild chest pressure.  Denies palpitations and shortness of breath.  2 daughters present in the room with questions regarding upcoming surgery.  Inpatient Medications    Scheduled Meds: . aspirin EC  81 mg Oral QHS  . atenolol  50 mg Oral Daily  . atorvastatin  80 mg Oral q1800  . finasteride  5 mg Oral Daily  . insulin aspart  0-9 Units Subcutaneous TID WC  . isosorbide mononitrate  30 mg Oral Daily  . lisinopril  2.5 mg Oral QHS  . sodium chloride flush  3 mL Intravenous Q12H  . sodium chloride flush  3 mL Intravenous Q12H   Continuous Infusions: . sodium chloride    . sodium chloride    . heparin 950 Units/hr (03/02/18 0644)   PRN Meds: sodium chloride, sodium chloride, acetaminophen, docusate sodium, nitroGLYCERIN, ondansetron (ZOFRAN) IV, sodium chloride flush, sodium chloride flush   Vital Signs    Vitals:   03/01/18 1623 03/01/18 2016 03/01/18 2031 03/02/18 0447  BP: 106/65 124/72 105/71 102/68  Pulse: 61 (!) 50 (!) 59 61  Resp: 18 17  18   Temp: 97.7 F (36.5 C) 98 F (36.7 C)  97.8 F (36.6 C)  TempSrc: Oral Oral  Oral  SpO2: 95% 97%  96%  Weight:    140 lb 12.8 oz (63.9 kg)  Height:        Intake/Output Summary (Last 24 hours) at 03/02/2018 0824 Last data filed at 03/02/2018 0644 Gross per 24 hour  Intake 329.88 ml  Output 1001 ml  Net -671.12 ml   Filed Weights   02/28/18 1218 03/01/18 0506 03/02/18 0447  Weight: 145 lb 1 oz (65.8 kg) 139 lb 3.2 oz (63.1 kg) 140 lb 12.8 oz (63.9 kg)    Telemetry    Sinus rhythm without ectopy- Personally Reviewed  ECG    No new tracings- Personally Reviewed  Physical Exam   GEN: No acute distress.   Neck: No JVD Cardiac: RRR, no murmurs, rubs, or gallops.  Respiratory: Clear to auscultation bilaterally. GI: Soft, nontender, non-distended  MS: No edema; No deformity. Neuro:   Nonfocal  Psych: Normal affect   Labs    Chemistry Recent Labs  Lab 02/27/18 2257 02/28/18 0346 03/01/18 1016 03/02/18 0255  NA 136  --  140 140  K 4.0  --  4.1 4.0  CL 106  --  109 108  CO2 23  --  24 24  GLUCOSE 213*  --  133* 134*  BUN 14  --  8 7*  CREATININE 0.80 0.83 0.81 0.82  CALCIUM 8.8*  --  8.4* 8.6*  PROT  --   --   --  5.6*  ALBUMIN  --   --   --  3.0*  AST  --   --   --  7*  ALT  --   --   --  20  ALKPHOS  --   --   --  59  BILITOT  --   --   --  0.5  GFRNONAA >60 >60 >60 >60  GFRAA >60 >60 >60 >60  ANIONGAP 7  --  7 8     Hematology Recent Labs  Lab 02/27/18 2257 03/02/18 0255  WBC 8.0 7.0  RBC 4.73 4.22  HGB 13.3 11.9*  HCT 41.3 36.2*  MCV 87.3 85.8  MCH 28.1 28.2  MCHC 32.2 32.9  RDW 13.3 13.3  PLT 240 201    Cardiac Enzymes Recent Labs  Lab 02/28/18 0346 02/28/18 0935 02/28/18 1541 03/01/18 1016  TROPONINI 0.04* 0.07* 0.10* 0.11*    Recent Labs  Lab 02/27/18 2308 02/28/18 0156  TROPIPOC 0.01 0.01     BNPNo results for input(s): BNP, PROBNP in the last 168 hours.   DDimer No results for input(s): DDIMER in the last 168 hours.   Radiology    No results found.  Cardiac Studies   Cardiac catheterization 03/01/2018:   Prox LAD lesion is 70% stenosed.  1st Diag lesion is 70% stenosed.  Ramus lesion is 70% stenosed.  Ost 1st Mrg lesion is 95% stenosed.  1st Mrg lesion is 60% stenosed with 60% stenosed side branch in Lat 1st Mrg.  Mid RCA lesion is 100% stenosed.  LV end diastolic pressure is normal.   1. Severe 3 vessel obstructive CAD    - 70% proximal LAD with abnormal FFR of 0.73.    - 95% proximal large OM1    - 100% mid RCA with left to right collaterals. 2. Normal LVEDP 3. 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 4. Severe ostial left vertebral stenosis.   Plan: Recommend CABG for coronary revascularization. The subclavian stenosis is an issue and may compromise flow in the  mammary artery long term. I discussed with Dr. Allyson SabalBerry and he feels the subclavian stenosis can be stented but would require DAPT. Given patient's unstable coronary presentation it may be best to proceed with CABG. There appears to be enough flow to support using the LIMA as a pedicle graft. 3-4 weeks post op the patient could go on DAPT and stent the left subclavian. Will need to get surgical opinion and co-ordinate with them. Dr. Allyson SabalBerry would be available for the subclavian stent procedure.    Echocardiogram 02/28/2018:  Study Conclusions  - Left ventricle: The cavity size was normal. Wall thickness was   normal. Systolic function was normal. The estimated ejection   fraction was in the range of 60% to 65%. Probable hypokinesis of   the basalinferior myocardium. Indeterminate diastolic function. - Mitral valve: There was mild to moderate regurgitation directed   posteriorly - not well seen. - Right atrium: Central venous pressure (est): 3 mm Hg. - Atrial septum: No defect or patent foramen ovale was identified. - Tricuspid valve: There was trivial regurgitation. - Pulmonary arteries: Systolic pressure could not be accurately   estimated. - Pericardium, extracardiac: There was no pericardial effusion.   Patient Profile     62 y.o. male with type 2 diabetes, hypertension, hyperlipidemia, known vascular obstructive disease (subclavian stenosis) who presented with non-ST elevation myocardial infarction/unstable angina on 02/28/2018.  Assessment & Plan    1.  Non-ST elevation MI with three-vessel coronary disease and left subclavian artery stenosis: Symptomatically stable overall.  Currently on aspirin, heparin, nitrates, beta-blocker, and lisinopril.  Plan is for CABG this upcoming Monday.  Hemodynamically stable.  2.  Hypertension: Blood pressures controlled.  No change to therapy.  He is on lisinopril and atenolol.  3.  Hyperlipidemia: Currently on Lipitor 80 mg.  LDL 112 on 02/28/2018.  4.   Left subclavian artery stenosis: Plan for endovascular stenting in the future.   For questions or updates, please contact [facility] Please consult [email or web address] for contact info under Cardiology/STEMI.      Signed, Prentice DockerSuresh Koneswaran, MD  03/02/2018, 8:24 AM

## 2018-03-02 NOTE — Progress Notes (Signed)
ANTICOAGULATION CONSULT NOTE  Pharmacy Consult for Heparin Indication: chest pain/ACS, awaiting CABG  Allergies  Allergen Reactions  . Eggs Or Egg-Derived Products Itching and Rash   Patient Measurements: Height: 5\' 2"  (157.5 cm) Weight: 139 lb 3.2 oz (63.1 kg)(scale b) IBW/kg (Calculated) : 54.6   Vital Signs: Temp: 98 F (36.7 C) (07/05 2016) Temp Source: Oral (07/05 2016) BP: 105/71 (07/05 2031) Pulse Rate: 59 (07/05 2031)  Labs: Recent Labs    02/27/18 2257  02/28/18 0346 02/28/18 0612 02/28/18 0935 02/28/18 1434 02/28/18 1541 03/01/18 0458 03/01/18 1016 03/02/18 0255 03/02/18 0303  HGB 13.3  --   --   --   --   --   --   --   --  11.9*  --   HCT 41.3  --   --   --   --   --   --   --   --  36.2*  --   PLT 240  --   --   --   --   --   --   --   --  201  --   LABPROT  --   --   --  13.1  --   --   --   --   --  13.2  --   INR  --   --   --  1.00  --   --   --   --   --  1.01  --   HEPARINUNFRC  --   --   --   --   --  0.98*  --  0.52  --   --  0.24*  CREATININE 0.80  --  0.83  --   --   --   --   --  0.81  --   --   TROPONINI  --    < > 0.04*  --  0.07*  --  0.10*  --  0.11*  --   --    < > = values in this interval not displayed.    Estimated Creatinine Clearance: 71.2 mL/min (by C-G formula based on SCr of 0.81 mg/dL).  Assessment: 62 yo male admitted with substernal chest pain. Pharmacy consulted to manage heparin drip. No anticoagulation PTA.   Planning for CABG  7/6 AM update: heparin level sub-therapeutic this AM, no issues per RN.   Goal of Therapy:  Heparin level 0.3-0.7 units/ml Monitor platelets by anticoagulation protocol: Yes   Plan:  Inc heparin to 950 units/hr 1200 HL  Abran DukeJames Breyden Jeudy, PharmD, BCPS Clinical Pharmacist Phone: [phone]

## 2018-03-03 LAB — GLUCOSE, CAPILLARY
GLUCOSE-CAPILLARY: 320 mg/dL — AB (ref 70–99)
GLUCOSE-CAPILLARY: 146 mg/dL — AB (ref 70–99)
Glucose-Capillary: 85 mg/dL (ref 70–99)
Glucose-Capillary: 147 mg/dL — ABNORMAL HIGH (ref 70–99)

## 2018-03-03 LAB — BLOOD GAS, ARTERIAL
Acid-base deficit: 1 mmol/L (ref 0.0–2.0)
Bicarbonate: 22.9 mmol/L (ref 20.0–28.0)
Drawn by: 270221
O2 Saturation: 95.6 %
Patient temperature: 98.6
pCO2 arterial: 36.8 mmHg (ref 32.0–48.0)
pH, Arterial: 7.411 (ref 7.350–7.450)
pO2, Arterial: 80.2 mmHg — ABNORMAL LOW (ref 83.0–108.0)

## 2018-03-03 LAB — CBC
HCT: 38 % — ABNORMAL LOW (ref 39.0–52.0)
HEMOGLOBIN: 12.7 g/dL — AB (ref 13.0–17.0)
MCH: 28.7 pg (ref 26.0–34.0)
MCHC: 33.4 g/dL (ref 30.0–36.0)
MCV: 85.8 fL (ref 78.0–100.0)
PLATELETS: 216 10*3/uL (ref 150–400)
RBC: 4.43 MIL/uL (ref 4.22–5.81)
RDW: 13.3 % (ref 11.5–15.5)
WBC: 7.4 10*3/uL (ref 4.0–10.5)

## 2018-03-03 LAB — PREPARE RBC (CROSSMATCH)

## 2018-03-03 LAB — ABO/RH: ABO/RH(D): O POS

## 2018-03-03 LAB — HEPARIN LEVEL (UNFRACTIONATED): HEPARIN UNFRACTIONATED: 0.48 [IU]/mL (ref 0.30–0.70)

## 2018-03-03 MED ORDER — PLASMA-LYTE 148 IV SOLN
INTRAVENOUS | Status: DC
  Filled 2018-03-03: qty 2.5

## 2018-03-03 MED ORDER — DOPAMINE-DEXTROSE 3.2-5 MG/ML-% IV SOLN
0.0000 ug/kg/min | INTRAVENOUS | Status: DC
  Filled 2018-03-03: qty 250

## 2018-03-03 MED ORDER — DIAZEPAM 2 MG PO TABS
2.0000 mg | ORAL_TABLET | Freq: Once | ORAL | Status: AC
  Administered 2018-03-04: 2 mg via ORAL
  Filled 2018-03-03: qty 1

## 2018-03-03 MED ORDER — MAGNESIUM SULFATE 50 % IJ SOLN
40.0000 meq | INTRAMUSCULAR | Status: DC
  Filled 2018-03-03 (×2): qty 9.85

## 2018-03-03 MED ORDER — MUPIROCIN 2 % EX OINT
TOPICAL_OINTMENT | Freq: Two times a day (BID) | CUTANEOUS | Status: DC
  Administered 2018-03-03 – 2018-03-04 (×2): 1 via NASAL
  Administered 2018-03-05 – 2018-03-09 (×8): via NASAL
  Filled 2018-03-03 (×4): qty 22

## 2018-03-03 MED ORDER — POTASSIUM CHLORIDE 2 MEQ/ML IV SOLN
80.0000 meq | INTRAVENOUS | Status: DC
  Filled 2018-03-03: qty 40

## 2018-03-03 MED ORDER — METOPROLOL TARTRATE 12.5 MG HALF TABLET: 12.5000 mg | ORAL_TABLET | Freq: Once | ORAL | Status: DC

## 2018-03-03 MED ORDER — CHLORHEXIDINE GLUCONATE 4 % EX LIQD
60.0000 mL | Freq: Once | CUTANEOUS | Status: AC
  Administered 2018-03-03: 4 via TOPICAL
  Filled 2018-03-03: qty 60

## 2018-03-03 MED ORDER — EPINEPHRINE PF 1 MG/ML IJ SOLN
0.0000 ug/min | INTRAVENOUS | Status: DC
  Filled 2018-03-03: qty 4

## 2018-03-03 MED ORDER — SODIUM CHLORIDE 0.9 % IV SOLN
1.5000 mg/kg/h | INTRAVENOUS | Status: AC
  Administered 2018-03-04: 1.5 mg/kg/h via INTRAVENOUS
  Filled 2018-03-03: qty 25

## 2018-03-03 MED ORDER — CHLORHEXIDINE GLUCONATE 4 % EX LIQD
60.0000 mL | Freq: Once | CUTANEOUS | Status: AC
  Administered 2018-03-04: 4 via TOPICAL
  Filled 2018-03-03: qty 60

## 2018-03-03 MED ORDER — TRANEXAMIC ACID (OHS) PUMP PRIME SOLUTION
2.0000 mg/kg | INTRAVENOUS | Status: DC
  Filled 2018-03-03: qty 1.26

## 2018-03-03 MED ORDER — CHLORHEXIDINE GLUCONATE 0.12 % MT SOLN
15.0000 mL | Freq: Once | OROMUCOSAL | Status: AC
  Administered 2018-03-04: 15 mL via OROMUCOSAL
  Filled 2018-03-03: qty 15

## 2018-03-03 MED ORDER — DEXMEDETOMIDINE HCL IN NACL 400 MCG/100ML IV SOLN
0.1000 ug/kg/h | INTRAVENOUS | Status: AC
  Administered 2018-03-04: .4 ug/kg/h via INTRAVENOUS
  Filled 2018-03-03: qty 100

## 2018-03-03 MED ORDER — BISACODYL 5 MG PO TBEC
5.0000 mg | DELAYED_RELEASE_TABLET | Freq: Once | ORAL | Status: AC
  Administered 2018-03-03: 5 mg via ORAL
  Filled 2018-03-03: qty 1

## 2018-03-03 MED ORDER — SODIUM CHLORIDE 0.9 % IV SOLN
INTRAVENOUS | Status: DC
  Filled 2018-03-03: qty 30

## 2018-03-03 MED ORDER — CEFUROXIME SODIUM 1.5 G IV SOLR
1.5000 g | INTRAVENOUS | Status: AC
  Administered 2018-03-04: 1.5 g via INTRAVENOUS
  Filled 2018-03-03: qty 1.5

## 2018-03-03 MED ORDER — SODIUM CHLORIDE 0.9 % IV SOLN
750.0000 mg | INTRAVENOUS | Status: AC
  Administered 2018-03-04: .75 g via INTRAVENOUS
  Filled 2018-03-03: qty 750

## 2018-03-03 MED ORDER — PHENYLEPHRINE HCL 10 MG/ML IJ SOLN
30.0000 ug/min | INTRAMUSCULAR | Status: AC
  Administered 2018-03-04: 40 ug/min via INTRAVENOUS
  Filled 2018-03-03: qty 20

## 2018-03-03 MED ORDER — TRANEXAMIC ACID (OHS) BOLUS VIA INFUSION
15.0000 mg/kg | INTRAVENOUS | Status: AC
  Administered 2018-03-04: 942 mg via INTRAVENOUS
  Filled 2018-03-03: qty 942

## 2018-03-03 MED ORDER — ALPRAZOLAM 0.25 MG PO TABS: 0.2500 mg | ORAL_TABLET | ORAL | Status: DC | PRN

## 2018-03-03 MED ORDER — VANCOMYCIN HCL 10 G IV SOLR
1250.0000 mg | INTRAVENOUS | Status: AC
  Administered 2018-03-04: 1250 mg via INTRAVENOUS
  Filled 2018-03-03: qty 1250

## 2018-03-03 MED ORDER — CHLORHEXIDINE GLUCONATE CLOTH 2 % EX PADS: 6.0000 | MEDICATED_PAD | Freq: Every day | CUTANEOUS | Status: DC

## 2018-03-03 MED ORDER — MILRINONE LACTATE IN DEXTROSE 20-5 MG/100ML-% IV SOLN
0.1250 ug/kg/min | INTRAVENOUS | Status: AC
  Administered 2018-03-04: 0.25 ug/kg/min via INTRAVENOUS
  Filled 2018-03-03: qty 100

## 2018-03-03 MED ORDER — TEMAZEPAM 15 MG PO CAPS: 15.0000 mg | ORAL_CAPSULE | Freq: Once | ORAL | Status: DC | PRN

## 2018-03-03 MED ORDER — INSULIN REGULAR HUMAN 100 UNIT/ML IJ SOLN
INTRAMUSCULAR | Status: AC
  Administered 2018-03-04: 1.4 [IU]/h via INTRAVENOUS
  Filled 2018-03-03: qty 1

## 2018-03-03 MED ORDER — NITROGLYCERIN IN D5W 200-5 MCG/ML-% IV SOLN
2.0000 ug/min | INTRAVENOUS | Status: AC
  Administered 2018-03-04: 16.6 ug/min via INTRAVENOUS
  Filled 2018-03-03: qty 250

## 2018-03-03 NOTE — Anesthesia Preprocedure Evaluation (Addendum)
Anesthesia Evaluation  Patient identified by MRN, date of birth, ID band Patient awake    Reviewed: Allergy & Precautions, H&P , NPO status , Patient's Chart, lab work & pertinent test results, reviewed documented beta blocker date and time   Airway Mallampati: II  TM Distance: >3 FB Neck ROM: Full    Dental  (+) Teeth Intact, Dental Advisory Given   Pulmonary neg pulmonary ROS,    Pulmonary exam normal breath sounds clear to auscultation       Cardiovascular Exercise Tolerance: Good hypertension, Pt. on medications and Pt. on home beta blockers + angina + CAD and + Peripheral Vascular Disease  Normal cardiovascular exam+ Valvular Problems/Murmurs MR  Rhythm:Regular Rate:Normal  Echo 02/28/18: Study Conclusions  - Left ventricle: The cavity size was normal. Wall thickness was normal. Systolic function was normal. The estimated ejection fraction was in the range of 60% to 65%. Probable hypokinesis of the basalinferior myocardium. Indeterminate diastolic function. - Mitral valve: There was mild to moderate regurgitation directed posteriorly - not well seen. - Right atrium: Central venous pressure (est): 3 mm Hg. - Atrial septum: No defect or patent foramen ovale was identified. - Tricuspid valve: There was trivial regurgitation. - Pulmonary arteries: Systolic pressure could not be accurately estimated. - Pericardium, extracardiac: There was no pericardial effusion.   Neuro/Psych negative neurological ROS  negative psych ROS   GI/Hepatic negative GI ROS, Neg liver ROS,   Endo/Other  negative endocrine ROSdiabetes, Type 2, Oral Hypoglycemic Agents  Renal/GU negative Renal ROS  negative genitourinary   Musculoskeletal   Abdominal   Peds  Hematology  (+) Blood dyscrasia, anemia ,   Anesthesia Other Findings   Reproductive/Obstetrics negative OB ROS                            Anesthesia  Physical Anesthesia Plan  ASA: IV  Anesthesia Plan: General   Post-op Pain Management:    Induction: Intravenous  PONV Risk Score and Plan: 2 and Midazolam and Treatment may vary due to age or medical condition  Airway Management Planned: Oral ETT  Additional Equipment: Arterial line, CVP, PA Cath, TEE and Ultrasound Guidance Line Placement  Intra-op Plan:   Post-operative Plan: Post-operative intubation/ventilation  Informed Consent: I have reviewed the patients History and Physical, chart, labs and discussed the procedure including the risks, benefits and alternatives for the proposed anesthesia with the patient or authorized representative who has indicated his/her understanding and acceptance.   Dental advisory given  Plan Discussed with: CRNA  Anesthesia Plan Comments:        Anesthesia Quick Evaluation

## 2018-03-03 NOTE — Progress Notes (Signed)
ANTICOAGULATION CONSULT NOTE - Follow Up Consult  Pharmacy Consult for heparin dosing Indication: chest pain/ACS, awaiting CABG  Allergies  Allergen Reactions  . Eggs Or Egg-Derived Products Itching and Rash    Patient Measurements: Height: 5\' 2"  (157.5 cm) Weight: 138 lb 6.4 oz (62.8 kg) IBW/kg (Calculated) : 54.6 Heparin Dosing Weight: 65.8  Vital Signs: Temp: 98.4 F (36.9 C) (07/07 0438) Temp Source: Oral (07/07 0438) BP: 113/74 (07/07 0845) Pulse Rate: 59 (07/07 0839)  Labs: Recent Labs    02/28/18 1541  03/01/18 1016 03/02/18 0255 03/02/18 0303 03/02/18 1221 03/03/18 0756  HGB  --   --   --  11.9*  --   --  12.7*  HCT  --   --   --  36.2*  --   --  38.0*  PLT  --   --   --  201  --   --  216  LABPROT  --   --   --  13.2  --   --   --   INR  --   --   --  1.01  --   --   --   HEPARINUNFRC  --    < >  --   --  0.24* 0.48 0.48  CREATININE  --   --  0.81 0.82  --   --   --   TROPONINI 0.10*  --  0.11*  --   --   --   --    < > = values in this interval not displayed.    Estimated Creatinine Clearance: 70.3 mL/min (by C-G formula based on SCr of 0.82 mg/dL).   Medications:  Scheduled:  . aspirin EC  81 mg Oral QHS  . atenolol  50 mg Oral Daily  . atorvastatin  80 mg Oral q1800  . finasteride  5 mg Oral Daily  . insulin aspart  0-15 Units Subcutaneous TID WC  . insulin aspart  0-5 Units Subcutaneous QHS  . insulin glargine  12 Units Subcutaneous BID  . isosorbide mononitrate  30 mg Oral Daily  . lisinopril  2.5 mg Oral QHS  . sodium chloride flush  3 mL Intravenous Q12H  . sodium chloride flush  3 mL Intravenous Q12H   Infusions:  . sodium chloride    . sodium chloride    . heparin 950 Units/hr (03/02/18 2059)    Assessment: 62 yo male admitted with substernal chest pain. Pharmacy consulted to manage heparin drip. Patient not on anticoagulation prior to admission. Patient planning for CABG 7/8.  Goal of Therapy:  Heparin level 0.3-0.7  units/ml Monitor platelets by anticoagulation protocol: Yes   Plan:  Continue heparin 950 units/hr Continue to monitor H&H and platelets  Nicholas [name] Nicholas [name], PharmD Pharmacy Resident Phone 03/03/2018 until 1500: [phone] Nicholas [name] 03/03/2018,9:36 AM

## 2018-03-03 NOTE — Progress Notes (Addendum)
Progress Note  Patient Name: Nicholas [name] Date of Encounter: 03/03/2018  Primary Cardiologist: Dr. Wyline Mood  Subjective   Had some chest tightness radiating to both shoulders when walking in hallway earlier. Also had some while lying in bed rated at 4/10, now 1/10 after one SL nitro. Says he feels much better now. SBP 97 mmHg after nitro. Denies dizziness. No arrhythmias on telemetry.  Inpatient Medications    Scheduled Meds: . aspirin EC  81 mg Oral QHS  . atenolol  50 mg Oral Daily  . atorvastatin  80 mg Oral q1800  . finasteride  5 mg Oral Daily  . insulin aspart  0-15 Units Subcutaneous TID WC  . insulin aspart  0-5 Units Subcutaneous QHS  . insulin glargine  12 Units Subcutaneous BID  . isosorbide mononitrate  30 mg Oral Daily  . lisinopril  2.5 mg Oral QHS  . sodium chloride flush  3 mL Intravenous Q12H  . sodium chloride flush  3 mL Intravenous Q12H   Continuous Infusions: . sodium chloride    . sodium chloride    . heparin 950 Units/hr (03/02/18 2059)   PRN Meds: sodium chloride, sodium chloride, acetaminophen, docusate sodium, nitroGLYCERIN, ondansetron (ZOFRAN) IV, sodium chloride flush, sodium chloride flush   Vital Signs    Vitals:   03/03/18 0731 03/03/18 0735 03/03/18 0834 03/03/18 0839  BP: 136/64 136/64 134/85 97/72  Pulse:  65 62 (!) 59  Resp:  20    Temp:      TempSrc:      SpO2:  99% 99% 99%  Weight:      Height:        Intake/Output Summary (Last 24 hours) at 03/03/2018 0843 Last data filed at 03/03/2018 0824 Gross per 24 hour  Intake 724.76 ml  Output 2700 ml  Net -1975.24 ml   Filed Weights   03/01/18 0506 03/02/18 0447 03/03/18 0438  Weight: 139 lb 3.2 oz (63.1 kg) 140 lb 12.8 oz (63.9 kg) 138 lb 6.4 oz (62.8 kg)    Telemetry    Sinus rhythm without ectopy- Personally Reviewed  ECG    Sinus rhythm, TWI's in III and aVF, peaked T waves precordially - Personally Reviewed  Physical Exam   GEN: No acute distress.   Neck: No  JVD Cardiac: RRR, no murmurs, rubs, or gallops.  Respiratory: Clear to auscultation bilaterally. GI: Soft, nontender, non-distended  MS: No edema; No deformity. Neuro:  Nonfocal  Psych: Normal affect   Labs    Chemistry Recent Labs  Lab 02/27/18 2257 02/28/18 0346 03/01/18 1016 03/02/18 0255  NA 136  --  140 140  K 4.0  --  4.1 4.0  CL 106  --  109 108  CO2 23  --  24 24  GLUCOSE 213*  --  133* 134*  BUN 14  --  8 7*  CREATININE 0.80 0.83 0.81 0.82  CALCIUM 8.8*  --  8.4* 8.6*  PROT  --   --   --  5.6*  ALBUMIN  --   --   --  3.0*  AST  --   --   --  7*  ALT  --   --   --  20  ALKPHOS  --   --   --  59  BILITOT  --   --   --  0.5  GFRNONAA >60 >60 >60 >60  GFRAA >60 >60 >60 >60  ANIONGAP 7  --  7 8  Hematology Recent Labs  Lab 02/27/18 2257 03/02/18 0255 03/03/18 0756  WBC 8.0 7.0 7.4  RBC 4.73 4.22 4.43  HGB 13.3 11.9* 12.7*  HCT 41.3 36.2* 38.0*  MCV 87.3 85.8 85.8  MCH 28.1 28.2 28.7  MCHC 32.2 32.9 33.4  RDW 13.3 13.3 13.3  PLT 240 201 216    Cardiac Enzymes Recent Labs  Lab 02/28/18 0346 02/28/18 0935 02/28/18 1541 03/01/18 1016  TROPONINI 0.04* 0.07* 0.10* 0.11*    Recent Labs  Lab 02/27/18 2308 02/28/18 0156  TROPIPOC 0.01 0.01     BNPNo results for input(s): BNP, PROBNP in the last 168 hours.   DDimer No results for input(s): DDIMER in the last 168 hours.   Radiology    No results found.  Cardiac Studies   Cardiac catheterization 03/01/2018:   Prox LAD lesion is 70% stenosed.  1st Diag lesion is 70% stenosed.  Ramus lesion is 70% stenosed.  Ost 1st Mrg lesion is 95% stenosed.  1st Mrg lesion is 60% stenosed with 60% stenosed side branch in Lat 1st Mrg.  Mid RCA lesion is 100% stenosed.  LV end diastolic pressure is normal.  1. Severe 3 vessel obstructive CAD - 70% proximal LAD with abnormal FFR of 0.73. - 95% proximal large OM1 - 100% mid RCA with left to right collaterals. 2. Normal LVEDP 3. Left  subclavian stenosis 75% with pressure gradient of 60 mm Hg. 4. Severe ostial left vertebral stenosis.   Plan: Recommend CABG for coronary revascularization. The subclavian stenosis is an issue and may compromise flow in the mammary artery long term. I discussed with Dr. Allyson Sabal and he feels the subclavian stenosis can be stented but would require DAPT. Given patient's unstable coronary presentation it may be best to proceed with CABG. There appears to be enough flow to support using the LIMA as a pedicle graft. 3-4 weeks post op the patient could go on DAPT and stent the left subclavian. Will need to get surgical opinion and co-ordinate with them. Dr. Allyson Sabal would be available for the subclavian stent procedure.   Echocardiogram 02/28/2018:  Study Conclusions  - Left ventricle: The cavity size was normal. Wall thickness was normal. Systolic function was normal. The estimated ejection fraction was in the range of 60% to 65%. Probable hypokinesis of the basalinferior myocardium. Indeterminate diastolic function. - Mitral valve: There was mild to moderate regurgitation directed posteriorly - not well seen. - Right atrium: Central venous pressure (est): 3 mm Hg. - Atrial septum: No defect or patent foramen ovale was identified. - Tricuspid valve: There was trivial regurgitation. - Pulmonary arteries: Systolic pressure could not be accurately estimated. - Pericardium, extracardiac: There was no pericardial effusion.    Patient Profile     62 y.o. male with type 2 diabetes, hypertension, hyperlipidemia, known vascular obstructive disease (subclavian stenosis) who presented with non-ST elevation myocardial infarction/unstable angina on 02/28/2018.  Assessment & Plan    1.  Non-ST elevation MI with three-vessel coronary disease and left subclavian artery stenosis: Has had some angina relieved with one SL nitro today.  Currently on aspirin, heparin, nitrates, beta-blocker, and  lisinopril.  Plan is for CABG tomorrow.  Hemodynamically stable.  2.  Hypertension: Blood pressures controlled, low normal after nitro.  No change to therapy.  He is on lisinopril and atenolol.  3.  Hyperlipidemia: Currently on Lipitor 80 mg.  LDL 112 on 02/28/2018.  4.  Left subclavian artery stenosis: Plan for endovascular stenting in the future.  For questions or updates, please contact [facility] Please consult [email or web address] for contact info under Cardiology/STEMI.      Signed, Prentice DockerSuresh Koneswaran, MD  03/03/2018, 8:43 AM

## 2018-03-03 NOTE — Progress Notes (Signed)
Pt was walking in hallway with daughter and while walking by a room I was getting report, she said he was having heavy feeling in chest and shoulders, vss, pt sat down  And quickly relieved, pt had also just eaten cereal, no distress noted, paged provider on call to advise, discussed angina sx and to rest at this time and will discuss with md,pt and daughter are also asking for just family to come visit, advised we can put a sign on door and have visitors check in at desk, also discussed triple x and no info but did not want that today

## 2018-03-04 ENCOUNTER — Inpatient Hospital Stay (HOSPITAL_COMMUNITY)
Admission: EM | Disposition: A | Discharge status: Home Health | Payer: Self-pay | Source: Home / Self Care | Attending: Cardiothoracic Surgery

## 2018-03-04 ENCOUNTER — Inpatient Hospital Stay (HOSPITAL_COMMUNITY): Payer: BLUE CROSS/BLUE SHIELD | Admitting: Certified Registered"

## 2018-03-04 ENCOUNTER — Inpatient Hospital Stay (HOSPITAL_COMMUNITY): Payer: BLUE CROSS/BLUE SHIELD

## 2018-03-04 DIAGNOSIS — Z951 Presence of aortocoronary bypass graft: Secondary | ICD-10-CM

## 2018-03-04 HISTORY — PX: CORONARY ARTERY BYPASS GRAFT: SHX141

## 2018-03-04 HISTORY — PX: TEE WITHOUT CARDIOVERSION: SHX5443

## 2018-03-04 HISTORY — DX: Presence of aortocoronary bypass graft: Z95.1

## 2018-03-04 LAB — GLUCOSE, CAPILLARY
GLUCOSE-CAPILLARY: 105 mg/dL — AB (ref 70–99)
GLUCOSE-CAPILLARY: 125 mg/dL — AB (ref 70–99)
GLUCOSE-CAPILLARY: 126 mg/dL — AB (ref 70–99)
GLUCOSE-CAPILLARY: 131 mg/dL — AB (ref 70–99)
GLUCOSE-CAPILLARY: 136 mg/dL — AB (ref 70–99)
Glucose-Capillary: 119 mg/dL — ABNORMAL HIGH (ref 70–99)
Glucose-Capillary: 104 mg/dL — ABNORMAL HIGH (ref 70–99)
Glucose-Capillary: 114 mg/dL — ABNORMAL HIGH (ref 70–99)
Glucose-Capillary: 112 mg/dL — ABNORMAL HIGH (ref 70–99)
Glucose-Capillary: 146 mg/dL — ABNORMAL HIGH (ref 70–99)

## 2018-03-04 LAB — PROTIME-INR
INR: 1.39
Prothrombin Time: 16.9 seconds — ABNORMAL HIGH (ref 11.4–15.2)

## 2018-03-04 LAB — HEMOGLOBIN AND HEMATOCRIT, BLOOD
HCT: 26.7 % — ABNORMAL LOW (ref 39.0–52.0)
Hemoglobin: 8.8 g/dL — ABNORMAL LOW (ref 13.0–17.0)

## 2018-03-04 LAB — POCT I-STAT, CHEM 8
BUN: 7 mg/dL — AB (ref 8–23)
BUN: 6 mg/dL — AB (ref 8–23)
BUN: 6 mg/dL — ABNORMAL LOW (ref 8–23)
BUN: 5 mg/dL — ABNORMAL LOW (ref 8–23)
BUN: 5 mg/dL — ABNORMAL LOW (ref 8–23)
BUN: 5 mg/dL — ABNORMAL LOW (ref 8–23)
BUN: 5 mg/dL — AB (ref 8–23)
BUN: 7 mg/dL — ABNORMAL LOW (ref 8–23)
CALCIUM ION: 1.24 mmol/L (ref 1.15–1.40)
CALCIUM ION: 1 mmol/L — AB (ref 1.15–1.40)
CALCIUM ION: 1.02 mmol/L — AB (ref 1.15–1.40)
CALCIUM ION: 1.15 mmol/L (ref 1.15–1.40)
CHLORIDE: 106 mmol/L (ref 98–111)
CHLORIDE: 108 mmol/L (ref 98–111)
CHLORIDE: 104 mmol/L (ref 98–111)
CREATININE: 0.6 mg/dL — AB (ref 0.61–1.24)
CREATININE: 0.6 mg/dL — AB (ref 0.61–1.24)
CREATININE: 0.5 mg/dL — AB (ref 0.61–1.24)
Calcium, Ion: 1.22 mmol/L (ref 1.15–1.40)
Calcium, Ion: 1.21 mmol/L (ref 1.15–1.40)
Calcium, Ion: 1.04 mmol/L — ABNORMAL LOW (ref 1.15–1.40)
Calcium, Ion: 0.97 mmol/L — ABNORMAL LOW (ref 1.15–1.40)
Chloride: 108 mmol/L (ref 98–111)
Chloride: 104 mmol/L (ref 98–111)
Chloride: 105 mmol/L (ref 98–111)
Chloride: 105 mmol/L (ref 98–111)
Chloride: 108 mmol/L (ref 98–111)
Creatinine, Ser: 0.6 mg/dL — ABNORMAL LOW (ref 0.61–1.24)
Creatinine, Ser: 0.5 mg/dL — ABNORMAL LOW (ref 0.61–1.24)
Creatinine, Ser: 0.5 mg/dL — ABNORMAL LOW (ref 0.61–1.24)
Creatinine, Ser: 0.5 mg/dL — ABNORMAL LOW (ref 0.61–1.24)
Creatinine, Ser: 0.7 mg/dL (ref 0.61–1.24)
GLUCOSE: 91 mg/dL (ref 70–99)
GLUCOSE: 85 mg/dL (ref 70–99)
GLUCOSE: 142 mg/dL — AB (ref 70–99)
Glucose, Bld: 130 mg/dL — ABNORMAL HIGH (ref 70–99)
Glucose, Bld: 103 mg/dL — ABNORMAL HIGH (ref 70–99)
Glucose, Bld: 143 mg/dL — ABNORMAL HIGH (ref 70–99)
Glucose, Bld: 117 mg/dL — ABNORMAL HIGH (ref 70–99)
Glucose, Bld: 104 mg/dL — ABNORMAL HIGH (ref 70–99)
HCT: 32 % — ABNORMAL LOW (ref 39.0–52.0)
HCT: 29 % — ABNORMAL LOW (ref 39.0–52.0)
HCT: 25 % — ABNORMAL LOW (ref 39.0–52.0)
HCT: 26 % — ABNORMAL LOW (ref 39.0–52.0)
HCT: 28 % — ABNORMAL LOW (ref 39.0–52.0)
HCT: 29 % — ABNORMAL LOW (ref 39.0–52.0)
HEMATOCRIT: 30 % — AB (ref 39.0–52.0)
HEMATOCRIT: 22 % — AB (ref 39.0–52.0)
HEMOGLOBIN: 8.5 g/dL — AB (ref 13.0–17.0)
HEMOGLOBIN: 9.5 g/dL — AB (ref 13.0–17.0)
HEMOGLOBIN: 7.5 g/dL — AB (ref 13.0–17.0)
HEMOGLOBIN: 9.9 g/dL — AB (ref 13.0–17.0)
Hemoglobin: 10.9 g/dL — ABNORMAL LOW (ref 13.0–17.0)
Hemoglobin: 10.2 g/dL — ABNORMAL LOW (ref 13.0–17.0)
Hemoglobin: 9.9 g/dL — ABNORMAL LOW (ref 13.0–17.0)
Hemoglobin: 8.8 g/dL — ABNORMAL LOW (ref 13.0–17.0)
POTASSIUM: 3.8 mmol/L (ref 3.5–5.1)
POTASSIUM: 3.8 mmol/L (ref 3.5–5.1)
POTASSIUM: 3.6 mmol/L (ref 3.5–5.1)
POTASSIUM: 4.3 mmol/L (ref 3.5–5.1)
Potassium: 3.9 mmol/L (ref 3.5–5.1)
Potassium: 4.4 mmol/L (ref 3.5–5.1)
Potassium: 3.7 mmol/L (ref 3.5–5.1)
Potassium: 4.1 mmol/L (ref 3.5–5.1)
SODIUM: 139 mmol/L (ref 135–145)
SODIUM: 140 mmol/L (ref 135–145)
SODIUM: 140 mmol/L (ref 135–145)
SODIUM: 140 mmol/L (ref 135–145)
SODIUM: 141 mmol/L (ref 135–145)
Sodium: 141 mmol/L (ref 135–145)
Sodium: 142 mmol/L (ref 135–145)
Sodium: 140 mmol/L (ref 135–145)
TCO2: 25 mmol/L (ref 22–32)
TCO2: 23 mmol/L (ref 22–32)
TCO2: 24 mmol/L (ref 22–32)
TCO2: 25 mmol/L (ref 22–32)
TCO2: 25 mmol/L (ref 22–32)
TCO2: 25 mmol/L (ref 22–32)
TCO2: 22 mmol/L (ref 22–32)
TCO2: 20 mmol/L — ABNORMAL LOW (ref 22–32)

## 2018-03-04 LAB — CBC
HCT: 29 % — ABNORMAL LOW (ref 39.0–52.0)
HEMATOCRIT: 31.9 % — AB (ref 39.0–52.0)
Hemoglobin: 10.6 g/dL — ABNORMAL LOW (ref 13.0–17.0)
Hemoglobin: 9.6 g/dL — ABNORMAL LOW (ref 13.0–17.0)
MCH: 28.6 pg (ref 26.0–34.0)
MCH: 28.2 pg (ref 26.0–34.0)
MCHC: 33.2 g/dL (ref 30.0–36.0)
MCHC: 33.1 g/dL (ref 30.0–36.0)
MCV: 86 fL (ref 78.0–100.0)
MCV: 85 fL (ref 78.0–100.0)
PLATELETS: 140 10*3/uL — AB (ref 150–400)
Platelets: 134 10*3/uL — ABNORMAL LOW (ref 150–400)
RBC: 3.71 MIL/uL — AB (ref 4.22–5.81)
RBC: 3.41 MIL/uL — ABNORMAL LOW (ref 4.22–5.81)
RDW: 13.1 % (ref 11.5–15.5)
RDW: 13.2 % (ref 11.5–15.5)
WBC: 8.2 10*3/uL (ref 4.0–10.5)
WBC: 7.9 10*3/uL (ref 4.0–10.5)

## 2018-03-04 LAB — POCT I-STAT 3, ART BLOOD GAS (G3+)
ACID-BASE DEFICIT: 3 mmol/L — AB (ref 0.0–2.0)
Acid-base deficit: 2 mmol/L (ref 0.0–2.0)
Acid-base deficit: 4 mmol/L — ABNORMAL HIGH (ref 0.0–2.0)
BICARBONATE: 23.9 mmol/L (ref 20.0–28.0)
Bicarbonate: 22.2 mmol/L (ref 20.0–28.0)
Bicarbonate: 24.5 mmol/L (ref 20.0–28.0)
Bicarbonate: 20.5 mmol/L (ref 20.0–28.0)
O2 Saturation: 100 %
O2 Saturation: 100 %
O2 Saturation: 99 %
O2 Saturation: 99 %
PCO2 ART: 32.8 mmHg (ref 32.0–48.0)
PCO2 ART: 37.6 mmHg (ref 32.0–48.0)
PH ART: 7.438 (ref 7.350–7.450)
PH ART: 7.232 — AB (ref 7.350–7.450)
PH ART: 7.354 (ref 7.350–7.450)
PO2 ART: 177 mmHg — AB (ref 83.0–108.0)
PO2 ART: 175 mmHg — AB (ref 83.0–108.0)
Patient temperature: 38.9
TCO2: 25 mmol/L (ref 22–32)
TCO2: 23 mmol/L (ref 22–32)
TCO2: 26 mmol/L (ref 22–32)
TCO2: 22 mmol/L (ref 22–32)
pCO2 arterial: 36.3 mmHg (ref 32.0–48.0)
pCO2 arterial: 58.2 mmHg — ABNORMAL HIGH (ref 32.0–48.0)
pH, Arterial: 7.427 (ref 7.350–7.450)
pO2, Arterial: 335 mmHg — ABNORMAL HIGH (ref 83.0–108.0)
pO2, Arterial: 177 mmHg — ABNORMAL HIGH (ref 83.0–108.0)

## 2018-03-04 LAB — CREATININE, SERUM
Creatinine, Ser: 0.86 mg/dL (ref 0.61–1.24)
GFR calc Af Amer: >60 mL/min (ref >60)
GFR calc non Af Amer: >60 mL/min (ref >60)

## 2018-03-04 LAB — POCT I-STAT 4, (NA,K, GLUC, HGB,HCT)
Glucose, Bld: 126 mg/dL — ABNORMAL HIGH (ref 70–99)
HEMATOCRIT: 28 % — AB (ref 39.0–52.0)
HEMOGLOBIN: 9.5 g/dL — AB (ref 13.0–17.0)
POTASSIUM: 3.6 mmol/L (ref 3.5–5.1)
SODIUM: 142 mmol/L (ref 135–145)

## 2018-03-04 LAB — APTT: APTT: 34 s (ref 24–36)

## 2018-03-04 LAB — PLATELET COUNT: Platelets: 142 10*3/uL — ABNORMAL LOW (ref 150–400)

## 2018-03-04 LAB — MAGNESIUM: Magnesium: 3.3 mg/dL — ABNORMAL HIGH (ref 1.7–2.4)

## 2018-03-04 SURGERY — CORONARY ARTERY BYPASS GRAFTING (CABG)
Anesthesia: General | Site: Chest

## 2018-03-04 MED ORDER — SODIUM CHLORIDE 0.9 % IV SOLN
INTRAVENOUS | Status: DC
  Administered 2018-03-04: 15:00:00 via INTRAVENOUS

## 2018-03-04 MED ORDER — METOCLOPRAMIDE HCL 5 MG/ML IJ SOLN
10.0000 mg | Freq: Four times a day (QID) | INTRAMUSCULAR | Status: DC
  Administered 2018-03-04 – 2018-03-08 (×16): 10 mg via INTRAVENOUS
  Filled 2018-03-04 (×17): qty 2

## 2018-03-04 MED ORDER — ALBUMIN HUMAN 5 % IV SOLN
250.0000 mL | INTRAVENOUS | Status: AC | PRN
  Administered 2018-03-04: 250 mL via INTRAVENOUS
  Filled 2018-03-04: qty 250

## 2018-03-04 MED ORDER — SUFENTANIL CITRATE 250 MCG/5ML IV SOLN
INTRAVENOUS | Status: AC
  Filled 2018-03-04: qty 5

## 2018-03-04 MED ORDER — MIDAZOLAM HCL 5 MG/5ML IJ SOLN
INTRAMUSCULAR | Status: DC | PRN
  Administered 2018-03-04 (×2): 2 mg via INTRAVENOUS
  Administered 2018-03-04: 3 mg via INTRAVENOUS
  Administered 2018-03-04: 2 mg via INTRAVENOUS
  Administered 2018-03-04: 1 mg via INTRAVENOUS

## 2018-03-04 MED ORDER — ALBUMIN HUMAN 5 % IV SOLN
250.0000 mL | INTRAVENOUS | Status: AC | PRN
  Administered 2018-03-04 (×4): 250 mL via INTRAVENOUS
  Filled 2018-03-04 (×2): qty 250

## 2018-03-04 MED ORDER — METOPROLOL TARTRATE 5 MG/5ML IV SOLN
2.5000 mg | INTRAVENOUS | Status: DC | PRN
  Administered 2018-03-05: 5 mg via INTRAVENOUS
  Filled 2018-03-04 (×2): qty 5

## 2018-03-04 MED ORDER — MAGNESIUM SULFATE 4 GM/100ML IV SOLN
4.0000 g | Freq: Once | INTRAVENOUS | Status: AC
  Administered 2018-03-04: 4 g via INTRAVENOUS
  Filled 2018-03-04 (×2): qty 100

## 2018-03-04 MED ORDER — HEMOSTATIC AGENTS (NO CHARGE) OPTIME
TOPICAL | Status: DC | PRN
  Administered 2018-03-04 (×4): 1 via TOPICAL

## 2018-03-04 MED ORDER — BISACODYL 10 MG RE SUPP
10.0000 mg | Freq: Every day | RECTAL | Status: DC
  Filled 2018-03-04: qty 1

## 2018-03-04 MED ORDER — SODIUM CHLORIDE 0.9 % IV SOLN: 250.0000 mL | INTRAVENOUS | Status: DC

## 2018-03-04 MED ORDER — BISACODYL 5 MG PO TBEC
10.0000 mg | DELAYED_RELEASE_TABLET | Freq: Every day | ORAL | Status: DC
  Administered 2018-03-05 – 2018-03-08 (×4): 10 mg via ORAL
  Filled 2018-03-04 (×6): qty 2

## 2018-03-04 MED ORDER — PANTOPRAZOLE SODIUM 40 MG PO TBEC
40.0000 mg | DELAYED_RELEASE_TABLET | Freq: Every day | ORAL | Status: DC
  Administered 2018-03-06 – 2018-03-09 (×4): 40 mg via ORAL
  Filled 2018-03-04 (×5): qty 1

## 2018-03-04 MED ORDER — LACTATED RINGERS IV SOLN
INTRAVENOUS | Status: DC | PRN
  Administered 2018-03-04 (×2): via INTRAVENOUS

## 2018-03-04 MED ORDER — SODIUM CHLORIDE 0.45 % IV SOLN
INTRAVENOUS | Status: DC | PRN
  Administered 2018-03-04: 15:00:00 via INTRAVENOUS

## 2018-03-04 MED ORDER — LACTATED RINGERS IV SOLN: INTRAVENOUS | Status: DC

## 2018-03-04 MED ORDER — ONDANSETRON HCL 4 MG/2ML IJ SOLN
4.0000 mg | Freq: Four times a day (QID) | INTRAMUSCULAR | Status: DC | PRN
  Administered 2018-03-05 – 2018-03-07 (×4): 4 mg via INTRAVENOUS
  Filled 2018-03-04 (×5): qty 2

## 2018-03-04 MED ORDER — HEPARIN SODIUM (PORCINE) 1000 UNIT/ML IJ SOLN
INTRAMUSCULAR | Status: AC
  Filled 2018-03-04: qty 2

## 2018-03-04 MED ORDER — POTASSIUM CHLORIDE 10 MEQ/50ML IV SOLN
10.0000 meq | Freq: Once | INTRAVENOUS | Status: AC
  Administered 2018-03-04: 10 meq via INTRAVENOUS
  Filled 2018-03-04: qty 50

## 2018-03-04 MED ORDER — VANCOMYCIN HCL IN DEXTROSE 1-5 GM/200ML-% IV SOLN
1000.0000 mg | Freq: Once | INTRAVENOUS | Status: AC
  Administered 2018-03-04: 1000 mg via INTRAVENOUS
  Filled 2018-03-04 (×2): qty 200

## 2018-03-04 MED ORDER — LACTATED RINGERS IV SOLN
INTRAVENOUS | Status: DC
  Administered 2018-03-04: 15:00:00 via INTRAVENOUS

## 2018-03-04 MED ORDER — PHENYLEPHRINE 40 MCG/ML (10ML) SYRINGE FOR IV PUSH (FOR BLOOD PRESSURE SUPPORT)
PREFILLED_SYRINGE | INTRAVENOUS | Status: AC
  Filled 2018-03-04: qty 20

## 2018-03-04 MED ORDER — SODIUM CHLORIDE 0.9 % IV SOLN
1.5000 g | Freq: Two times a day (BID) | INTRAVENOUS | Status: AC
  Administered 2018-03-04 – 2018-03-06 (×4): 1.5 g via INTRAVENOUS
  Filled 2018-03-04 (×4): qty 1.5

## 2018-03-04 MED ORDER — SUFENTANIL CITRATE 50 MCG/ML IV SOLN
INTRAVENOUS | Status: DC | PRN
  Administered 2018-03-04 (×2): 30 ug via INTRAVENOUS
  Administered 2018-03-04: 10 ug via INTRAVENOUS
  Administered 2018-03-04 (×2): 20 ug via INTRAVENOUS
  Administered 2018-03-04: 40 ug via INTRAVENOUS

## 2018-03-04 MED ORDER — FAMOTIDINE IN NACL 20-0.9 MG/50ML-% IV SOLN
20.0000 mg | Freq: Two times a day (BID) | INTRAVENOUS | Status: AC
  Administered 2018-03-04 (×2): 20 mg via INTRAVENOUS
  Filled 2018-03-04 (×2): qty 50

## 2018-03-04 MED ORDER — PROTAMINE SULFATE 10 MG/ML IV SOLN
INTRAVENOUS | Status: DC | PRN
  Administered 2018-03-04: 90 mg via INTRAVENOUS
  Administered 2018-03-04: 10 mg via INTRAVENOUS
  Administered 2018-03-04: 90 mg via INTRAVENOUS

## 2018-03-04 MED ORDER — MILRINONE LACTATE IN DEXTROSE 20-5 MG/100ML-% IV SOLN
0.2500 ug/kg/min | INTRAVENOUS | Status: DC
  Administered 2018-03-04 – 2018-03-05 (×3): 0.25 ug/kg/min via INTRAVENOUS
  Filled 2018-03-04 (×3): qty 100

## 2018-03-04 MED ORDER — LACTATED RINGERS IV SOLN: 500.0000 mL | Freq: Once | INTRAVENOUS | Status: DC | PRN

## 2018-03-04 MED ORDER — LACTATED RINGERS IV SOLN
INTRAVENOUS | Status: DC | PRN
  Administered 2018-03-04: 07:00:00 via INTRAVENOUS

## 2018-03-04 MED ORDER — SODIUM CHLORIDE 0.9 % IV SOLN
INTRAVENOUS | Status: DC
  Administered 2018-03-04: 250 mL via INTRAVENOUS

## 2018-03-04 MED ORDER — MOMETASONE FURO-FORMOTEROL FUM 200-5 MCG/ACT IN AERO
2.0000 | INHALATION_SPRAY | Freq: Two times a day (BID) | RESPIRATORY_TRACT | Status: DC
  Administered 2018-03-05 – 2018-03-06 (×3): 2 via RESPIRATORY_TRACT
  Filled 2018-03-04: qty 8.8

## 2018-03-04 MED ORDER — CHLORHEXIDINE GLUCONATE 0.12% ORAL RINSE (MEDLINE KIT)
15.0000 mL | Freq: Two times a day (BID) | OROMUCOSAL | Status: DC
  Administered 2018-03-04 (×2): 15 mL via OROMUCOSAL

## 2018-03-04 MED ORDER — PROPOFOL 10 MG/ML IV BOLUS
INTRAVENOUS | Status: AC
  Filled 2018-03-04: qty 20

## 2018-03-04 MED ORDER — 0.9 % SODIUM CHLORIDE (POUR BTL) OPTIME
TOPICAL | Status: DC | PRN
  Administered 2018-03-04: 5000 mL

## 2018-03-04 MED ORDER — ASPIRIN EC 325 MG PO TBEC
325.0000 mg | DELAYED_RELEASE_TABLET | Freq: Every day | ORAL | Status: DC
  Administered 2018-03-05 – 2018-03-09 (×5): 325 mg via ORAL
  Filled 2018-03-04 (×6): qty 1

## 2018-03-04 MED ORDER — PHENYLEPHRINE HCL-NACL 20-0.9 MG/250ML-% IV SOLN
0.0000 ug/min | INTRAVENOUS | Status: DC
  Filled 2018-03-04: qty 250

## 2018-03-04 MED ORDER — CHLORHEXIDINE GLUCONATE 0.12 % MT SOLN
15.0000 mL | OROMUCOSAL | Status: AC
  Administered 2018-03-04: 15 mL via OROMUCOSAL
  Filled 2018-03-04: qty 15

## 2018-03-04 MED ORDER — SODIUM CHLORIDE 0.9% FLUSH
10.0000 mL | INTRAVENOUS | Status: DC | PRN
  Administered 2018-03-05: 20 mL
  Filled 2018-03-04: qty 40

## 2018-03-04 MED ORDER — VECURONIUM BROMIDE 10 MG IV SOLR
INTRAVENOUS | Status: AC
  Filled 2018-03-04: qty 10

## 2018-03-04 MED ORDER — HEPARIN SODIUM (PORCINE) 1000 UNIT/ML IJ SOLN
INTRAMUSCULAR | Status: DC | PRN
  Administered 2018-03-04: 17000 [IU] via INTRAVENOUS
  Administered 2018-03-04: 2000 [IU] via INTRAVENOUS

## 2018-03-04 MED ORDER — LEVALBUTEROL HCL 0.63 MG/3ML IN NEBU
0.6300 mg | INHALATION_SOLUTION | Freq: Three times a day (TID) | RESPIRATORY_TRACT | Status: DC
  Administered 2018-03-04 – 2018-03-06 (×8): 0.63 mg via RESPIRATORY_TRACT
  Filled 2018-03-04 (×10): qty 3

## 2018-03-04 MED ORDER — DEXMEDETOMIDINE HCL IN NACL 200 MCG/50ML IV SOLN
0.0000 ug/kg/h | INTRAVENOUS | Status: DC
  Administered 2018-03-04: 0.4 ug/kg/h via INTRAVENOUS
  Filled 2018-03-04: qty 50

## 2018-03-04 MED ORDER — POTASSIUM CHLORIDE 10 MEQ/50ML IV SOLN
10.0000 meq | INTRAVENOUS | Status: AC
  Administered 2018-03-04 (×3): 10 meq via INTRAVENOUS
  Filled 2018-03-04: qty 50

## 2018-03-04 MED ORDER — MIDAZOLAM HCL 2 MG/2ML IJ SOLN: 2.0000 mg | INTRAMUSCULAR | Status: DC | PRN

## 2018-03-04 MED ORDER — NITROGLYCERIN IN D5W 200-5 MCG/ML-% IV SOLN
0.0000 ug/min | INTRAVENOUS | Status: DC
  Filled 2018-03-04: qty 250

## 2018-03-04 MED ORDER — DEXTROSE 5 % IV SOLN
INTRAVENOUS | Status: DC | PRN
  Administered 2018-03-04: 20 ug/min via INTRAVENOUS

## 2018-03-04 MED ORDER — ACETAMINOPHEN 160 MG/5ML PO SOLN
1000.0000 mg | Freq: Four times a day (QID) | ORAL | Status: DC
  Administered 2018-03-04: 1000 mg
  Filled 2018-03-04 (×2): qty 40.6

## 2018-03-04 MED ORDER — ORAL CARE MOUTH RINSE
15.0000 mL | OROMUCOSAL | Status: DC
  Administered 2018-03-04 (×3): 15 mL via OROMUCOSAL

## 2018-03-04 MED ORDER — PAPAVERINE HCL 30 MG/ML IJ SOLN
INTRAMUSCULAR | Status: DC | PRN
  Administered 2018-03-04 (×2): 500 mL via INTRAVASCULAR

## 2018-03-04 MED ORDER — CALCIUM CHLORIDE 10 % IV SOLN
1.0000 g | Freq: Once | INTRAVENOUS | Status: AC
  Administered 2018-03-04: 1 g via INTRAVENOUS

## 2018-03-04 MED ORDER — ACETAMINOPHEN 10 MG/ML IV SOLN
1000.0000 mg | Freq: Once | INTRAVENOUS | Status: AC
  Administered 2018-03-04: 1000 mg via INTRAVENOUS
  Filled 2018-03-04: qty 100

## 2018-03-04 MED ORDER — PLASMA-LYTE 148 IV SOLN
INTRAVENOUS | Status: DC
  Filled 2018-03-04: qty 2.5

## 2018-03-04 MED ORDER — VECURONIUM BROMIDE 10 MG IV SOLR
INTRAVENOUS | Status: DC | PRN
  Administered 2018-03-04 (×3): 2 mg via INTRAVENOUS
  Administered 2018-03-04: 8 mg via INTRAVENOUS
  Administered 2018-03-04: 2 mg via INTRAVENOUS

## 2018-03-04 MED ORDER — INSULIN REGULAR BOLUS VIA INFUSION
0.0000 [IU] | Freq: Three times a day (TID) | INTRAVENOUS | Status: DC
  Filled 2018-03-04: qty 10

## 2018-03-04 MED ORDER — DOPAMINE-DEXTROSE 3.2-5 MG/ML-% IV SOLN
3.0000 ug/kg/min | INTRAVENOUS | Status: DC
  Filled 2018-03-04: qty 250

## 2018-03-04 MED ORDER — MORPHINE SULFATE (PF) 2 MG/ML IV SOLN: 1.0000 mg | INTRAVENOUS | Status: DC | PRN

## 2018-03-04 MED ORDER — TRAMADOL HCL 50 MG PO TABS
50.0000 mg | ORAL_TABLET | ORAL | Status: DC | PRN
  Administered 2018-03-05: 50 mg via ORAL
  Filled 2018-03-04: qty 1

## 2018-03-04 MED ORDER — KETOROLAC TROMETHAMINE 15 MG/ML IJ SOLN
15.0000 mg | Freq: Once | INTRAMUSCULAR | Status: AC
Start: 2018-03-04 — End: 2018-03-04
  Administered 2018-03-04: 15 mg via INTRAVENOUS
  Filled 2018-03-04: qty 1

## 2018-03-04 MED ORDER — ACETAMINOPHEN 500 MG PO TABS
1000.0000 mg | ORAL_TABLET | Freq: Four times a day (QID) | ORAL | Status: DC
  Administered 2018-03-05 – 2018-03-06 (×5): 1000 mg via ORAL
  Filled 2018-03-04 (×6): qty 2

## 2018-03-04 MED ORDER — ACETAMINOPHEN 650 MG RE SUPP
650.0000 mg | Freq: Once | RECTAL | Status: AC
  Administered 2018-03-04: 650 mg via RECTAL
  Filled 2018-03-04: qty 1

## 2018-03-04 MED ORDER — DOCUSATE SODIUM 100 MG PO CAPS
200.0000 mg | ORAL_CAPSULE | Freq: Every day | ORAL | Status: DC
  Administered 2018-03-05 – 2018-03-09 (×5): 200 mg via ORAL
  Filled 2018-03-04 (×6): qty 2

## 2018-03-04 MED ORDER — MORPHINE SULFATE (PF) 2 MG/ML IV SOLN: 2.0000 mg | INTRAVENOUS | Status: DC | PRN

## 2018-03-04 MED ORDER — METOPROLOL TARTRATE 25 MG/10 ML ORAL SUSPENSION
12.5000 mg | Freq: Two times a day (BID) | ORAL | Status: DC
  Filled 2018-03-04 (×2): qty 5

## 2018-03-04 MED ORDER — MIDAZOLAM HCL 10 MG/2ML IJ SOLN
INTRAMUSCULAR | Status: AC
  Filled 2018-03-04: qty 2

## 2018-03-04 MED ORDER — ACETAMINOPHEN 160 MG/5ML PO SOLN
650.0000 mg | Freq: Once | ORAL | Status: AC
  Filled 2018-03-04: qty 20.3

## 2018-03-04 MED ORDER — CHLORHEXIDINE GLUCONATE CLOTH 2 % EX PADS
6.0000 | MEDICATED_PAD | Freq: Every day | CUTANEOUS | Status: DC
  Administered 2018-03-04 – 2018-03-07 (×3): 6 via TOPICAL

## 2018-03-04 MED ORDER — SODIUM CHLORIDE 0.9 % IV SOLN
INTRAVENOUS | Status: DC
  Administered 2018-03-04: 0.7 [IU]/h via INTRAVENOUS
  Filled 2018-03-04: qty 1

## 2018-03-04 MED ORDER — PROTAMINE SULFATE 10 MG/ML IV SOLN
INTRAVENOUS | Status: AC
  Filled 2018-03-04: qty 25

## 2018-03-04 MED ORDER — OXYCODONE HCL 5 MG PO TABS
5.0000 mg | ORAL_TABLET | ORAL | Status: DC | PRN
  Administered 2018-03-05: 10 mg via ORAL
  Administered 2018-03-05: 5 mg via ORAL
  Filled 2018-03-04: qty 2
  Filled 2018-03-04: qty 1
  Filled 2018-03-04: qty 2

## 2018-03-04 MED ORDER — SODIUM CHLORIDE 0.9% FLUSH: 3.0000 mL | INTRAVENOUS | Status: DC | PRN

## 2018-03-04 MED ORDER — PROPOFOL 10 MG/ML IV BOLUS
INTRAVENOUS | Status: DC | PRN
  Administered 2018-03-04: 50 mg via INTRAVENOUS
  Administered 2018-03-04: 30 mg via INTRAVENOUS

## 2018-03-04 MED ORDER — SODIUM CHLORIDE 0.9% FLUSH
3.0000 mL | Freq: Two times a day (BID) | INTRAVENOUS | Status: DC
  Administered 2018-03-05 (×2): 3 mL via INTRAVENOUS

## 2018-03-04 MED ORDER — SODIUM BICARBONATE 8.4 % IV SOLN
25.0000 meq | Freq: Once | INTRAVENOUS | Status: AC
  Administered 2018-03-04: 25 meq via INTRAVENOUS

## 2018-03-04 MED ORDER — ASPIRIN 81 MG PO CHEW
324.0000 mg | CHEWABLE_TABLET | Freq: Every day | ORAL | Status: DC
  Filled 2018-03-04: qty 4

## 2018-03-04 MED ORDER — METOPROLOL TARTRATE 12.5 MG HALF TABLET
12.5000 mg | ORAL_TABLET | Freq: Two times a day (BID) | ORAL | Status: DC
  Administered 2018-03-05 (×2): 12.5 mg via ORAL
  Filled 2018-03-04 (×4): qty 1

## 2018-03-04 MED FILL — Heparin Sod (Porcine)-NaCl IV Soln 1000 Unit/500ML-0.9%: INTRAVENOUS | Qty: 1000 | Status: AC

## 2018-03-04 SURGICAL SUPPLY — 109 items
ADAPTER CARDIO PERF ANTE/RETRO (ADAPTER) ×4 IMPLANT
ADH SKN CLS APL DERMABOND .7 (GAUZE/BANDAGES/DRESSINGS) ×2
ADPR PRFSN 84XANTGRD RTRGD (ADAPTER) ×2
BAG DECANTER FOR FLEXI CONT (MISCELLANEOUS) ×6 IMPLANT
BANDAGE ACE 4X5 VEL STRL LF (GAUZE/BANDAGES/DRESSINGS) ×4 IMPLANT
BANDAGE ACE 6X5 VEL STRL LF (GAUZE/BANDAGES/DRESSINGS) ×4 IMPLANT
BASKET HEART  (ORDER IN 25'S) (MISCELLANEOUS)
BASKET HEART (ORDER IN 25'S) (MISCELLANEOUS)
BASKET HEART (ORDER IN 25S) (MISCELLANEOUS) ×2 IMPLANT
BLADE CLIPPER SURG (BLADE) ×2 IMPLANT
BLADE STERNUM SYSTEM 6 (BLADE) ×4 IMPLANT
BLADE SURG 11 STRL SS (BLADE) ×2 IMPLANT
BLADE SURG 12 STRL SS (BLADE) ×4 IMPLANT
BNDG GAUZE ELAST 4 BULKY (GAUZE/BANDAGES/DRESSINGS) ×4 IMPLANT
CANISTER SUCT 3000ML PPV (MISCELLANEOUS) ×4 IMPLANT
CANNULA GUNDRY RCSP 15FR (MISCELLANEOUS) ×4 IMPLANT
CANNULA VESSEL 3MM 2 BLNT TIP (CANNULA) ×2 IMPLANT
CANNULA VESSEL 3MM BLUNT TIP (CANNULA) ×2 IMPLANT
CATH CPB KIT VANTRIGT (MISCELLANEOUS) ×4 IMPLANT
CATH ROBINSON RED A/P 18FR (CATHETERS) ×12 IMPLANT
CATH THORACIC 36FR RT ANG (CATHETERS) ×4 IMPLANT
CLIP RETRACTION 3.0MM CORONARY (MISCELLANEOUS) ×2 IMPLANT
CRADLE DONUT ADULT HEAD (MISCELLANEOUS) ×4 IMPLANT
DERMABOND ADVANCED (GAUZE/BANDAGES/DRESSINGS) ×2
DERMABOND ADVANCED .7 DNX12 (GAUZE/BANDAGES/DRESSINGS) IMPLANT
DRAIN CHANNEL 32F RND 10.7 FF (WOUND CARE) ×4 IMPLANT
DRAPE CARDIOVASCULAR INCISE (DRAPES) ×4
DRAPE SLUSH/WARMER DISC (DRAPES) ×6 IMPLANT
DRAPE SRG 135X102X78XABS (DRAPES) ×2 IMPLANT
DRSG AQUACEL AG ADV 3.5X14 (GAUZE/BANDAGES/DRESSINGS) ×4 IMPLANT
ELECT BLADE 4.0 EZ CLEAN MEGAD (MISCELLANEOUS) ×4
ELECT BLADE 6.5 EXT (BLADE) ×4 IMPLANT
ELECT CAUTERY BLADE 6.4 (BLADE) ×4 IMPLANT
ELECT REM PT RETURN 9FT ADLT (ELECTROSURGICAL) ×8
ELECTRODE BLDE 4.0 EZ CLN MEGD (MISCELLANEOUS) ×2 IMPLANT
ELECTRODE REM PT RTRN 9FT ADLT (ELECTROSURGICAL) ×4 IMPLANT
FELT TEFLON 1X6 (MISCELLANEOUS) ×6 IMPLANT
FLOSEAL 5ML (HEMOSTASIS) ×2 IMPLANT
GAUZE SPONGE 4X4 12PLY STRL (GAUZE/BANDAGES/DRESSINGS) ×6 IMPLANT
GAUZE SPONGE 4X4 12PLY STRL LF (GAUZE/BANDAGES/DRESSINGS) ×2 IMPLANT
GLOVE BIO SURGEON STRL SZ 6 (GLOVE) ×6 IMPLANT
GLOVE BIO SURGEON STRL SZ 6.5 (GLOVE) ×6 IMPLANT
GLOVE BIO SURGEON STRL SZ7.5 (GLOVE) ×12 IMPLANT
GLOVE BIO SURGEONS STRL SZ 6.5 (GLOVE) ×6
GLOVE BIOGEL PI IND STRL 6 (GLOVE) IMPLANT
GLOVE BIOGEL PI INDICATOR 6 (GLOVE) ×6
GOWN STRL REUS W/ TWL LRG LVL3 (GOWN DISPOSABLE) ×8 IMPLANT
GOWN STRL REUS W/ TWL XL LVL3 (GOWN DISPOSABLE) IMPLANT
GOWN STRL REUS W/TWL LRG LVL3 (GOWN DISPOSABLE) ×44
GOWN STRL REUS W/TWL XL LVL3 (GOWN DISPOSABLE) ×8
HEMOSTAT ARISTA ABSORB 3G PWDR (MISCELLANEOUS) ×2 IMPLANT
HEMOSTAT POWDER SURGIFOAM 1G (HEMOSTASIS) ×6 IMPLANT
HEMOSTAT SURGICEL 2X14 (HEMOSTASIS) ×4 IMPLANT
INSERT FOGARTY XLG (MISCELLANEOUS) IMPLANT
KIT BASIN OR (CUSTOM PROCEDURE TRAY) ×4 IMPLANT
KIT SUCTION CATH 14FR (SUCTIONS) ×6 IMPLANT
KIT TURNOVER KIT B (KITS) ×4 IMPLANT
KIT VASOVIEW HEMOPRO VH 3000 (KITS) ×4 IMPLANT
LEAD PACING MYOCARDI (MISCELLANEOUS) ×4 IMPLANT
MARKER GRAFT CORONARY BYPASS (MISCELLANEOUS) ×12 IMPLANT
NS IRRIG 1000ML POUR BTL (IV SOLUTION) ×20 IMPLANT
PACK E OPEN HEART (SUTURE) ×4 IMPLANT
PACK OPEN HEART (CUSTOM PROCEDURE TRAY) ×4 IMPLANT
PAD ARMBOARD 7.5X6 YLW CONV (MISCELLANEOUS) ×8 IMPLANT
PAD ELECT DEFIB RADIOL ZOLL (MISCELLANEOUS) ×4 IMPLANT
PENCIL BUTTON HOLSTER BLD 10FT (ELECTRODE) ×4 IMPLANT
POWDER SURGICEL 3.0 GRAM (HEMOSTASIS) ×4 IMPLANT
PUNCH AORTIC ROTATE  4.5MM 8IN (MISCELLANEOUS) ×2 IMPLANT
PUNCH AORTIC ROTATE 4.0MM (MISCELLANEOUS) IMPLANT
PUNCH AORTIC ROTATE 4.5MM 8IN (MISCELLANEOUS) IMPLANT
PUNCH AORTIC ROTATE 5MM 8IN (MISCELLANEOUS) IMPLANT
SET CARDIOPLEGIA MPS 5001102 (MISCELLANEOUS) ×2 IMPLANT
SOLUTION ANTI FOG 6CC (MISCELLANEOUS) ×2 IMPLANT
SPONGE LAP 18X18 X RAY DECT (DISPOSABLE) ×4 IMPLANT
SPONGE LAP 4X18 RFD (DISPOSABLE) ×2 IMPLANT
SURGIFLO W/THROMBIN 8M KIT (HEMOSTASIS) ×2 IMPLANT
SUT BONE WAX W31G (SUTURE) ×4 IMPLANT
SUT MNCRL AB 4-0 PS2 18 (SUTURE) ×2 IMPLANT
SUT PROLENE 3 0 SH DA (SUTURE) IMPLANT
SUT PROLENE 3 0 SH1 36 (SUTURE) IMPLANT
SUT PROLENE 4 0 RB 1 (SUTURE) ×12
SUT PROLENE 4 0 SH DA (SUTURE) ×4 IMPLANT
SUT PROLENE 4-0 RB1 .5 CRCL 36 (SUTURE) ×2 IMPLANT
SUT PROLENE 5 0 C 1 36 (SUTURE) IMPLANT
SUT PROLENE 6 0 C 1 30 (SUTURE) ×6 IMPLANT
SUT PROLENE 6 0 CC (SUTURE) ×30 IMPLANT
SUT PROLENE 7 0 BV1 MDA (SUTURE) IMPLANT
SUT PROLENE 8 0 BV175 6 (SUTURE) ×2 IMPLANT
SUT PROLENE BLUE 7 0 (SUTURE) ×8 IMPLANT
SUT SILK  1 MH (SUTURE)
SUT SILK 1 MH (SUTURE) IMPLANT
SUT SILK 2 0 SH CR/8 (SUTURE) ×2 IMPLANT
SUT SILK 3 0 SH CR/8 (SUTURE) IMPLANT
SUT STEEL 6MS V (SUTURE) ×6 IMPLANT
SUT STEEL SZ 6 DBL 3X14 BALL (SUTURE) ×4 IMPLANT
SUT VIC AB 1 CTX 36 (SUTURE) ×8
SUT VIC AB 1 CTX36XBRD ANBCTR (SUTURE) ×4 IMPLANT
SUT VIC AB 2-0 CT1 27 (SUTURE) ×8
SUT VIC AB 2-0 CT1 TAPERPNT 27 (SUTURE) IMPLANT
SUT VIC AB 2-0 CTX 27 (SUTURE) IMPLANT
SUT VIC AB 3-0 X1 27 (SUTURE) IMPLANT
SYSTEM SAHARA CHEST DRAIN ATS (WOUND CARE) ×4 IMPLANT
TAPE CLOTH SURG 4X10 WHT LF (GAUZE/BANDAGES/DRESSINGS) ×4 IMPLANT
TOWEL GREEN STERILE (TOWEL DISPOSABLE) ×4 IMPLANT
TOWEL GREEN STERILE FF (TOWEL DISPOSABLE) ×4 IMPLANT
TRAY FOLEY SLVR 16FR TEMP STAT (SET/KITS/TRAYS/PACK) ×4 IMPLANT
TUBING INSUFFLATION (TUBING) ×4 IMPLANT
UNDERPAD 30X30 (UNDERPADS AND DIAPERS) ×4 IMPLANT
WATER STERILE IRR 1000ML POUR (IV SOLUTION) ×8 IMPLANT

## 2018-03-04 NOTE — Progress Notes (Signed)
  Echocardiogram Echocardiogram Transesophageal has been performed.  Janalyn HarderWest, Seanpatrick Maisano R 03/04/2018, 8:39 AM

## 2018-03-04 NOTE — Progress Notes (Signed)
Attempted wean, but pt apneic so flipped back to full support. Will try again when pt more alert.  Will continue to monitor.

## 2018-03-04 NOTE — Progress Notes (Signed)
Pt had critical ABG results. Tidal volume and respiratory rate increased per Dr. Donata ClayVan Trigt.

## 2018-03-04 NOTE — Brief Op Note (Signed)
02/27/2018 - 03/04/2018  2:14 PM  PATIENT:  Nicholas [name]  62 y.o. male  PRE-OPERATIVE DIAGNOSIS:  CAD  POST-OPERATIVE DIAGNOSIS:  CAD  PROCEDURE:  Procedure(s):  CORONARY ARTERY BYPASS GRAFTING x 3 -FREE LIMA to LAD -SVG to DIAGONAL -SVG to OM  ENDOSCOPIC HARVEST GREATER SAPHENOUS VEIN -Right Thigh  TRANSESOPHAGEAL ECHOCARDIOGRAM (TEE) (N/A)  SURGEON:  Surgeon(s) and Role:    Kerin Perna* Van Trigt, Peter, MD - Primary  PHYSICIAN ASSISTANT: Lowella DandyErin Berneda Piccininni PA-C  ANESTHESIA:   general  EBL:  705 mL   BLOOD ADMINISTERED: CELLSAVER  DRAINS: Left Pleural Chest Tube, Mediastinal Chest Drain   LOCAL MEDICATIONS USED:  NONE  SPECIMEN:  No Specimen  DISPOSITION OF SPECIMEN:  N/A  COUNTS:  YES  TOURNIQUET:  * No tourniquets in log *  DICTATION: .Dragon Dictation  PLAN OF CARE: Admit to inpatient   PATIENT DISPOSITION:  ICU - intubated and hemodynamically stable.   Delay start of Pharmacological VTE agent (>24hrs) due to surgical blood loss or risk of bleeding: yes

## 2018-03-04 NOTE — Anesthesia Procedure Notes (Signed)
Central Venous Catheter Insertion Performed by: Kipp BroodJoslin, Jaicee Michelotti, MD, anesthesiologist Start/End7/03/2018 6:55 AM, 03/04/2018 7:05 AM Patient location: Pre-op. Preanesthetic checklist: patient identified, IV checked, site marked, risks and benefits discussed, surgical consent, monitors and equipment checked, pre-op evaluation, timeout performed and anesthesia consent Lidocaine 1% used for infiltration and patient sedated Hand hygiene performed  and maximum sterile barriers used  Catheter size: 9 Fr Sheath introducer Procedure performed using ultrasound guided technique. Ultrasound Notes:anatomy identified, needle tip was noted to be adjacent to the nerve/plexus identified, no ultrasound evidence of intravascular and/or intraneural injection and image(s) printed for medical record Attempts: 1 Following insertion, line sutured and dressing applied. Post procedure assessment: blood return through all ports, free fluid flow and no air  Patient tolerated the procedure well with no immediate complications.

## 2018-03-04 NOTE — Anesthesia Procedure Notes (Signed)
Arterial Line Insertion Start/End7/03/2018 6:40 AM, 03/04/2018 6:45 AM Performed by: Shireen QuanButler, Milayna Rotenberg R, CRNA, CRNA  Patient location: Pre-op. Preanesthetic checklist: patient identified, IV checked, site marked, risks and benefits discussed, surgical consent, monitors and equipment checked, pre-op evaluation, timeout performed and anesthesia consent Lidocaine 1% used for infiltration and patient sedated Right, radial was placed Catheter size: 20 G Hand hygiene performed , maximum sterile barriers used  and Seldinger technique used  Attempts: 1 Procedure performed without using ultrasound guided technique. Following insertion, dressing applied and Biopatch. Post procedure assessment: normal  Patient tolerated the procedure well with no immediate complications.

## 2018-03-04 NOTE — Progress Notes (Signed)
RWP started, RT placed pt on 40/4. Tolerating well. Will continue to monitor closely.

## 2018-03-04 NOTE — Progress Notes (Signed)
Patient's temperature increased to 38.9.  RN gave patient a cool bath, lowered room temp, and placed just a sheet over patient.  Temperature only improved to 38.5.  After patient's bath, Blood pressure dropped to 70's/50's, requiring increase in Phenylnephrine to 20 mcg from 8 mcg and a third albumin given.  Blood pressure improved.  PAD still low 3-5.  Cardiac index 2.81.  Called MD Donata ClayVan Trigt, new orders received.  MD advised to hold on extubation until patients temperature 38.2 or less.

## 2018-03-04 NOTE — Progress Notes (Signed)
MD Donata ClayVan Trigt rounding on patient, advanced swan to 42 cm.  MD assessed patient vitals and advised okay for RN to proceed with rapid wean to extubate patient.

## 2018-03-04 NOTE — Progress Notes (Signed)
Pre Procedure note for inpatients:   Nicholas [name] has been scheduled for Procedure(s): CORONARY ARTERY BYPASS GRAFTING (CABG) (N/A) TRANSESOPHAGEAL ECHOCARDIOGRAM (TEE) (N/A) today. The various methods of treatment have been discussed with the patient. After consideration of the risks, benefits and treatment options the patient has consented to the planned procedure.   The patient has been seen and labs reviewed. There are no changes in the patient's condition to prevent proceeding with the planned procedure today.I discussed with the patient the that a clinical student observer A. Tiburcio PeaHarris will be present for the operation and he consents  Recent labs:  Lab Results  Component Value Date   WBC 7.4 03/03/2018   HGB 12.7 (L) 03/03/2018   HCT 38.0 (L) 03/03/2018   PLT 216 03/03/2018   GLUCOSE 134 (H) 03/02/2018   CHOL 200 02/28/2018   TRIG 273 (H) 02/28/2018   HDL 33 (L) 02/28/2018   LDLCALC 112 (H) 02/28/2018   ALT 20 03/02/2018   AST 7 (L) 03/02/2018   NA 140 03/02/2018   K 4.0 03/02/2018   CL 108 03/02/2018   CREATININE 0.82 03/02/2018   BUN 7 (L) 03/02/2018   CO2 24 03/02/2018   TSH 1.150 03/02/2018   INR 1.01 03/02/2018   HGBA1C 7.6 (H) 03/02/2018    Kerin PernaPeter Van Trigt III, MD 03/04/2018 7:00 AM

## 2018-03-04 NOTE — Progress Notes (Signed)
CT surgery p.m. Rounds  Hemodynamically stable on ventilator Low-grade fever 38.1 Ventilator changes made now progressing towards ventilator wean Postop chest x-ray satisfactory

## 2018-03-04 NOTE — Anesthesia Postprocedure Evaluation (Signed)
Anesthesia Post Note  Patient: Nicholas [name]  Procedure(s) Performed: CORONARY ARTERY BYPASS GRAFTING (CABG) x 3: -FREE LIMA to LAD, -SVG to DIAGONAL, -SVG to OM; ENDOSCOPIC HARVEST GREATER SAPHENOUS VEIN: -Right Thigh   (N/A Chest) TRANSESOPHAGEAL ECHOCARDIOGRAM (TEE) (N/A )     Patient location during evaluation: SICU Anesthesia Type: General Level of consciousness: sedated and patient remains intubated per anesthesia plan Pain management: pain level controlled Vital Signs Assessment: post-procedure vital signs reviewed and stable Respiratory status: respiratory function stable, patient remains intubated per anesthesia plan and patient on ventilator - see flowsheet for VS Cardiovascular status: blood pressure returned to baseline and stable Postop Assessment: no apparent nausea or vomiting Anesthetic complications: no    Last Vitals:  Vitals:   03/04/18 1526 03/04/18 1539  BP:    Pulse: 99   Resp: 19   Temp: 37.6 C   SpO2:  100%    Last Pain:  Vitals:   03/04/18 0516  TempSrc:   PainSc: 0-No pain                 Cecile HearingStephen Edward Louvinia Cumbo

## 2018-03-04 NOTE — Op Note (Signed)
NAMClayborn [name]: Hillmann, Joanne MEDICAL RECORD ZO:[id number] ACCOUNT [id number] DATE OF BIRTH:16-Feb-1956 FACILITY: [facility] LOCATION: [facility] PHYSICIAN:PETER VAN TRIGT III, MD  OPERATIVE REPORT  DATE OF PROCEDURE:  03/04/2018  OPERATION: 1.  Coronary artery bypass grafting x3 (free left internal mammary artery graft to left anterior descending, saphenous vein graft to obtuse marginal, saphenous vein graft to diagonal).   2.  Endoscopic harvest of left leg greater saphenous vein, exposure of right leg greater saphenous vein, which was not harvested because it was too small.  ANESTHESIA:  General by Dr. Cristela Feltuck.  PREOPERATIVE DIAGNOSES:  Non-ST elevation MI, unstable angina, severe 3-vessel coronary artery disease.  POSTOPERATIVE DIAGNOSES:  Non-ST elevation MI, unstable angina, severe 3-vessel coronary artery disease.  SURGEON:  Mikey BussingPeter Van Trigt III, MD  ASSISTANT:  Lowella DandyErin Barrett, PA-C  PROCEDURE IN DETAIL:  After the patient had been counseled prior to surgery regarding the expected benefits of CABG for treatment of his severe CAD and the procedure had been discussed in detail including the expected benefits of improved survival, the  alternatives of medical therapy and the risks of stroke, bleeding, blood transfusion, postoperative infection, postoperative pulmonary problems including pleural effusion, postoperative arrhythmias, postoperative organ failure, postoperative infection, and death, the patient was brought to the operating room and placed supine on the operating table.  General  anesthesia was induced under invasive hemodynamic monitoring.  A transesophageal echo probe was placed by the anesthesia team.  The chest, abdomen and legs were prepped and draped as a sterile field.  A proper time-out was performed.  A sternal incision was made as the saphenous vein was harvested from the left leg using endoscopic technique.  The left internal mammary artery was then harvested as a free graft because of a  proximal hemodynamically significant left subclavian stenosis.   The sternal retractor was placed, and the pericardium was opened and suspended.  The lungs were examined and found to be with evidence of heavy smoking--tobacco use.  Pursestrings were placed in the ascending aorta and right atrium, and after heparin was administered, the patient was cannulated and placed on cardiopulmonary bypass.  The coronaries were identified for grafting.  The LAD, diagonal and OM were found to  be adequate targets.  The distal RCA was atretic and too small to graft.  Cardioplegia cannulas were placed in both antegrade and retrograde cold blood cardioplegia, and the patient was cooled to 32 degrees.  The aortic crossclamp was applied, and 1 L of  cold blood cardioplegia was delivered in split doses between the antegrade aortic and retrograde coronary sinus catheters.  There was good cardioplegic arrest, and supple temperature dropped less than 12 degrees.  Cardioplegia was delivered every 20  minutes while the cross clamp was in place.  The distal coronary anastomoses were performed.  The first distal anastomosis was to the distal circumflex marginal.  There was a proximal 80% to 90% stenosis.  Reverse saphenous vein was sewn end-to-side with running 7-0 Prolene with good flow through  the graft.  Cardioplegia was redosed.  The second distal anastomosis was to the diagonal branch of the LAD.  This was a large 1.5 to 1.6 mm vessel.  Reverse saphenous vein was sewn end-to-side with running 7-0 Prolene with good flow through the graft.  Cardioplegia was redosed.  The third distal anastomosis was to the mid-LAD.  There was a proximal 95% stenosis.  The free left IMA graft was sewn end-to-side with running 8-0 Prolene.  There was good flow through the graft.  Cardioplegia was redosed.  While the crossclamp was still in place, 2 proximal vein anastomoses were performed using a 4.5 mm punch and running 6-0 Prolene.  Prior  to tying down the final proximal anastomosis, air was vented from the coronaries with a dose of retrograde warm blood  cardioplegia.  The crossclamp was removed.  The vein grafts were deaired and opened, and each had good flow.  The heart was cardioverted back to a regular rhythm.  The proximal free mammary artery graft was then sewn to the hood of the vein graft to the diagonal.  This was constructed with a running 7-0 Prolene, and after the bulldog clamps were removed, there was good flow in all grafts with hemostasis.  The patient was rewarmed and reperfused.  Temporary pacing wires were applied.  The lungs were expanded.  The ventilator was resumed.  The patient was weaned from cardiopulmonary bypass without difficulty.  Hemodynamics were stable.  Echo showed good LV  systolic function.  Protamine was administered without adverse reaction.  The cannulas were removed.  The mediastinum was irrigated.  The superior pericardial fat was closed over the aorta.  The anterior mediastinal and left pleural chest tubes were  placed and brought out through separate incisions.  The sternum was closed with wire.  The patient remained stable.  The pectoralis fascia was closed with a running #1 Vicryl.  The subcutaneous and skin layers were closed with a running Vicryl and  sterile dressings were applied.  Total cardiopulmonary bypass time was 123 minutes.  LN/NUANCE  D:03/04/2018 T:03/04/2018 JOB:[id number]

## 2018-03-04 NOTE — Procedures (Signed)
Extubation Procedure Note  Patient Details:   Name: Nicholas [name] DOB: [birth year] MRN: [id number]  Patient got a NIF -25 and VC 650. Patient extubated to 4L Nicholas [name]. No stridor noted. RN at beside doing IS with patient.   Evaluation  O2 sats: stable throughout Complications: No apparent complications Patient did tolerate procedure well. Bilateral Breath Sounds: Clear   Yes  Nicholas [name] 03/04/2018, 2350

## 2018-03-04 NOTE — Anesthesia Procedure Notes (Signed)
Central Venous Catheter Insertion Performed by: Kipp BroodJoslin, Rebekkah Powless, MD, anesthesiologist Start/End7/03/2018 6:55 AM, 03/04/2018 7:05 AM Patient location: Pre-op. Preanesthetic checklist: patient identified, IV checked, site marked, risks and benefits discussed, surgical consent, monitors and equipment checked, pre-op evaluation, timeout performed and anesthesia consent Hand hygiene performed  and maximum sterile barriers used  PA cath was placed.Swan type:thermodilution Procedure performed without using ultrasound guided technique. Attempts: 1 Following insertion, line sutured, dressing applied and Biopatch. Post procedure assessment: blood return through all ports  Patient tolerated the procedure well with no immediate complications.

## 2018-03-04 NOTE — Progress Notes (Signed)
Called by daughter that patient was having chest pain. Per patient and daughter's at bedside pain was 0.5, less than 1. Upon coming to the room patient stated that pain was present only for second and that pain is gone. He stated that he is chest pain free at this moment. Patient and family advised to inform RN about any change in condition. Patient verbalized understanding and continues to rest.  Will continue to monitor.  Aarionna Germer, RN

## 2018-03-04 NOTE — Anesthesia Procedure Notes (Signed)
Procedure Name: Intubation Date/Time: 03/04/2018 7:48 AM Performed by: Moshe Salisbury, CRNA Pre-anesthesia Checklist: Patient identified, Emergency Drugs available, Suction available and Patient being monitored Patient Re-evaluated:Patient Re-evaluated prior to induction Oxygen Delivery Method: Circle System Utilized Preoxygenation: Pre-oxygenation with 100% oxygen Induction Type: IV induction Ventilation: Mask ventilation without difficulty Laryngoscope Size: Mac and 4 Grade View: Grade III Tube type: Oral Tube size: 8.0 mm Number of attempts: 1 Airway Equipment and Method: Stylet and Oral airway Placement Confirmation: ETT inserted through vocal cords under direct vision,  positive ETCO2 and breath sounds checked- equal and bilateral Secured at: 22 cm Tube secured with: Tape Dental Injury: Teeth and Oropharynx as per pre-operative assessment

## 2018-03-04 NOTE — Plan of Care (Signed)
  Problem: Education: Goal: Knowledge of General Education information will improve Outcome: Progressing   Problem: Health Behavior/Discharge Planning: Goal: Ability to manage health-related needs will improve Outcome: Progressing   Problem: Clinical Measurements: Goal: Ability to maintain clinical measurements within normal limits will improve Outcome: Progressing Goal: Will remain free from infection Outcome: Progressing Goal: Diagnostic test results will improve Outcome: Progressing Goal: Respiratory complications will improve Outcome: Progressing Goal: Cardiovascular complication will be avoided Outcome: Progressing   Problem: Activity: Goal: Risk for activity intolerance will decrease Outcome: Progressing   Problem: Nutrition: Goal: Adequate nutrition will be maintained Outcome: Progressing   Problem: Coping: Goal: Level of anxiety will decrease Outcome: Progressing   Problem: Elimination: Goal: Will not experience complications related to bowel motility Outcome: Progressing Goal: Will not experience complications related to urinary retention Outcome: Progressing   Problem: Pain Managment: Goal: General experience of comfort will improve Outcome: Progressing   Problem: Safety: Goal: Ability to remain free from injury will improve Outcome: Progressing   Problem: Skin Integrity: Goal: Risk for impaired skin integrity will decrease Outcome: Progressing   Problem: Education: Goal: Ability to demonstrate proper wound care will improve Outcome: Progressing Goal: Knowledge of disease or condition will improve Outcome: Progressing Goal: Knowledge of the prescribed therapeutic regimen will improve Outcome: Progressing   Problem: Activity: Goal: Risk for activity intolerance will decrease Outcome: Progressing   Problem: Cardiac: Goal: Hemodynamic stability will improve Outcome: Progressing   Problem: Clinical Measurements: Goal: Postoperative  complications will be avoided or minimized Outcome: Progressing   Problem: Respiratory: Goal: Respiratory status will improve Outcome: Progressing   Problem: Skin Integrity: Goal: Wound healing without signs and symptoms of infection Outcome: Progressing Goal: Risk for impaired skin integrity will decrease Outcome: Progressing   Problem: Urinary Elimination: Goal: Ability to achieve and maintain adequate renal perfusion and functioning will improve Outcome: Progressing   

## 2018-03-04 NOTE — Transfer of Care (Signed)
Immediate Anesthesia Transfer of Care Note  Patient: Nicholas [name]  Procedure(s) Performed: CORONARY ARTERY BYPASS GRAFTING (CABG) x 3: -FREE LIMA to LAD, -SVG to DIAGONAL, -SVG to OM; ENDOSCOPIC HARVEST GREATER SAPHENOUS VEIN: -Right Thigh   (N/A Chest) TRANSESOPHAGEAL ECHOCARDIOGRAM (TEE) (N/A )  Patient Location: ICU  Anesthesia Type:General  Level of Consciousness: unresponsive and Patient remains intubated per anesthesia plan  Airway & Oxygen Therapy: Patient remains intubated per anesthesia plan and Patient placed on Ventilator (see vital sign flow sheet for setting)  Post-op Assessment: Report given to RN and Post -op Vital signs reviewed and stable  Post vital signs: Reviewed and stable  Last Vitals:  Vitals Value Taken Time  BP    Temp    Pulse    Resp    SpO2      Last Pain:  Vitals:   03/04/18 0516  TempSrc:   PainSc: 0-No pain      Patients Stated Pain Goal: 0 (03/03/18 0834)  Complications: No apparent anesthesia complications

## 2018-03-05 ENCOUNTER — Encounter (HOSPITAL_COMMUNITY): Payer: Self-pay | Admitting: Cardiothoracic Surgery

## 2018-03-05 ENCOUNTER — Inpatient Hospital Stay (HOSPITAL_COMMUNITY): Payer: BLUE CROSS/BLUE SHIELD

## 2018-03-05 DIAGNOSIS — Z951 Presence of aortocoronary bypass graft: Secondary | ICD-10-CM

## 2018-03-05 LAB — CBC
HCT: 26.7 % — ABNORMAL LOW (ref 39.0–52.0)
HEMATOCRIT: 28.1 % — AB (ref 39.0–52.0)
HEMOGLOBIN: 9 g/dL — AB (ref 13.0–17.0)
Hemoglobin: 8.6 g/dL — ABNORMAL LOW (ref 13.0–17.0)
MCH: 28.2 pg (ref 26.0–34.0)
MCH: 27.9 pg (ref 26.0–34.0)
MCHC: 32.2 g/dL (ref 30.0–36.0)
MCHC: 32 g/dL (ref 30.0–36.0)
MCV: 87.5 fL (ref 78.0–100.0)
MCV: 87 fL (ref 78.0–100.0)
PLATELETS: 129 10*3/uL — AB (ref 150–400)
Platelets: 140 10*3/uL — ABNORMAL LOW (ref 150–400)
RBC: 3.05 MIL/uL — AB (ref 4.22–5.81)
RBC: 3.23 MIL/uL — AB (ref 4.22–5.81)
RDW: 13.5 % (ref 11.5–15.5)
RDW: 13.6 % (ref 11.5–15.5)
WBC: 6.9 10*3/uL (ref 4.0–10.5)
WBC: 9.5 10*3/uL (ref 4.0–10.5)

## 2018-03-05 LAB — POCT I-STAT 3, ART BLOOD GAS (G3+)
ACID-BASE DEFICIT: 5 mmol/L — AB (ref 0.0–2.0)
Acid-base deficit: 4 mmol/L — ABNORMAL HIGH (ref 0.0–2.0)
Acid-base deficit: 4 mmol/L — ABNORMAL HIGH (ref 0.0–2.0)
BICARBONATE: 20 mmol/L (ref 20.0–28.0)
BICARBONATE: 21.6 mmol/L (ref 20.0–28.0)
Bicarbonate: 19.8 mmol/L — ABNORMAL LOW (ref 20.0–28.0)
O2 Saturation: 100 %
O2 Saturation: 99 %
O2 Saturation: 95 %
PCO2 ART: 33 mmHg (ref 32.0–48.0)
PH ART: 7.346 — AB (ref 7.350–7.450)
PH ART: 7.348 — AB (ref 7.350–7.450)
PH ART: 7.389 (ref 7.350–7.450)
TCO2: 21 mmol/L — ABNORMAL LOW (ref 22–32)
TCO2: 23 mmol/L (ref 22–32)
TCO2: 21 mmol/L — AB (ref 22–32)
pCO2 arterial: 36.4 mmHg (ref 32.0–48.0)
pCO2 arterial: 39.5 mmHg (ref 32.0–48.0)
pO2, Arterial: 164 mmHg — ABNORMAL HIGH (ref 83.0–108.0)
pO2, Arterial: 220 mmHg — ABNORMAL HIGH (ref 83.0–108.0)
pO2, Arterial: 79 mmHg — ABNORMAL LOW (ref 83.0–108.0)

## 2018-03-05 LAB — GLUCOSE, CAPILLARY
GLUCOSE-CAPILLARY: 103 mg/dL — AB (ref 70–99)
GLUCOSE-CAPILLARY: 112 mg/dL — AB (ref 70–99)
GLUCOSE-CAPILLARY: 117 mg/dL — AB (ref 70–99)
GLUCOSE-CAPILLARY: 121 mg/dL — AB (ref 70–99)
GLUCOSE-CAPILLARY: 151 mg/dL — AB (ref 70–99)
GLUCOSE-CAPILLARY: 174 mg/dL — AB (ref 70–99)
Glucose-Capillary: 160 mg/dL — ABNORMAL HIGH (ref 70–99)
Glucose-Capillary: 122 mg/dL — ABNORMAL HIGH (ref 70–99)
Glucose-Capillary: 113 mg/dL — ABNORMAL HIGH (ref 70–99)
Glucose-Capillary: 105 mg/dL — ABNORMAL HIGH (ref 70–99)
Glucose-Capillary: 110 mg/dL — ABNORMAL HIGH (ref 70–99)
Glucose-Capillary: 112 mg/dL — ABNORMAL HIGH (ref 70–99)
Glucose-Capillary: 123 mg/dL — ABNORMAL HIGH (ref 70–99)
Glucose-Capillary: 116 mg/dL — ABNORMAL HIGH (ref 70–99)
Glucose-Capillary: 183 mg/dL — ABNORMAL HIGH (ref 70–99)

## 2018-03-05 LAB — BASIC METABOLIC PANEL
ANION GAP: 5 (ref 5–15)
BUN: 7 mg/dL — ABNORMAL LOW (ref 8–23)
CALCIUM: 7.9 mg/dL — AB (ref 8.9–10.3)
CO2: 24 mmol/L (ref 22–32)
Chloride: 111 mmol/L (ref 98–111)
Creatinine, Ser: 0.79 mg/dL (ref 0.61–1.24)
GFR calc Af Amer: >60 mL/min (ref >60)
Glucose, Bld: 117 mg/dL — ABNORMAL HIGH (ref 70–99)
Potassium: 4 mmol/L (ref 3.5–5.1)
Sodium: 140 mmol/L (ref 135–145)

## 2018-03-05 LAB — POCT I-STAT, CHEM 8
BUN: 7 mg/dL — ABNORMAL LOW (ref 8–23)
CHLORIDE: 100 mmol/L (ref 98–111)
Calcium, Ion: 1.18 mmol/L (ref 1.15–1.40)
Creatinine, Ser: 0.7 mg/dL (ref 0.61–1.24)
GLUCOSE: 183 mg/dL — AB (ref 70–99)
HEMATOCRIT: 27 % — AB (ref 39.0–52.0)
Hemoglobin: 9.2 g/dL — ABNORMAL LOW (ref 13.0–17.0)
POTASSIUM: 3.9 mmol/L (ref 3.5–5.1)
Sodium: 136 mmol/L (ref 135–145)
TCO2: 22 mmol/L (ref 22–32)

## 2018-03-05 LAB — CREATININE, SERUM
CREATININE: 0.85 mg/dL (ref 0.61–1.24)
GFR calc Af Amer: >60 mL/min (ref >60)

## 2018-03-05 LAB — MAGNESIUM
MAGNESIUM: 2.1 mg/dL (ref 1.7–2.4)
Magnesium: 2.4 mg/dL (ref 1.7–2.4)

## 2018-03-05 MED ORDER — LISINOPRIL 2.5 MG PO TABS
2.5000 mg | ORAL_TABLET | Freq: Every day | ORAL | Status: DC
  Administered 2018-03-05: 2.5 mg via ORAL
  Filled 2018-03-05: qty 1

## 2018-03-05 MED ORDER — ENOXAPARIN SODIUM 40 MG/0.4ML ~~LOC~~ SOLN
40.0000 mg | Freq: Every day | SUBCUTANEOUS | Status: DC
  Administered 2018-03-05 – 2018-03-08 (×4): 40 mg via SUBCUTANEOUS
  Filled 2018-03-05 (×4): qty 0.4

## 2018-03-05 MED ORDER — INSULIN ASPART 100 UNIT/ML ~~LOC~~ SOLN
0.0000 [IU] | SUBCUTANEOUS | Status: DC
  Administered 2018-03-05 (×2): 4 [IU] via SUBCUTANEOUS
  Administered 2018-03-06 (×2): 2 [IU] via SUBCUTANEOUS

## 2018-03-05 MED ORDER — SODIUM BICARBONATE 8.4 % IV SOLN
25.0000 meq | Freq: Once | INTRAVENOUS | Status: AC
  Administered 2018-03-05: 25 meq via INTRAVENOUS
  Filled 2018-03-05: qty 50

## 2018-03-05 MED ORDER — FUROSEMIDE 10 MG/ML IJ SOLN
20.0000 mg | Freq: Every day | INTRAMUSCULAR | Status: DC
  Administered 2018-03-05: 20 mg via INTRAVENOUS
  Filled 2018-03-05: qty 2

## 2018-03-05 MED ORDER — FERROUS SULFATE 325 (65 FE) MG PO TABS
325.0000 mg | ORAL_TABLET | Freq: Three times a day (TID) | ORAL | Status: DC
  Administered 2018-03-05 (×2): 325 mg via ORAL
  Filled 2018-03-05 (×2): qty 1

## 2018-03-05 MED ORDER — ORAL CARE MOUTH RINSE
15.0000 mL | Freq: Two times a day (BID) | OROMUCOSAL | Status: DC
  Administered 2018-03-05 – 2018-03-08 (×7): 15 mL via OROMUCOSAL

## 2018-03-05 MED ORDER — LABETALOL HCL 5 MG/ML IV SOLN: 10.0000 mg | INTRAVENOUS | Status: DC | PRN

## 2018-03-05 MED ORDER — INSULIN DETEMIR 100 UNIT/ML ~~LOC~~ SOLN
20.0000 [IU] | Freq: Once | SUBCUTANEOUS | Status: AC
  Administered 2018-03-05: 20 [IU] via SUBCUTANEOUS
  Filled 2018-03-05: qty 0.2

## 2018-03-05 MED ORDER — INSULIN DETEMIR 100 UNIT/ML ~~LOC~~ SOLN
20.0000 [IU] | Freq: Every day | SUBCUTANEOUS | Status: DC
  Administered 2018-03-06: 20 [IU] via SUBCUTANEOUS
  Filled 2018-03-05 (×2): qty 0.2

## 2018-03-05 MED FILL — Potassium Chloride Inj 2 mEq/ML: INTRAVENOUS | Qty: 40 | Status: AC

## 2018-03-05 MED FILL — Heparin Sodium (Porcine) Inj 1000 Unit/ML: INTRAMUSCULAR | Qty: 30 | Status: AC

## 2018-03-05 MED FILL — Magnesium Sulfate Inj 50%: INTRAMUSCULAR | Qty: 10 | Status: AC

## 2018-03-05 NOTE — Discharge Summary (Addendum)
Physician Discharge Summary  Patient ID: Nicholas [name] MRN: [id number] DOB/AGE: 1956/05/24 62 y.o.  Admit date: 02/27/2018 Discharge date: 03/09/2018  Admission Diagnoses:  Patient Active Problem List   Diagnosis Date Noted  . Unstable angina (HCC) 02/28/2018  . Stricture of artery (HCC) 02/02/2012  . Subclavian arterial stenosis (HCC) 01/19/2012   Discharge Diagnoses:   Patient Active Problem List   Diagnosis Date Noted  . 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  . Unstable angina (HCC) 02/28/2018  . Stricture of artery (HCC) 02/02/2012  . Subclavian arterial stenosis (HCC) 01/19/2012   Discharged Condition: good  History of Present Illness:  Nicholas [name] is a 62 yo [place] male with known history of DM, HTN, Hyperlipidemia and subclavian stenosis.  He presented to the ED with complaints of severe chest pain that developed after mowing his lawn.  On evaluation he admitted he has been experiencing chest discomfort for the past 2 - 3 months.  These have been accompanied by shortness of breath and some radiation into his right arm.  Upon arrival to ED NTG was administered with relief of chest pain.  Workup in the ED showed no significant EKG changes, but he was bradycardic with non specific t wave changes in the lateral leads.  Troponin levels were positive.  He was admitted for NSTEMI and further care.      Hospital Course:   During his stay Echocardiogram was obtained which showed a preserved EF with inferior wall hypokinesis and mild Mitral Regurgitation.  He underwent cardiac catheterization on 03/01/2018 which showed multivessel CAD.  It was felt coronary bypass grafting would be indicated and TCTS consult was obtained.  He was evaluated by Dr. Donata Clay who was in agreement the patient would benefit from coronary bypass grafting.  He did not recommended subsequent placement of subclavian stent during the procedure.  The risk and benefits of the procedure were explained to the patient and he was agreeable  to proceed.  He was taken to the operating room on 03/04/2018.  He underwent CABG x 3 utilizing FREE LIMA to LAD, SVG to Diagonal, and SVG to OM.  He also underwent endoscopic harvest of greater saphenous vein from his right leg.  He tolerated the procedure without difficulty and was taken to the SICU in stable condition.  He was extubated the evening of surgery.  During his stay in the SICU the patient was weaned off Milrinone drip as tolerated.  His chest tubes and arterial lines were removed without difficulty.  He was hypertensive and started on low dose BB.  These were titrated as tolerated for better control.  He had issues with profound nausea post operatively.  He was maintaining NSR and transferred to the telemetry unit on 03/06/2018.  The patient continues to make progress.  His nausea has significantly improved.  He continues to maintain NSR.  His pacing wires have been removed prior to discharge.  His beta blocker and ACE inhibitor were titrated accordingly.  He has been transitioned to his home regimen of Metformin.  He is ambulating without significant difficulty.  His incisions are healing without evidence of infection.  He is medically stable for discharge home today.   Significant Diagnostic Studies: angiography:    Prox LAD lesion is 70% stenosed.  1st Diag lesion is 70% stenosed.  Ramus lesion is 70% stenosed.  Ost 1st Mrg lesion is 95% stenosed.  1st Mrg lesion is 60% stenosed with 60% stenosed side branch in Lat 1st Mrg.  Mid RCA lesion  is 100% stenosed.  LV end diastolic pressure is normal.  Treatments: surgery:   1.  Coronary artery bypass grafting x3 (free left internal mammary artery graft to left anterior descending, saphenous vein graft to obtuse marginal, saphenous vein graft to diagonal).   2.  Endoscopic harvest of right leg greater saphenous vein, exposure of left leg greater saphenous vein, which was not harvested because it was too small.   Discharge  Exam: Blood pressure 127/76, pulse 86, temperature 97.6 F (36.4 C), temperature source Oral, resp. rate (!) 23, height 5\' 2"  (1.575 m), weight 64.5 kg (142 lb 3.2 oz), SpO2 95 %.  General appearance: alert, cooperative and no distress Heart: sinus tachycardia Lungs: clear to auscultation bilaterally Abdomen: soft, non-tender; bowel sounds normal; no masses,  no organomegaly Extremities: extremities normal, atraumatic, no cyanosis or edema Wound: clean and dry    Disposition: Discharge disposition: 01-Home or Self Care       Discharge Instructions    Amb Referral to Cardiac Rehabilitation   Complete by:  As directed    Diagnosis:  CABG   CABG X ___:  3     Allergies as of 03/09/2018      Reactions   Eggs Or Egg-derived Products Itching, Rash      Medication List    STOP taking these medications   atenolol 50 MG tablet Commonly known as:  TENORMIN   lisinopril 2.5 MG tablet Commonly known as:  PRINIVIL,ZESTRIL     TAKE these medications   acetaminophen 500 MG tablet Commonly known as:  TYLENOL Take 2 tablets (1,000 mg total) by mouth every 6 (six) hours.   aspirin 325 MG EC tablet Take 1 tablet (325 mg total) by mouth daily. What changed:    medication strength  how much to take  when to take this   atorvastatin 80 MG tablet Commonly known as:  LIPITOR Take 1 tablet (80 mg total) by mouth daily at 6 PM.   finasteride 5 MG tablet Commonly known as:  PROSCAR Take 5 mg by mouth daily.   metFORMIN 500 MG tablet Commonly known as:  GLUCOPHAGE Take 500 mg by mouth 2 (two) times daily with a meal.   metoprolol tartrate 25 MG tablet Commonly known as:  LOPRESSOR Take 1 tablet (25 mg total) by mouth 2 (two) times daily.   oxyCODONE 5 MG immediate release tablet Commonly known as:  Oxy IR/ROXICODONE Take 1 tablet (5 mg total) by mouth every 6 (six) hours as needed for severe pain.      Follow-up Information    Kerin PernaVan Trigt, Lateisha Thurlow, MD Follow up on  04/10/2018.   Specialty:  Cardiothoracic Surgery Why:  Appointment is at 12:00, [facility] 11:[facility] located on first floor of our office building Contact information: [address] [place] [address] [phone]        Abelino DerrickKilroy, Luke K, PA-C Follow up on 03/27/2018.   Specialties:  Cardiology, Radiology Why:  Appointment is at 11:00 Contact information: [address] [place] [address] [phone]        Amelia JoLewit, Eliot, MD. Call in 1 day(s).   Specialty:  Neurology Contact information: [address] [place] [address] [phone]        Antoine PocheBranch, Jonathan F, MD .   Specialty:  Cardiology Contact information: [address] [place] [address] [phone]          The patient has been discharged  on:   1.Beta Blocker:  Yes [ x  ]                              No   [   ]                              If No, reason:  2.Ace Inhibitor/ARB: Yes [   ]                                     No  [ x   ]                                     If No, reason: normotensive  3.Statin:   Yes [ x  ]                  No  [   ]                  If No, reason:  4.Ecasa:  Yes  [ x  ]                  No   [   ]                  If No, reason:   Signed: Sharlene Dory 03/09/2018, 7:51 AM  patient examined and medical record reviewed,agree with above note. Kathlee Nations Trigt III 03/10/2018

## 2018-03-05 NOTE — Discharge Instructions (Signed)

## 2018-03-05 NOTE — Plan of Care (Signed)
  Problem: Activity: Goal: Risk for activity intolerance will decrease Outcome: Progressing Note:  Patient only able to ambulate 60 ft this evening. Stated he was too cold/too nauseated to continue ambulating.   Problem: Nutrition: Goal: Adequate nutrition will be maintained Outcome: Not Progressing Note:  Patient with continued nausea; unable to tolerate diet.

## 2018-03-05 NOTE — Progress Notes (Signed)
TCTS BRIEF SICU PROGRESS NOTE  1 Day Post-Op  S/P Procedure(s) (LRB): CORONARY ARTERY BYPASS GRAFTING (CABG) x 3: -FREE LIMA to LAD, -SVG to DIAGONAL, -SVG to OM; ENDOSCOPIC HARVEST GREATER SAPHENOUS VEIN: -Right Thigh   (N/A) TRANSESOPHAGEAL ECHOCARDIOGRAM (TEE) (N/A)   Stable day NSR w/ BP 140-160 systolic Breathing comfortably w/ O2 sats 94% on RA UOP adequate  Plan: Continue current plan  Purcell Nailslarence H Owen, MD 03/05/2018 8:49 PM

## 2018-03-05 NOTE — Progress Notes (Addendum)
TCTS DAILY ICU PROGRESS NOTE                   [address]            [place] [address]          [phone]   1 Day Post-Op Procedure(s) (LRB): CORONARY ARTERY BYPASS GRAFTING (CABG) x 3: -FREE LIMA to LAD, -SVG to DIAGONAL, -SVG to OM; ENDOSCOPIC HARVEST GREATER SAPHENOUS VEIN: -Right Thigh   (N/A) TRANSESOPHAGEAL ECHOCARDIOGRAM (TEE) (N/A)  Total Length of Stay:  LOS: 4 days   Subjective:  Daughter at bedside.  Patient has some discomfort from the wires.  Denies N/V  Objective: Vital signs in last 24 hours: Temp:  [97.5 F (36.4 C)-101.7 F (38.7 C)] 99.1 F (37.3 C) (07/09 0700) Pulse Rate:  [69-111] 75 (07/09 0700) Cardiac Rhythm: Normal sinus rhythm (07/09 0400) Resp:  [8-31] 28 (07/09 0700) BP: (85-111)/(65-81) 111/69 (07/08 2011) SpO2:  [93 %-100 %] 93 % (07/09 0700) Arterial Line BP: (80-161)/(43-72) 138/53 (07/09 0700) FiO2 (%):  [40 %-50 %] 40 % (07/08 2325) Weight:  [149 lb 11.1 oz (67.9 kg)] 149 lb 11.1 oz (67.9 kg) (07/09 0500)  Filed Weights   03/03/18 0438 03/04/18 0444 03/05/18 0500  Weight: 138 lb 6.4 oz (62.8 kg) 136 lb 4.8 oz (61.8 kg) 149 lb 11.1 oz (67.9 kg)    Weight change: 13 lb 6.3 oz (6.075 kg)   Hemodynamic parameters for last 24 hours: PAP: (11-43)/(0-22) 36/12 CO:  [3.4 L/min-4.8 L/min] 4.7 L/min CI:  [2.1 L/min/m2-3 L/min/m2] 2.9 L/min/m2  Intake/Output from previous day: 07/08 0701 - 07/09 0700 In: 6486.7 [P.O.:90; I.V.:3294.8; Blood:480; NG/GT:60; IV Piggyback:2561.9] Out: 5230 [Urine:4045; Blood:705; Chest Tube:480]  Current Meds: Scheduled Meds: . acetaminophen  1,000 mg Oral Q6H   Or  . acetaminophen (TYLENOL) oral liquid 160 mg/5 mL  1,000 mg Per Tube Q6H  . aspirin EC  325 mg Oral Daily   Or  . aspirin  324 mg Per Tube Daily  . atorvastatin  80 mg Oral q1800  . bisacodyl  10 mg Oral Daily   Or  . bisacodyl  10 mg Rectal Daily  . Chlorhexidine Gluconate Cloth  6 each Topical Daily  . docusate sodium   200 mg Oral Daily  . enoxaparin (LOVENOX) injection  40 mg Subcutaneous QHS  . finasteride  5 mg Oral Daily  . insulin aspart  0-24 Units Subcutaneous Q4H  . insulin detemir  20 Units Subcutaneous Once  . [START ON 03/06/2018] insulin detemir  20 Units Subcutaneous Daily  . insulin regular  0-10 Units Intravenous TID WC  . levalbuterol  0.63 mg Nebulization TID  . mouth rinse  15 mL Mouth Rinse BID  . metoCLOPramide (REGLAN) injection  10 mg Intravenous Q6H  . metoprolol tartrate  12.5 mg Oral BID   Or  . metoprolol tartrate  12.5 mg Per Tube BID  . mometasone-formoterol  2 puff Inhalation BID  . mupirocin ointment   Nasal BID  . [START ON 03/06/2018] pantoprazole  40 mg Oral Daily  . sodium chloride flush  3 mL Intravenous Q12H   Continuous Infusions: . sodium chloride 10 mL/hr at 03/05/18 0700  . sodium chloride    . sodium chloride 10 mL/hr at 03/04/18 1451  . sodium chloride 10 mL/hr at 03/05/18 0700  . albumin human Stopped (03/05/18 0010)  . cefUROXime (ZINACEF)  IV Stopped (03/05/18 0424)  . dexmedetomidine (PRECEDEX) IV infusion Stopped (03/04/18  1917)  . DOPamine    . insulin (NOVOLIN-R) infusion 1.6 mL/hr at 03/05/18 0700  . lactated ringers Stopped (03/04/18 1455)  . lactated ringers 20 mL/hr at 03/05/18 0700  . milrinone 0.25 mcg/kg/min (03/05/18 0700)  . nitroGLYCERIN Stopped (03/04/18 1456)  . phenylephrine (NEO-SYNEPHRINE) Adult infusion Stopped (03/05/18 0252)   PRN Meds:.sodium chloride, albumin human, metoprolol tartrate, midazolam, morphine injection, ondansetron (ZOFRAN) IV, oxyCODONE, sodium chloride flush, sodium chloride flush, traMADol  General appearance: alert, cooperative and no distress Heart: regular rate and rhythm Lungs: clear to auscultation bilaterally Abdomen: soft, non-tender; bowel sounds normal; no masses,  no organomegaly Extremities: edema trace Wound: aquacel on sternotomy, ecchymosis of EVH  Lab Results: CBC: Recent Labs     03/04/18 2018 03/04/18 2026 03/05/18 0355  WBC 7.9  --  6.9  HGB 9.6* 9.9* 8.6*  HCT 29.0* 29.0* 26.7*  PLT 134*  --  129*   BMET:  Recent Labs    03/04/18 2026 03/05/18 0355  NA 141 140  K 4.1 4.0  CL 108 111  CO2  --  24  GLUCOSE 104* 117*  BUN 7* 7*  CREATININE 0.70 0.79  CALCIUM  --  7.9*    CMET: Lab Results  Component Value Date   WBC 6.9 03/05/2018   HGB 8.6 (L) 03/05/2018   HCT 26.7 (L) 03/05/2018   PLT 129 (L) 03/05/2018   GLUCOSE 117 (H) 03/05/2018   CHOL 200 02/28/2018   TRIG 273 (H) 02/28/2018   HDL 33 (L) 02/28/2018   LDLCALC 112 (H) 02/28/2018   ALT 20 03/02/2018   AST 7 (L) 03/02/2018   NA 140 03/05/2018   K 4.0 03/05/2018   CL 111 03/05/2018   CREATININE 0.79 03/05/2018   BUN 7 (L) 03/05/2018   CO2 24 03/05/2018   TSH 1.150 03/02/2018   INR 1.39 03/04/2018   HGBA1C 7.6 (H) 03/02/2018      PT/INR:  Recent Labs    03/04/18 1431  LABPROT 16.9*  INR 1.39   Radiology: Dg Chest Port 1 View  Result Date: 03/04/2018 CLINICAL DATA:  Status post CABG.  History of hypertension. EXAM: PORTABLE CHEST 1 VIEW COMPARISON:  Chest radiograph February 27, 2018 FINDINGS: Cardiac silhouette is normal in size. Status post interval median sternotomy for CABG. Endotracheal tube tip projects 2.2 cm above the carina. Swan-Ganz catheter via RIGHT internal jugular venous approach with distal tip projecting in main pulmonary artery. Nasogastric tube past GE junction, distal tip out of field of view. LEFT chest tube with consolidation LEFT mid lung zone. No pleural effusion. No pneumothorax. Soft tissue planes and included osseous structures are non suspicious. Minimal subcutaneous gas LEFT neck. Multiple wires projected in the abdomen. Moderate RIGHT AC osteoarthrosis. IMPRESSION: 1. Interval median sternotomy for CABG. 2. Well positioned life-support lines. 3. LEFT mid lung zone consolidation associated with chest tube most compatible with contusion. Electronically Signed    By: Awilda Metro M.D.   On: 03/04/2018 15:34     Assessment/Plan: S/P Procedure(s) (LRB): CORONARY ARTERY BYPASS GRAFTING (CABG) x 3: -FREE LIMA to LAD, -SVG to DIAGONAL, -SVG to OM; ENDOSCOPIC HARVEST GREATER SAPHENOUS VEIN: -Right Thigh   (N/A) TRANSESOPHAGEAL ECHOCARDIOGRAM (TEE) (N/A)  1. CV- NSR, BP is elevated into the 130s-160s- will start Lopressor today, wean Milrinone as hemodynamics allowed 2. Pulm- CXR looks good, no significant effusions, CT output has been 500 since surgery, has been dangled twice- will d/c chest tube 3. Renal- creatinine WNL, weight is minimally elevated, will  hold off on Lasix for now 4. Expected post operative blood loss anemia, mild at 8.6, will start iron supplementation and follow 5. Expected post operative thrombocytopenia, mild at 129 will follow 6. DM- sugars controlled, start low dose levemir, wean off insulin drip 7. Dispo- patient stable, EKG shows NSR, start low dose Lopressor to help with HTN, POD #1 progression orders   Lowella Dandyrin Barrett 03/05/2018 7:59 AM   Progressing after CABG Agree with above assessmernt and plan patient examined and medical record reviewed,agree with above note. Kathlee Nationseter Van Trigt III 03/05/2018

## 2018-03-06 ENCOUNTER — Inpatient Hospital Stay (HOSPITAL_COMMUNITY): Payer: BLUE CROSS/BLUE SHIELD

## 2018-03-06 LAB — CBC
HCT: 28.1 % — ABNORMAL LOW (ref 39.0–52.0)
HEMOGLOBIN: 9.2 g/dL — AB (ref 13.0–17.0)
MCH: 28.8 pg (ref 26.0–34.0)
MCHC: 32.7 g/dL (ref 30.0–36.0)
MCV: 88.1 fL (ref 78.0–100.0)
PLATELETS: 149 10*3/uL — AB (ref 150–400)
RBC: 3.19 MIL/uL — AB (ref 4.22–5.81)
RDW: 13.5 % (ref 11.5–15.5)
WBC: 10.9 10*3/uL — AB (ref 4.0–10.5)

## 2018-03-06 LAB — BASIC METABOLIC PANEL
ANION GAP: 7 (ref 5–15)
BUN: 7 mg/dL — ABNORMAL LOW (ref 8–23)
CALCIUM: 8.2 mg/dL — AB (ref 8.9–10.3)
CO2: 26 mmol/L (ref 22–32)
CREATININE: 0.78 mg/dL (ref 0.61–1.24)
Chloride: 104 mmol/L (ref 98–111)
Glucose, Bld: 135 mg/dL — ABNORMAL HIGH (ref 70–99)
Potassium: 3.6 mmol/L (ref 3.5–5.1)
SODIUM: 137 mmol/L (ref 135–145)

## 2018-03-06 LAB — GLUCOSE, CAPILLARY
GLUCOSE-CAPILLARY: 117 mg/dL — AB (ref 70–99)
GLUCOSE-CAPILLARY: 131 mg/dL — AB (ref 70–99)
GLUCOSE-CAPILLARY: 154 mg/dL — AB (ref 70–99)
Glucose-Capillary: 150 mg/dL — ABNORMAL HIGH (ref 70–99)
Glucose-Capillary: 142 mg/dL — ABNORMAL HIGH (ref 70–99)
Glucose-Capillary: 214 mg/dL — ABNORMAL HIGH (ref 70–99)

## 2018-03-06 LAB — COOXEMETRY PANEL
Carboxyhemoglobin: 1.1 % (ref 0.5–1.5)
Methemoglobin: 1.4 % (ref 0.0–1.5)
O2 Saturation: 65 %
Total hemoglobin: 12.1 g/dL (ref 12.0–16.0)

## 2018-03-06 MED ORDER — INSULIN ASPART 100 UNIT/ML ~~LOC~~ SOLN
0.0000 [IU] | Freq: Three times a day (TID) | SUBCUTANEOUS | Status: DC
Start: 2018-03-06 — End: 2018-03-09
  Administered 2018-03-06 (×2): 2 [IU] via SUBCUTANEOUS
  Administered 2018-03-06 – 2018-03-07 (×2): 8 [IU] via SUBCUTANEOUS
  Administered 2018-03-07: 2 [IU] via SUBCUTANEOUS
  Administered 2018-03-07: 4 [IU] via SUBCUTANEOUS
  Administered 2018-03-07: 2 [IU] via SUBCUTANEOUS
  Administered 2018-03-08: 8 [IU] via SUBCUTANEOUS
  Administered 2018-03-08: 4 [IU] via SUBCUTANEOUS
  Administered 2018-03-08 – 2018-03-09 (×3): 2 [IU] via SUBCUTANEOUS

## 2018-03-06 MED ORDER — MOVING RIGHT ALONG BOOK
Freq: Once | Status: AC
  Administered 2018-03-06: 09:00:00
  Filled 2018-03-06: qty 1

## 2018-03-06 MED ORDER — SODIUM CHLORIDE 0.9% FLUSH
3.0000 mL | INTRAVENOUS | Status: DC | PRN
  Administered 2018-03-08: 3 mL via INTRAVENOUS
  Filled 2018-03-06: qty 3

## 2018-03-06 MED ORDER — LISINOPRIL 5 MG PO TABS
5.0000 mg | ORAL_TABLET | Freq: Every day | ORAL | Status: DC
  Administered 2018-03-06 – 2018-03-07 (×2): 5 mg via ORAL
  Filled 2018-03-06 (×2): qty 1

## 2018-03-06 MED ORDER — SODIUM CHLORIDE 0.9 % IV SOLN: 250.0000 mL | INTRAVENOUS | Status: DC | PRN

## 2018-03-06 MED ORDER — POTASSIUM CHLORIDE 10 MEQ/50ML IV SOLN
10.0000 meq | INTRAVENOUS | Status: AC
  Administered 2018-03-06 (×3): 10 meq via INTRAVENOUS
  Filled 2018-03-06 (×3): qty 50

## 2018-03-06 MED ORDER — OXYCODONE HCL 5 MG PO TABS: 5.0000 mg | ORAL_TABLET | ORAL | Status: DC | PRN

## 2018-03-06 MED ORDER — POTASSIUM CHLORIDE 10 MEQ/50ML IV SOLN: 10.0000 meq | INTRAVENOUS | Status: DC | PRN

## 2018-03-06 MED ORDER — FUROSEMIDE 20 MG PO TABS
20.0000 mg | ORAL_TABLET | Freq: Every day | ORAL | Status: DC
  Administered 2018-03-06: 20 mg via ORAL
  Filled 2018-03-06: qty 1

## 2018-03-06 MED ORDER — MAGNESIUM HYDROXIDE 400 MG/5ML PO SUSP
30.0000 mL | Freq: Every day | ORAL | Status: DC | PRN
  Administered 2018-03-07: 30 mL via ORAL
  Filled 2018-03-06: qty 30

## 2018-03-06 MED ORDER — SODIUM CHLORIDE 0.9% FLUSH
3.0000 mL | Freq: Two times a day (BID) | INTRAVENOUS | Status: DC
  Administered 2018-03-06 – 2018-03-08 (×4): 3 mL via INTRAVENOUS

## 2018-03-06 MED ORDER — POTASSIUM CHLORIDE CRYS ER 20 MEQ PO TBCR
20.0000 meq | EXTENDED_RELEASE_TABLET | Freq: Every day | ORAL | Status: DC
  Administered 2018-03-06: 20 meq via ORAL
  Filled 2018-03-06: qty 1

## 2018-03-06 MED ORDER — METOPROLOL TARTRATE 25 MG PO TABS
25.0000 mg | ORAL_TABLET | Freq: Two times a day (BID) | ORAL | Status: DC
  Administered 2018-03-06 – 2018-03-08 (×6): 25 mg via ORAL
  Filled 2018-03-06 (×7): qty 1

## 2018-03-06 MED ORDER — PROMETHAZINE HCL 25 MG/ML IJ SOLN
12.5000 mg | Freq: Four times a day (QID) | INTRAMUSCULAR | Status: DC | PRN
  Administered 2018-03-06: 12.5 mg via INTRAVENOUS
  Filled 2018-03-06: qty 1

## 2018-03-06 MED FILL — Albumin, Human Inj 5%: INTRAVENOUS | Qty: 250 | Status: AC

## 2018-03-06 MED FILL — Lidocaine HCl(Cardiac) IV PF Soln Pref Syr 100 MG/5ML (2%): INTRAVENOUS | Qty: 5 | Status: AC

## 2018-03-06 MED FILL — Mannitol IV Soln 20%: INTRAVENOUS | Qty: 500 | Status: AC

## 2018-03-06 MED FILL — Sodium Chloride IV Soln 0.9%: INTRAVENOUS | Qty: 2000 | Status: AC

## 2018-03-06 MED FILL — Heparin Sodium (Porcine) Inj 1000 Unit/ML: INTRAMUSCULAR | Qty: 10 | Status: AC

## 2018-03-06 MED FILL — Electrolyte-R (PH 7.4) Solution: INTRAVENOUS | Qty: 5000 | Status: AC

## 2018-03-06 MED FILL — Sodium Bicarbonate IV Soln 8.4%: INTRAVENOUS | Qty: 50 | Status: AC

## 2018-03-06 NOTE — Progress Notes (Addendum)
TCTS DAILY ICU PROGRESS NOTE                   [address]            [place] [address]          [phone]   2 Days Post-Op Procedure(s) (LRB): CORONARY ARTERY BYPASS GRAFTING (CABG) x 3: -FREE LIMA to LAD, -SVG to DIAGONAL, -SVG to OM; ENDOSCOPIC HARVEST GREATER SAPHENOUS VEIN: -Right Thigh   (N/A) TRANSESOPHAGEAL ECHOCARDIOGRAM (TEE) (N/A)  Total Length of Stay:  LOS: 5 days   Subjective:  Patient complaining of nausea this morning.  Started yesterday and has been persistent.  He is not getting much relief with medications.  Objective: Vital signs in last 24 hours: Temp:  [98.1 F (36.7 C)-99 F (37.2 C)] 98.8 F (37.1 C) (07/10 0725) Pulse Rate:  [73-93] 83 (07/10 0600) Cardiac Rhythm: Normal sinus rhythm (07/09 2000) Resp:  [19-39] 29 (07/10 0600) BP: (117-163)/(61-106) 148/76 (07/10 0600) SpO2:  [91 %-97 %] 94 % (07/10 0600) Arterial Line BP: (125-131)/(47-48) 126/47 (07/09 0818) Weight:  [145 lb 8.1 oz (66 kg)] 145 lb 8.1 oz (66 kg) (07/10 0500)  Filed Weights   03/04/18 0444 03/05/18 0500 03/06/18 0500  Weight: 136 lb 4.8 oz (61.8 kg) 149 lb 11.1 oz (67.9 kg) 145 lb 8.1 oz (66 kg)    Weight change: -4 lb 3 oz (-1.9 kg)   Hemodynamic parameters for last 24 hours: PAP: (31-38)/(11-13) 36/11 CO:  [5 L/min] 5 L/min CI:  [3.1 L/min/m2] 3.1 L/min/m2  Intake/Output from previous day: 07/09 0701 - 07/10 0700 In: 800 [P.O.:60; I.V.:579.7; IV Piggyback:160.3] Out: 3236 [Urine:3045; Emesis/NG output:1; Chest Tube:190]  Current Meds: Scheduled Meds: . acetaminophen  1,000 mg Oral Q6H   Or  . acetaminophen (TYLENOL) oral liquid 160 mg/5 mL  1,000 mg Per Tube Q6H  . aspirin EC  325 mg Oral Daily   Or  . aspirin  324 mg Per Tube Daily  . atorvastatin  80 mg Oral q1800  . bisacodyl  10 mg Oral Daily   Or  . bisacodyl  10 mg Rectal Daily  . Chlorhexidine Gluconate Cloth  6 each Topical Daily  . docusate sodium  200 mg Oral Daily  . enoxaparin  (LOVENOX) injection  40 mg Subcutaneous QHS  . ferrous sulfate  325 mg Oral TID WC  . finasteride  5 mg Oral Daily  . furosemide  20 mg Intravenous Daily  . insulin aspart  0-24 Units Subcutaneous TID AC & HS  . insulin detemir  20 Units Subcutaneous Daily  . insulin regular  0-10 Units Intravenous TID WC  . levalbuterol  0.63 mg Nebulization TID  . lisinopril  5 mg Oral Daily  . mouth rinse  15 mL Mouth Rinse BID  . metoCLOPramide (REGLAN) injection  10 mg Intravenous Q6H  . metoprolol tartrate  25 mg Oral BID  . mometasone-formoterol  2 puff Inhalation BID  . moving right along book   Does not apply Once  . mupirocin ointment   Nasal BID  . pantoprazole  40 mg Oral Daily  . sodium chloride flush  3 mL Intravenous Q12H  . sodium chloride flush  3 mL Intravenous Q12H   Continuous Infusions: . sodium chloride Stopped (03/05/18 0815)  . sodium chloride    . sodium chloride 10 mL/hr at 03/04/18 1451  . sodium chloride Stopped (03/05/18 0815)  . sodium chloride    . lactated ringers  Stopped (03/04/18 1455)  . lactated ringers 20 mL/hr at 03/06/18 0600  . potassium chloride 10 mEq (03/06/18 0650)   PRN Meds:.sodium chloride, sodium chloride, labetalol, magnesium hydroxide, midazolam, morphine injection, ondansetron (ZOFRAN) IV, oxyCODONE, promethazine, sodium chloride flush, sodium chloride flush, sodium chloride flush, traMADol  General appearance: alert, cooperative and no distress Heart: regular rate and rhythm Lungs: clear to auscultation bilaterally Abdomen: soft, non-tender; bowel sounds normal; no masses,  no organomegaly Extremities: edema trace Wound: aquacel on sternum, EVH site clean and dry  Lab Results: CBC: Recent Labs    03/05/18 1738 03/05/18 1744 03/06/18 0500  WBC 9.5  --  10.9*  HGB 9.0* 9.2* 9.2*  HCT 28.1* 27.0* 28.1*  PLT 140*  --  149*   BMET:  Recent Labs    03/05/18 0355  03/05/18 1744 03/06/18 0500  NA 140  --  136 137  K 4.0  --  3.9 3.6    CL 111  --  100 104  CO2 24  --   --  26  GLUCOSE 117*  --  183* 135*  BUN 7*  --  7* 7*  CREATININE 0.79   < > 0.70 0.78  CALCIUM 7.9*  --   --  8.2*   < > = values in this interval not displayed.    CMET: Lab Results  Component Value Date   WBC 10.9 (H) 03/06/2018   HGB 9.2 (L) 03/06/2018   HCT 28.1 (L) 03/06/2018   PLT 149 (L) 03/06/2018   GLUCOSE 135 (H) 03/06/2018   CHOL 200 02/28/2018   TRIG 273 (H) 02/28/2018   HDL 33 (L) 02/28/2018   LDLCALC 112 (H) 02/28/2018   ALT 20 03/02/2018   AST 7 (L) 03/02/2018   NA 137 03/06/2018   K 3.6 03/06/2018   CL 104 03/06/2018   CREATININE 0.78 03/06/2018   BUN 7 (L) 03/06/2018   CO2 26 03/06/2018   TSH 1.150 03/02/2018   INR 1.39 03/04/2018   HGBA1C 7.6 (H) 03/02/2018      PT/INR:  Recent Labs    03/04/18 1431  LABPROT 16.9*  INR 1.39   Radiology: No results found.   Assessment/Plan: S/P Procedure(s) (LRB): CORONARY ARTERY BYPASS GRAFTING (CABG) x 3: -FREE LIMA to LAD, -SVG to DIAGONAL, -SVG to OM; ENDOSCOPIC HARVEST GREATER SAPHENOUS VEIN: -Right Thigh   (N/A) TRANSESOPHAGEAL ECHOCARDIOGRAM (TEE) (N/A)  1. CV- NSR, + HTN- will increase Lopressor and Lisinopril for better control- remains on milrinone can possibly d/c today 2. Pulm- no acute issues, wean oxygen as tolerated, CXR without pneumothorax, but does have residual atelectasis, continue IS 3. Renal- creatinine WNL, weight is stable, transition to oral Lasix 4. Expected post operative blood loss anemia, mild will stop iron with GI upset 5. DM-sugars are controlled, continue current regimen 6. GI- nausea continue Reglan, Zofran, will add prn phenergan 7. Dispo- patient stable, phenergan for nausea relief, transition to oral Lasix, transfer 4E     Lowella Dandyrin Barrett 03/06/2018 7:59 AM   Nausea prob med related Will tx to stepdown patient examined and medical record reviewed,agree with above note. Kathlee Nationseter Van Trigt III 03/06/2018

## 2018-03-06 NOTE — Progress Notes (Signed)
Stopped in and visited with patient and daughter who is at bedside this morning.  They are thankful for patient feeling better except nausea.  No spiritual needs at this time however thankful that I stopped by.    03/06/18 [id number]  Clinical Encounter Type  Visited With Patient and family together  Visit Type Initial;Spiritual support

## 2018-03-06 NOTE — Progress Notes (Signed)
CARDIAC REHAB PHASE I   PRE:  Rate/Rhythm: 90 SR  BP:  Sitting: 156/88      SaO2: 94 RA  MODE:  Ambulation: 400 ft   POST:  Rate/Rhythm: 97 SR  BP:  Sitting: 137/84    SaO2: 95 SR   Pt ambulated 400ft in hallway stand92by assist with front wheel walker. Reminded frequently by dght to slow down. Upon return to room pt c/o chills. Temp 98.5 orally. RN made aware. Encouraged pt to continue IS use and walk a third time tonight. Will continue to follow.  [phone] Reynold Boweneresa  Jya Hughston, RN BSN 03/06/2018 2:41 PM

## 2018-03-06 NOTE — Progress Notes (Signed)
Patient received in stable condition from 2H. Report received from [place] and patient, daughter and son were oriented to room and unit. Pacing wires intact and taped and telemetry set up and CCMD notified.

## 2018-03-07 ENCOUNTER — Inpatient Hospital Stay (HOSPITAL_COMMUNITY): Payer: BLUE CROSS/BLUE SHIELD

## 2018-03-07 LAB — TYPE AND SCREEN
ABO/RH(D): O POS
Antibody Screen: NEGATIVE
Unit division: 0
Unit division: 0

## 2018-03-07 LAB — GLUCOSE, CAPILLARY
GLUCOSE-CAPILLARY: 142 mg/dL — AB (ref 70–99)
GLUCOSE-CAPILLARY: 230 mg/dL — AB (ref 70–99)
Glucose-Capillary: 172 mg/dL — ABNORMAL HIGH (ref 70–99)
Glucose-Capillary: 150 mg/dL — ABNORMAL HIGH (ref 70–99)
Glucose-Capillary: 179 mg/dL — ABNORMAL HIGH (ref 70–99)

## 2018-03-07 LAB — BPAM RBC
Blood Product Expiration Date: 201908102359
Blood Product Expiration Date: 201908102359
Unit Type and Rh: 5100
Unit Type and Rh: 5100

## 2018-03-07 LAB — CBC
HCT: 29.5 % — ABNORMAL LOW (ref 39.0–52.0)
Hemoglobin: 9.7 g/dL — ABNORMAL LOW (ref 13.0–17.0)
MCH: 28.4 pg (ref 26.0–34.0)
MCHC: 32.9 g/dL (ref 30.0–36.0)
MCV: 86.3 fL (ref 78.0–100.0)
Platelets: 183 10*3/uL (ref 150–400)
RBC: 3.42 MIL/uL — ABNORMAL LOW (ref 4.22–5.81)
RDW: 13.3 % (ref 11.5–15.5)
WBC: 9.9 10*3/uL (ref 4.0–10.5)

## 2018-03-07 LAB — BASIC METABOLIC PANEL
Anion gap: 9 (ref 5–15)
BUN: 10 mg/dL (ref 8–23)
CO2: 27 mmol/L (ref 22–32)
Calcium: 8.2 mg/dL — ABNORMAL LOW (ref 8.9–10.3)
Chloride: 102 mmol/L (ref 98–111)
Creatinine, Ser: 0.89 mg/dL (ref 0.61–1.24)
GFR calc Af Amer: >60 mL/min (ref >60)
GFR calc non Af Amer: >60 mL/min (ref >60)
Glucose, Bld: 171 mg/dL — ABNORMAL HIGH (ref 70–99)
Potassium: 3.7 mmol/L (ref 3.5–5.1)
Sodium: 138 mmol/L (ref 135–145)

## 2018-03-07 MED ORDER — METFORMIN HCL 500 MG PO TABS
500.0000 mg | ORAL_TABLET | Freq: Two times a day (BID) | ORAL | Status: DC
  Administered 2018-03-07 – 2018-03-09 (×5): 500 mg via ORAL
  Filled 2018-03-07 (×5): qty 1

## 2018-03-07 NOTE — Evaluation (Signed)
Physical Therapy Evaluation Patient Details Name: Nicholas [name] MRN: [id number] DOB: 1955-10-07 Today's Date: 03/07/2018   History of Present Illness  Pt is a 62 y.o. M with signficant PMH of type 2 diabetes mellitus, hypertension, hyperlipidemia and subclavian artery stenosis who presents for evaluation of substernal chest pain. Now s/p Coronary Artery Bypass Grafting x 3. Recently found a small right sided pneumothorax on chest xray.  Clinical Impression  Pt admitted with above diagnosis. Pt currently with functional limitations due to the deficits listed below (see PT Problem List). On PT evaluation, patient ambulating 400 feet with no assistive device without difficulty. HR 98-107 bpm. Reviewed sternal precautions and instructed patient on proper bed mobility technique; patient and patient son verbalized understanding. Pt will benefit from skilled PT to increase their independence and safety with mobility to allow discharge to the venue listed below.       Follow Up Recommendations Outpatient PT;Other (comment)((cardiac rehab))    Equipment Recommendations  3in1 (PT)    Recommendations for Other Services       Precautions / Restrictions Precautions Precautions: Fall;Sternal Precaution Booklet Issued: Yes (comment) Precaution Comments: verbally reviewed precautions and provided written handout Restrictions Weight Bearing Restrictions: (sternal precautions)      Mobility  Bed Mobility Overal bed mobility: Needs Assistance Bed Mobility: Sidelying to Sit;Sit to Sidelying;Rolling Rolling: Independent Sidelying to sit: Min assist     Sit to sidelying: Modified independent (Device/Increase time) General bed mobility comments: min assist to elevate trunk in order to avoid excessive pushing with BUE's  Transfers Overall transfer level: Needs assistance Equipment used: None Transfers: Sit to/from Stand Sit to Stand: Supervision         General transfer comment: supervision  for safety  Ambulation/Gait Ambulation/Gait assistance: Modified independent (Device/Increase time) Gait Distance (Feet): 400 Feet Assistive device: None Gait Pattern/deviations: WFL(Within Functional Limits)     General Gait Details: Patient with good gait speed and posture. HR 98-107 bpm  Stairs            Wheelchair Mobility    Modified Rankin (Stroke Patients Only)       Balance Overall balance assessment: No apparent balance deficits (not formally assessed)                                           Pertinent Vitals/Pain Pain Assessment: No/denies pain    Home Living Family/patient expects to be discharged to:: Private residence Living Arrangements: Spouse/significant other Available Help at Discharge: Family Type of Home: House Home Access: Stairs to enter Entrance Stairs-Rails: None Secretary/administratorntrance Stairs-Number of Steps: 1 Home Layout: One level Home Equipment: None      Prior Function Level of Independence: Independent               Hand Dominance        Extremity/Trunk Assessment   Upper Extremity Assessment Upper Extremity Assessment: Overall WFL for tasks assessed    Lower Extremity Assessment Lower Extremity Assessment: RLE deficits/detail;LLE deficits/detail RLE Deficits / Details: 5/5 LLE Deficits / Details: 5/5    Cervical / Trunk Assessment Cervical / Trunk Assessment: Normal  Communication   Communication: No difficulties(Patient son-in-law clarified if needed)  Cognition Arousal/Alertness: Awake/alert Behavior During Therapy: WFL for tasks assessed/performed Overall Cognitive Status: Within Functional Limits for tasks assessed  General Comments      Exercises     Assessment/Plan    PT Assessment Patient needs continued PT services  PT Problem List Decreased activity tolerance;Decreased balance;Decreased mobility       PT Treatment  Interventions Gait training;Functional mobility training;Stair training;Therapeutic activities;Therapeutic exercise;Balance training;Patient/family education    PT Goals (Current goals can be found in the Care Plan section)  Acute Rehab PT Goals Patient Stated Goal: be independent PT Goal Formulation: With patient Time For Goal Achievement: 03/21/18 Potential to Achieve Goals: Good    Frequency Min 3X/week   Barriers to discharge        Co-evaluation               AM-PAC PT "6 Clicks" Daily Activity  Outcome Measure Difficulty turning over in bed (including adjusting bedclothes, sheets and blankets)?: None Difficulty moving from lying on back to sitting on the side of the bed? : Unable Difficulty sitting down on and standing up from a chair with arms (e.g., wheelchair, bedside commode, etc,.)?: None Help needed moving to and from a bed to chair (including a wheelchair)?: None Help needed walking in hospital room?: None Help needed climbing 3-5 steps with a railing? : A Little 6 Click Score: 20    End of Session Equipment Utilized During Treatment: Gait belt Activity Tolerance: Patient tolerated treatment well Patient left: in chair;with call bell/phone within reach;with family/visitor present   PT Visit Diagnosis: Unsteadiness on feet (R26.81);Other abnormalities of gait and mobility (R26.89)    Time: 1004-1020 PT Time Calculation (min) (ACUTE ONLY): 16 min   Charges:   PT Evaluation $PT Eval Moderate Complexity: 1 Mod     PT G Codes:        Laurina Bustle, PT, DPT Acute Rehabilitation Services  Pager: [phone]   Vanetta Mulders 03/07/2018, 11:01 AM

## 2018-03-07 NOTE — Progress Notes (Signed)
Radiologist called this RN to notify MD that patient  have a small Right sideded pneumothorax on chest xray, Erin PA paged, Morton PetersVan Tright MD aware, no new orders received. Patient has not had a bowel movement since July 3rd, verbal order received for lactulose 20mg  PRN.

## 2018-03-07 NOTE — Progress Notes (Addendum)
      [address]       [place] [id number]             [phone]      3 Days Post-Op Procedure(s) (LRB): CORONARY ARTERY BYPASS GRAFTING (CABG) x 3: -FREE LIMA to LAD, -SVG to DIAGONAL, -SVG to OM; ENDOSCOPIC HARVEST GREATER SAPHENOUS VEIN: -Right Thigh   (N/A) TRANSESOPHAGEAL ECHOCARDIOGRAM (TEE) (N/A)   Subjective:  No new complaints.  Nausea has improved.  + ambulation   No BM  Objective: Vital signs in last 24 hours: Temp:  [98 F (36.7 C)-98.8 F (37.1 C)] 98.3 F (36.8 C) (07/11 0335) Pulse Rate:  [87-98] 87 (07/11 0335) Cardiac Rhythm: Normal sinus rhythm (07/11 0335) Resp:  [21-37] 24 (07/11 0335) BP: (128-157)/(72-87) 128/87 (07/11 0335) SpO2:  [92 %-98 %] 92 % (07/11 0335) Weight:  [139 lb 12.4 oz (63.4 kg)] 139 lb 12.4 oz (63.4 kg) (07/11 0335)  Intake/Output from previous day: 07/10 0701 - 07/11 0700 In: 571.2 [P.O.:480; I.V.:41.2; IV Piggyback:50] Out: 2200 [Urine:2200]  General appearance: alert, cooperative and no distress Heart: regular rate and rhythm and tachy Lungs: clear to auscultation bilaterally Abdomen: soft, non-tender; bowel sounds normal; no masses,  no organomegaly Extremities: edema none present Wound: clean and dry  Lab Results: Recent Labs    03/06/18 0500 03/07/18 0433  WBC 10.9* 9.9  HGB 9.2* 9.7*  HCT 28.1* 29.5*  PLT 149* 183   BMET:  Recent Labs    03/06/18 0500 03/07/18 0433  NA 137 138  K 3.6 3.7  CL 104 102  CO2 26 27  GLUCOSE 135* 171*  BUN 7* 10  CREATININE 0.78 0.89  CALCIUM 8.2* 8.2*    PT/INR:  Recent Labs    03/04/18 1431  LABPROT 16.9*  INR 1.39   ABG    Component Value Date/Time   PHART 7.389 03/05/2018 0644   HCO3 19.8 (L) 03/05/2018 0644   TCO2 22 03/05/2018 1744   ACIDBASEDEF 4.0 (H) 03/05/2018 0644   O2SAT 65.0 03/06/2018 0830   CBG (last 3)  Recent Labs    03/06/18 1644 03/06/18 2119 03/07/18 0632  GLUCAP 131* 117* 142*    Assessment/Plan: S/P Procedure(s)  (LRB): CORONARY ARTERY BYPASS GRAFTING (CABG) x 3: -FREE LIMA to LAD, -SVG to DIAGONAL, -SVG to OM; ENDOSCOPIC HARVEST GREATER SAPHENOUS VEIN: -Right Thigh   (N/A) TRANSESOPHAGEAL ECHOCARDIOGRAM (TEE) (N/A)  1. CV- Sinus Tach, BP remains slightly elevated- will continue Lasix, increase Lisinopril to 10 mg daily 2. Pulm- off oxygen, no acute issues, continue IS 3. Renal- creatinine WNL, weight is almost back to baseline, no edema on exam.. Stop lasix, potassium 4. GI-nausea resolved, lactulose prn 5. DM- sugars controlled, d/c levemir and restart home regimen of Metformin 6. Dispo- patient stable, tachycardic today likely due to diuretic use, will d/c, watch HR if remains stable, will d/c EPW, transition to home oral diabetic medications, lactulose for constipation, likely ready for d/c on Saturday if remains stable    LOS: 6 days    Nicholas Dandyrin [name] 03/07/2018  Patient progressing, nausea improved Chest x-ray this morning she was a small right apical pneumothorax, asymptomatic-repeat x-ray in a.m. Agree with above assessment and plan. Lovett SoxPeter Jaquelin Meaney MD

## 2018-03-07 NOTE — Progress Notes (Signed)
Patient ambulated three times on this shift with a stand by assist, ambulation well tolerated.

## 2018-03-07 NOTE — Progress Notes (Signed)
1345 Pt walked with son earlier and PT later. Now in bed resting. Will continue to follow. Luetta NuttingCharlene Scherrie Seneca RN BSN 03/07/2018 1:45 PM

## 2018-03-08 ENCOUNTER — Inpatient Hospital Stay (HOSPITAL_COMMUNITY): Payer: BLUE CROSS/BLUE SHIELD

## 2018-03-08 LAB — GLUCOSE, CAPILLARY
GLUCOSE-CAPILLARY: 234 mg/dL — AB (ref 70–99)
Glucose-Capillary: 139 mg/dL — ABNORMAL HIGH (ref 70–99)
Glucose-Capillary: 193 mg/dL — ABNORMAL HIGH (ref 70–99)
Glucose-Capillary: 135 mg/dL — ABNORMAL HIGH (ref 70–99)
Glucose-Capillary: 140 mg/dL — ABNORMAL HIGH (ref 70–99)

## 2018-03-08 NOTE — Plan of Care (Signed)
  Problem: Education: Goal: Knowledge of General Education information will improve Outcome: Progressing   Problem: Health Behavior/Discharge Planning: Goal: Ability to manage health-related needs will improve Outcome: Progressing   Problem: Clinical Measurements: Goal: Ability to maintain clinical measurements within normal limits will improve Outcome: Progressing   

## 2018-03-08 NOTE — Care Management Note (Signed)
Case Management Note Donn PieriniKristi Lydian Chavous RN, BSN Unit 4E-Case Manager [phone]  Patient Details  Name: Nicholas BignessGurdev [name] MRN: [id number] Date of Birth: 1956/01/16  Subjective/Objective:   [place], s/p [place]                 Action/Plan: PTA pt lived at home with family- orders placed for HHRN/PT- spoke with pt and family at the bedside- [facility] - [facility]- [facility] (per Lupita Leashonna on contact with pt - pt reported [facility] and [facility])-  No DME needs noted-  Expected Discharge Date:                  Expected Discharge Plan:  Home w Home Health Services  In-House Referral:     Discharge planning Services  CM Consult  Post Acute Care Choice:  Home Health Choice offered to:  Patient, Adult Children  DME Arranged:    DME Agency:     HH Arranged:  RN, PT HH Agency:  [facility]  Status of Service:  Completed, signed off  If discussed at Long Length of Stay Meetings, dates discussed:    Additional Comments:  Darrold SpanWebster, Thomson Herbers [place], RN 03/08/2018, 5:18 PM

## 2018-03-08 NOTE — Progress Notes (Signed)
Mr. Nicholas [name] is doing well post CABG.  BP labile so lisinopril was discontinued.  He is on aspirin, high potency statin and beta blocker.  Cardiology does not have any additional input.  CARDIOLOGY RECOMMENDATIONS:  Discharge is anticipated in the next 48 hours. Recommendations for medications and follow up:  Discharge Medications: Continue medications [facility].  Follow Up: The patient's Primary Cardiologist is Dina RichBranch, Jonathan, MD  Follow up in the office with Corine ShelterLuke Kilroy, PA-C on 03/27/18 at 11 am  Signed,  Chilton Siiffany [place], MD  1:44 PM 03/08/2018  [facility]

## 2018-03-08 NOTE — Progress Notes (Signed)
Patient education completed fro removal of pacing wires , patient and his family verbalized understanding, pacing wires removed without difficulty, patient now on bedrest, will monitor.

## 2018-03-08 NOTE — Progress Notes (Signed)
Pt already walked this am and just to bed. Discussed ed with pt and daughter. Good reception. He sts he feels best when his CBG is closer to 150. [facility] to [facility].  [id number] Nicholas [name] CES, ACSM 9:42 AM 03/08/2018

## 2018-03-08 NOTE — Progress Notes (Signed)
Physical Therapy Treatment Patient Details Name: Nicholas [name] MRN: [id number] DOB: 10-01-1955 Today's Date: 03/08/2018    History of Present Illness Pt is a 62 y.o. M with signficant PMH of type 2 diabetes mellitus, hypertension, hyperlipidemia and subclavian artery stenosis who presents for evaluation of substernal chest pain. Now s/p Coronary Artery Bypass Grafting x 3. Recently found a small right sided pneumothorax on chest xray.    PT Comments    Patient progressed well with therapy, ambulating unit and stair well without AD or SOB/LOB. reviewed sternal precautions with patient and family. VSS with activity, agree with OP cardiac rehab recs. PT will sign off, all acute needs met, no concern for safe return home once medically stable.    Follow Up Recommendations  Outpatient PT;Other (comment)(OP CARDIAC REHAB)     Equipment Recommendations  None recommended by PT    Recommendations for Other Services       Precautions / Restrictions Precautions Precautions: Fall;Sternal Precaution Booklet Issued: Yes (comment) Precaution Comments: verbally reviewed precautions and provided written handout Restrictions Weight Bearing Restrictions: Yes    Mobility  Bed Mobility               General bed mobility comments: in chair at entry  Transfers Overall transfer level: Modified independent Equipment used: None                Ambulation/Gait Ambulation/Gait assistance: Modified independent (Device/Increase time) Gait Distance (Feet): 700 Feet Assistive device: None Gait Pattern/deviations: WFL(Within Functional Limits) Gait velocity: normal   General Gait Details: pt ambulating on RA, no AD, no LOB, normal speed, DGI perfect score.    Stairs Stairs: Yes Stairs assistance: Modified independent (Device/Increase time) Stair Management: One rail Left Number of Stairs: 24 General stair comments: flight, alternating, no safety issues.   Wheelchair Mobility     Modified Rankin (Stroke Patients Only)       Balance Overall balance assessment: No apparent balance deficits (not formally assessed)                               Standardized Balance Assessment Standardized Balance Assessment : Dynamic Gait Index   Dynamic Gait Index Level Surface: Normal Change in Gait Speed: Normal Gait with Horizontal Head Turns: Normal Gait with Vertical Head Turns: Normal Gait and Pivot Turn: Normal Step Over Obstacle: Normal Step Around Obstacles: Normal Steps: Normal Total Score: 24      Cognition Arousal/Alertness: Awake/alert Behavior During Therapy: WFL for tasks assessed/performed Overall Cognitive Status: Within Functional Limits for tasks assessed                                        Exercises      General Comments        Pertinent Vitals/Pain Pain Assessment: No/denies pain    Home Living                      Prior Function            PT Goals (current goals can now be found in the care plan section) Acute Rehab PT Goals Patient Stated Goal: be independent PT Goal Formulation: With patient Time For Goal Achievement: 03/21/18 Potential to Achieve Goals: Good    Frequency           PT Plan Current plan remains  appropriate    Co-evaluation              AM-PAC PT "6 Clicks" Daily Activity  Outcome Measure  Difficulty turning over in bed (including adjusting bedclothes, sheets and blankets)?: None Difficulty moving from lying on back to sitting on the side of the bed? : None Difficulty sitting down on and standing up from a chair with arms (e.g., wheelchair, bedside commode, etc,.)?: None Help needed moving to and from a bed to chair (including a wheelchair)?: None Help needed walking in hospital room?: None Help needed climbing 3-5 steps with a railing? : None 6 Click Score: 24    End of Session Equipment Utilized During Treatment: Gait belt Activity Tolerance:  Patient tolerated treatment well Patient left: in chair;with call bell/phone within reach;with family/visitor present Nurse Communication: Mobility status PT Visit Diagnosis: Unsteadiness on feet (R26.81);Other abnormalities of gait and mobility (R26.89)     Time: 1550-1610 PT Time Calculation (min) (ACUTE ONLY): 20 min  Charges:  $Gait Training: 8-22 mins                    G Codes:      Reinaldo Berber, PT, DPT Acute Rehab Services Pager: [phone]     Reinaldo Berber 03/08/2018, 4:01 PM

## 2018-03-08 NOTE — Progress Notes (Addendum)
      [address]       [place] [id number]             [phone]        4 Days Post-Op Procedure(s) (LRB): CORONARY ARTERY BYPASS GRAFTING (CABG) x 3: -FREE LIMA to LAD, -SVG to DIAGONAL, -SVG to OM; ENDOSCOPIC HARVEST GREATER SAPHENOUS VEIN: -Right Thigh   (N/A) TRANSESOPHAGEAL ECHOCARDIOGRAM (TEE) (N/A)  Subjective: Patient without complaints this am.  Objective: Vital signs in last 24 hours: Temp:  [98.2 F (36.8 C)-100 F (37.8 C)] 98.8 F (37.1 C) (07/12 0351) Pulse Rate:  [88-100] 90 (07/12 0351) Cardiac Rhythm: Normal sinus rhythm (07/12 0351) Resp:  [22-35] 22 (07/12 0351) BP: (90-129)/(64-83) 90/64 (07/12 0351) SpO2:  [91 %-99 %] 93 % (07/12 0351) Weight:  [139 lb 8.8 oz (63.3 kg)] 139 lb 8.8 oz (63.3 kg) (07/12 0351)  Pre op weight 61.8 kg Current Weight  03/08/18 139 lb 8.8 oz (63.3 kg)       Intake/Output from previous day: 07/11 0701 - 07/12 0700 In: 480 [P.O.:480] Out: 250 [Urine:250]   Physical Exam:  Cardiovascular: RRR Pulmonary: Clear to auscultation bilaterally Abdomen: Soft, non tender, bowel sounds present. Extremities: No lower extremity edema. Wounds: Clean and dry.  No erythema or signs of infection.  Lab Results: CBC: Recent Labs    03/06/18 0500 03/07/18 0433  WBC 10.9* 9.9  HGB 9.2* 9.7*  HCT 28.1* 29.5*  PLT 149* 183   BMET:  Recent Labs    03/06/18 0500 03/07/18 0433  NA 137 138  K 3.6 3.7  CL 104 102  CO2 26 27  GLUCOSE 135* 171*  BUN 7* 10  CREATININE 0.78 0.89  CALCIUM 8.2* 8.2*    PT/INR:  Lab Results  Component Value Date   INR 1.39 03/04/2018   INR 1.01 03/02/2018   INR 1.00 02/28/2018   ABG:  INR: Will add last result for INR, ABG once components are confirmed Will add last 4 CBG results once components are confirmed  Assessment/Plan:  1. CV - SR, PVCs. On Lopressor 25 mg bid and Lisinopril 5 mg daily. SBP labile this am so will stop Lisinopril 2.  Pulmonary - On room  air. Encourage incentive spirometer. 3.  Acute blood loss anemia - Last H and H 9.7 and 29.5 4. DM-CBGs 150/179/139. On Metformin 500 mg bid. Pre op HGA1C 7.6. 5. Remove EPW 6. Hopefully, home in am   Lelon HuhDonielle M [facility] NorthZimmermanPA-C 03/08/2018,7:09 AM [phone]  Doing well CXR pending to f/u small R apical pntx 5 % patient examined and medical record reviewed,agree with above note. Kathlee Nationseter Van Trigt III 03/08/2018

## 2018-03-09 LAB — GLUCOSE, CAPILLARY: GLUCOSE-CAPILLARY: 153 mg/dL — AB (ref 70–99)

## 2018-03-09 MED ORDER — ATORVASTATIN CALCIUM 80 MG PO TABS: 80.0000 mg | ORAL_TABLET | Freq: Every day | ORAL | 1 refills | Status: DC

## 2018-03-09 MED ORDER — ASPIRIN 325 MG PO TBEC: 325.0000 mg | DELAYED_RELEASE_TABLET | Freq: Every day | ORAL | 0 refills | Status: DC

## 2018-03-09 MED ORDER — ACETAMINOPHEN 500 MG PO TABS: 1000.0000 mg | ORAL_TABLET | Freq: Four times a day (QID) | ORAL | 0 refills | Status: DC

## 2018-03-09 MED ORDER — METOPROLOL TARTRATE 25 MG PO TABS: 25.0000 mg | ORAL_TABLET | Freq: Two times a day (BID) | ORAL | 1 refills | Status: DC

## 2018-03-09 MED ORDER — OXYCODONE HCL 5 MG PO TABS: 5.0000 mg | ORAL_TABLET | Freq: Four times a day (QID) | ORAL | 0 refills | Status: DC | PRN

## 2018-03-09 NOTE — Progress Notes (Signed)
Patient with discharge orders to home. Chest sutures removed without difficulty. Telemetry along with PIV x2 removed with catheters intact.  Discharge instructions along with prescriptions x3 given with verbal understanding received from both patient and daughter. Personal belongings packed by the family. Patient to lobby via w/c. Both appreciative of care.

## 2018-03-09 NOTE — Progress Notes (Signed)
      [address]       [place] [id number]             [phone]      5 Days Post-Op Procedure(s) (LRB): CORONARY ARTERY BYPASS GRAFTING (CABG) x 3: -FREE LIMA to LAD, -SVG to DIAGONAL, -SVG to OM; ENDOSCOPIC HARVEST GREATER SAPHENOUS VEIN: -Right Thigh   (N/A) TRANSESOPHAGEAL ECHOCARDIOGRAM (TEE) (N/A) Subjective: No issues overnight. He is ready for home.  Objective: Vital signs in last 24 hours: Temp:  [97.6 F (36.4 C)-98.9 F (37.2 C)] 97.6 F (36.4 C) (07/13 0416) Pulse Rate:  [86-98] 86 (07/13 0416) Cardiac Rhythm: Normal sinus rhythm (07/13 0700) Resp:  [18-32] 23 (07/13 0416) BP: (95-127)/(63-78) 127/76 (07/13 0416) SpO2:  [92 %-98 %] 95 % (07/13 0416) Weight:  [64.5 kg (142 lb 3.2 oz)] 64.5 kg (142 lb 3.2 oz) (07/13 0416)     Intake/Output from previous day: 07/12 0701 - 07/13 0700 In: 480 [P.O.:480] Out: 260 [Urine:260] Intake/Output this shift: No intake/output data recorded.  General appearance: alert, cooperative and no distress Heart: sinus tachycardia Lungs: clear to auscultation bilaterally Abdomen: soft, non-tender; bowel sounds normal; no masses,  no organomegaly Extremities: extremities normal, atraumatic, no cyanosis or edema Wound: clean and dry  Lab Results: Recent Labs    03/07/18 0433  WBC 9.9  HGB 9.7*  HCT 29.5*  PLT 183   BMET:  Recent Labs    03/07/18 0433  NA 138  K 3.7  CL 102  CO2 27  GLUCOSE 171*  BUN 10  CREATININE 0.89  CALCIUM 8.2*    PT/INR: No results for input(s): LABPROT, INR in the last 72 hours. ABG    Component Value Date/Time   PHART 7.389 03/05/2018 0644   HCO3 19.8 (L) 03/05/2018 0644   TCO2 22 03/05/2018 1744   ACIDBASEDEF 4.0 (H) 03/05/2018 0644   O2SAT 65.0 03/06/2018 0830   CBG (last 3)  Recent Labs    03/08/18 1642 03/08/18 2029 03/09/18 0556  GLUCAP 135* 140* 153*    Assessment/Plan: S/P Procedure(s) (LRB): CORONARY ARTERY BYPASS GRAFTING (CABG) x 3: -FREE LIMA  to LAD, -SVG to DIAGONAL, -SVG to OM; ENDOSCOPIC HARVEST GREATER SAPHENOUS VEIN: -Right Thigh   (N/A) TRANSESOPHAGEAL ECHOCARDIOGRAM (TEE) (N/A)  1. CV- NSR with PVCs to ST in the low 100s. BP better this morning on just lopressor.  2. Pulm-tolerating room air with good oxygenation. 2-view CXR yesterday showed minimal right apical pneumothorax. Atelectasis and small bilateral pleural effusions. 3. Renal-creatinine 0.89, electrolytes okay. Remains a few lbs over baseline weight.  4. H and H is stable 5. Platelets are trending up 6. Blood glucose well controlled. On Metformin. Will need outpatient follow-up  Plan: Home today with family.    LOS: 8 days    Sharlene Doryessa N Tullio Chausse 03/09/2018

## 2018-03-09 NOTE — Progress Notes (Signed)
Pt walked halls independently this AM w/ family member, tolerated well

## 2018-03-11 ENCOUNTER — Telehealth: Payer: Self-pay

## 2018-03-11 NOTE — Telephone Encounter (Signed)
Mr. Doristine ChurchSingh's daughter, Rhea BleacherHarpreet called questioning if it was okay for Mr. Sookram to restart his vitamin regimen.  He is s/p CABG x3 03/04/18 with Dr. Donata ClayVan Trigt and has been home from the hospital for 2 days per family.  I advised the patient to take only the medication that was prescribed at discharge which he has been taking.  Also told patient that at his next follow-up appointment he could discuss restarting those medications with Dr. Donata ClayVan Trigt.  His daughter acknowledged receipt and thanked me for the return call.

## 2018-03-20 ENCOUNTER — Telehealth (HOSPITAL_COMMUNITY): Payer: Self-pay

## 2018-03-27 ENCOUNTER — Encounter: Payer: Self-pay | Admitting: Cardiology

## 2018-03-27 ENCOUNTER — Ambulatory Visit: Payer: BLUE CROSS/BLUE SHIELD | Admitting: Cardiology

## 2018-03-27 VITALS — BP 124/86 | HR 77 | Ht 62.0 in | Wt 135.0 lb

## 2018-03-27 DIAGNOSIS — E119 Type 2 diabetes mellitus without complications: Secondary | ICD-10-CM

## 2018-03-27 DIAGNOSIS — Z951 Presence of aortocoronary bypass graft: Secondary | ICD-10-CM

## 2018-03-27 DIAGNOSIS — E785 Hyperlipidemia, unspecified: Secondary | ICD-10-CM

## 2018-03-27 DIAGNOSIS — I214 Non-ST elevation (NSTEMI) myocardial infarction: Secondary | ICD-10-CM | POA: Diagnosis not present

## 2018-03-27 DIAGNOSIS — I771 Stricture of artery: Secondary | ICD-10-CM | POA: Diagnosis not present

## 2018-03-27 HISTORY — DX: Hyperlipidemia, unspecified: E78.5

## 2018-03-27 HISTORY — DX: Type 2 diabetes mellitus without complications: E11.9

## 2018-03-27 NOTE — Assessment & Plan Note (Signed)
On Glucophage 

## 2018-03-27 NOTE — Assessment & Plan Note (Signed)
03/04/18- Coronary artery bypass grafting x3 (free left internal mammary artery graft to left anterior descending, saphenous vein graft to obtuse marginal, saphenous vein graft to diagonal. Normal LVF pre op

## 2018-03-27 NOTE — Assessment & Plan Note (Signed)
Left subclavian stenosis 75% with pressure gradient of 60 mm Hg at cath

## 2018-03-27 NOTE — Assessment & Plan Note (Signed)
Troponin peak 0.11 03/01/18

## 2018-03-27 NOTE — Patient Instructions (Addendum)
Medication Instructions:  Your physician recommends that you continue on your current medications as directed. Please refer to the Current Medication list given to you today.  Labwork: None   Testing/Procedures: None   Follow-Up: Your physician recommends that you schedule a follow-up appointment in: 4 weeks with Dr Allyson SabalBerry.  Any Other Special Instructions Will Be Listed Below (If Applicable). Needs pcp-list provided If you need a refill on your cardiac medications before your next appointment, please call your pharmacy.

## 2018-03-27 NOTE — Assessment & Plan Note (Signed)
LDL 112 on admission-placed on high dose statin rx

## 2018-03-27 NOTE — Progress Notes (Signed)
03/27/2018 Nicholas [name]   Mar 26, 1956  [id number]  Primary Physician Nicholas Jo, MD Primary Cardiologist: Dr Nicholas [name] (new)  HPI:  62 y/o male from [place] with a history of LSCA stenosis, (seen by Dr Nicholas [name] in 2013) HTN,  and NIDDM, presented 02/27/18 with chest pain and EKG changes. He ruled in for a NSTEMI with peak troponin of 0.11. He was taken to the cath lab 02/28/18 where cath showed severe 3V CAD. Echo showed normal LVF.  He underwent CABG x 3 on 03/04/18 (using a free LIMA). He tolerated this well. He had an unremarkable post op course and is seen in the office today for follow up. His daughter accompanied him and helped with the language barrier.   The patient has done well since discharge. He denies any unusual dyspnea. His main complaint was numbness over his Lt breast. He has noticed some palpations at night but no sustained tachycardia. His EKG today shows NSR.   Current Outpatient Medications  Medication Sig Dispense Refill  . aspirin EC 325 MG EC tablet Take 1 tablet (325 mg total) by mouth daily. 30 tablet 0  . atorvastatin (LIPITOR) 80 MG tablet Take 1 tablet (80 mg total) by mouth daily at 6 PM. 30 tablet 1  . finasteride (PROSCAR) 5 MG tablet Take 5 mg by mouth daily.    . metFORMIN (GLUCOPHAGE) 500 MG tablet Take 500 mg by mouth 2 (two) times daily with a meal.    . metoprolol tartrate (LOPRESSOR) 25 MG tablet Take 1 tablet (25 mg total) by mouth 2 (two) times daily. 60 tablet 1   No current facility-administered medications for this visit.     Allergies  Allergen Reactions  . Eggs Or Egg-Derived Products Itching and Rash    Past Medical History:  Diagnosis Date  . Diabetes mellitus   . Heart murmur   . Hypertension     Social History   Socioeconomic History  . Marital status: Married    Spouse name: Not on file  . Number of children: Not on file  . Years of education: Not on file  . Highest education level: Not on file  Occupational History  . Not on file    Social Needs  . Financial resource strain: Not on file  . Food insecurity:    Worry: Not on file    Inability: Not on file  . Transportation needs:    Medical: Not on file    Non-medical: Not on file  Tobacco Use  . Smoking status: Never Smoker  . Smokeless tobacco: Never Used  Substance and Sexual Activity  . Alcohol use: No  . Drug use: No  . Sexual activity: Not on file  Lifestyle  . Physical activity:    Days per week: Not on file    Minutes per session: Not on file  . Stress: Not on file  Relationships  . Social connections:    Talks on phone: Not on file    Gets together: Not on file    Attends religious service: Not on file    Active member of club or organization: Not on file    Attends meetings of clubs or organizations: Not on file    Relationship status: Not on file  . Intimate partner violence:    Fear of current or ex partner: Not on file    Emotionally abused: Not on file    Physically abused: Not on file    Forced sexual activity: Not on file  Other Topics Concern  . Not on file  Social History Narrative  . Not on file     Family History  Problem Relation Age of Onset  . Heart disease Mother      Review of Systems: General: negative for chills, fever, night sweats or weight changes.  Cardiovascular: negative for chest pain, dyspnea on exertion, edema, orthopnea,paroxysmal nocturnal dyspnea or shortness of breath Dermatological: negative for rash Respiratory: negative for cough or wheezing Urologic: negative for hematuria Abdominal: negative for nausea, vomiting, diarrhea, bright red blood per rectum, melena, or hematemesis Neurologic: negative for visual changes, syncope, or dizziness All other systems reviewed and are otherwise negative except as noted above.    Blood pressure 124/86, pulse 77, height 5\' 2"  (1.575 m), weight 135 lb (61.2 kg).  General appearance: alert, cooperative and no distress Neck: no carotid bruit and no JVD Lungs:  clear to auscultation bilaterally Heart: regular rate and rhythm Extremities: extremities normal, atraumatic, no cyanosis or edema Skin: Skin color, texture, turgor normal. No rashes or lesions Neurologic: Grossly normal  EKG NSR, TWI V3  ASSESSMENT AND PLAN:   Non-ST elevation (NSTEMI) myocardial infarction (HCC) Troponin peak 0.11 03/01/18  S/P CABG x 3 03/04/18- Coronary artery bypass grafting x3 (free left internal mammary artery graft to left anterior descending, saphenous vein graft to obtuse marginal, saphenous vein graft to diagonal. Normal LVF pre op   Subclavian arterial stenosis (HCC) Left subclavian stenosis 75% with pressure gradient of 60 mm Hg at cath  Non-insulin dependent type 2 diabetes mellitus (HCC) On Glucophage  Dyslipidemia, goal LDL below 70 LDL 112 on admission-placed on high dose statin rx   PLAN  Same Rx for now. I'll arrange f/u with Dr Nicholas SabalBerry in 4 weeks to further evaluate his LSCA stenosis.   Nicholas ShelterLuke Miqueas Whilden [name] 03/27/2018 12:48 PM

## 2018-03-29 ENCOUNTER — Telehealth (HOSPITAL_COMMUNITY): Payer: Self-pay

## 2018-03-29 NOTE — Telephone Encounter (Signed)
Attempted to contact patient in regards to Cardiac Rehab - lm on vm °

## 2018-04-05 ENCOUNTER — Encounter (HOSPITAL_COMMUNITY): Payer: Self-pay

## 2018-04-05 ENCOUNTER — Telehealth (HOSPITAL_COMMUNITY): Payer: Self-pay

## 2018-04-05 NOTE — Telephone Encounter (Signed)
Attempted to call pt a 2nd time- LM ON VM ° °Mailed letter out °

## 2018-04-07 ENCOUNTER — Other Ambulatory Visit: Payer: Self-pay | Admitting: Physician Assistant

## 2018-04-10 ENCOUNTER — Other Ambulatory Visit: Payer: Self-pay

## 2018-04-10 ENCOUNTER — Ambulatory Visit
Admission: RE | Admit: 2018-04-10 | Discharge: 2018-04-10 | Disposition: A | Discharge status: Home / Self Care | Payer: BLUE CROSS/BLUE SHIELD | Source: Ambulatory Visit | Attending: Cardiothoracic Surgery | Admitting: Cardiothoracic Surgery

## 2018-04-10 ENCOUNTER — Other Ambulatory Visit: Payer: Self-pay | Admitting: Cardiothoracic Surgery

## 2018-04-10 ENCOUNTER — Encounter: Payer: Self-pay | Admitting: Cardiothoracic Surgery

## 2018-04-10 ENCOUNTER — Ambulatory Visit (INDEPENDENT_AMBULATORY_CARE_PROVIDER_SITE_OTHER): Payer: Self-pay | Admitting: Cardiothoracic Surgery

## 2018-04-10 ENCOUNTER — Ambulatory Visit: Payer: Self-pay | Admitting: Cardiothoracic Surgery

## 2018-04-10 VITALS — BP 140/84 | HR 74 | Resp 16 | Ht 62.0 in | Wt 134.8 lb

## 2018-04-10 DIAGNOSIS — Z951 Presence of aortocoronary bypass graft: Secondary | ICD-10-CM

## 2018-04-10 DIAGNOSIS — I214 Non-ST elevation (NSTEMI) myocardial infarction: Secondary | ICD-10-CM

## 2018-04-10 NOTE — Progress Notes (Signed)
PCP is Amelia JoLewit, Eliot, MD Referring Provider is Lyn RecordsSmith, Henry W, MD  Chief Complaint  Patient presents with  . Routine Post Op    s/p CANG X 3...03/04/18 with a CXR    HPI: Patient returns for one-month follow-up after undergoing urgent CABG x3 for non-STEMI after mowing his lawn.  He had inferior wall hypokinesia with overall EF 50%.  He underwent bypass grafts to the LAD with free left IMA because of subclavian artery stenosis and vein graft to the OM and diagonal.  The distal RCA was atretic and too small to graft.  He did well after surgery and was discharged home in sinus rhythm.  He continues to do well without recurrent symptoms of shortness of breath or chest pain.  He was seen in the cardiology office without changes in his current medications. Chest x-ray performed today shows clear lung fields, sternal wires intact, no pleural effusion. His blood sugars have been running 110-120 in the mornings. Past Medical History:  Diagnosis Date  . Diabetes mellitus   . Heart murmur   . Hypertension     Past Surgical History:  Procedure Laterality Date  . CORONARY ARTERY BYPASS GRAFT N/A 03/04/2018   Procedure: CORONARY ARTERY BYPASS GRAFTING (CABG) x 3: -FREE LIMA to LAD, -SVG to DIAGONAL, -SVG to OM; ENDOSCOPIC HARVEST GREATER SAPHENOUS VEIN: -Right Thigh  ;  Surgeon: Kerin PernaVan Trigt, Taijuan Serviss, MD;  [facility];  Service: Open Heart Surgery;  Laterality: N/A;  . EYE SURGERY    . INTRAVASCULAR PRESSURE WIRE/FFR STUDY N/A 03/01/2018   Procedure: INTRAVASCULAR PRESSURE WIRE/FFR STUDY;  Surgeon: [place], Breckan Cafiero M, MD;  [facility];  Service: Cardiovascular;  Laterality: N/A;  . LEFT HEART CATH AND CORONARY ANGIOGRAPHY N/A 03/01/2018   Procedure: LEFT HEART CATH AND CORONARY ANGIOGRAPHY;  Surgeon: [place], Ethon Wymer M, MD;  [facility];  Service: Cardiovascular;  Laterality: N/A;  . TEE WITHOUT CARDIOVERSION N/A 03/04/2018   Procedure: TRANSESOPHAGEAL ECHOCARDIOGRAM (TEE);  Surgeon:  Donata ClayVan Trigt, Theron AristaPeter, MD;  [facility];  Service: Open Heart Surgery;  Laterality: N/A;    Family History  Problem Relation Age of Onset  . Heart disease Mother     Social History Social History   Tobacco Use  . Smoking status: Never Smoker  . Smokeless tobacco: Never Used  Substance Use Topics  . Alcohol use: No  . Drug use: No    Current Outpatient Medications  Medication Sig Dispense Refill  . aspirin EC 325 MG EC tablet Take 1 tablet (325 mg total) by mouth daily. 30 tablet 0  . atorvastatin (LIPITOR) 80 MG tablet Take 1 tablet (80 mg total) by mouth daily at 6 PM. 30 tablet 1  . finasteride (PROSCAR) 5 MG tablet Take 5 mg by mouth daily.    . metFORMIN (GLUCOPHAGE) 500 MG tablet Take 500 mg by mouth 2 (two) times daily with a meal.    . metoprolol tartrate (LOPRESSOR) 25 MG tablet Take 1 tablet (25 mg total) by mouth 2 (two) times daily. 60 tablet 1   No current facility-administered medications for this visit.     Allergies  Allergen Reactions  . Eggs Or Egg-Derived Products Itching and Rash    Review of Systems  No sternal clicking or popping sensation Strength and appetite improving No ankle edema  BP 140/84 (BP Location: Right Arm, Patient Position: Sitting, Cuff Size: Normal)   Pulse 74   Resp 16   Ht 5\' 2"  (1.575 m)  Wt 134 lb 12.8 oz (61.1 kg)   SpO2 97% Comment: ON RA  BMI 24.66 kg/m  Physical Exam      Exam    General- alert and comfortable    Neck- no JVD, no cervical adenopathy palpable, no carotid bruit   Lungs- clear without rales, wheezes   Cor- regular rate and rhythm, no murmur , gallop   Abdomen- soft, non-tender   Extremities - warm, non-tender, minimal edema   Neuro- oriented, appropriate, no focal weakness  Diagnostic Tests:  Chest x-ray clear Impression: Excellent early recovery after multivessel CABG after non-STEMI Patient not ready to return to work yet- we will see him in about 4 weeks to discuss return to work.  He will  be able to start cardiac rehab, driving, and lift up to 15 pounds at this time.  He should continue his current medications and heart healthy diet.  Plan: Return in 4 weeks to review progress and discuss return to work.   Mikey BussingPeter Van Trigt III, MD [facility] [phone]

## 2018-04-12 ENCOUNTER — Telehealth (HOSPITAL_COMMUNITY): Payer: Self-pay

## 2018-04-12 NOTE — Telephone Encounter (Signed)
Called and spoke with patient in regards to Cardiac Rehab - Patient stated he is not interested. Closed referral.

## 2018-04-16 ENCOUNTER — Ambulatory Visit: Payer: Self-pay

## 2018-04-24 ENCOUNTER — Encounter: Payer: Self-pay | Admitting: Cardiovascular Disease

## 2018-04-24 ENCOUNTER — Ambulatory Visit (INDEPENDENT_AMBULATORY_CARE_PROVIDER_SITE_OTHER): Payer: BLUE CROSS/BLUE SHIELD | Admitting: Cardiovascular Disease

## 2018-04-24 VITALS — BP 146/87 | HR 67 | Ht 62.0 in | Wt 139.6 lb

## 2018-04-24 DIAGNOSIS — E785 Hyperlipidemia, unspecified: Secondary | ICD-10-CM

## 2018-04-24 DIAGNOSIS — I771 Stricture of artery: Secondary | ICD-10-CM | POA: Diagnosis not present

## 2018-04-24 NOTE — Progress Notes (Signed)
04/24/2018 Nicholas [name]   [birth year]  [id number]  Primary Physician Nicholas Jo, MD Primary Cardiologist: Nicholas Gess MD Nicholas [name], [place], [place]  HPI:  Nicholas [name] is a 62 y.o. [place] male, married, father of 4, grandfather 5 grandchildren who is accompanied by his daughter Nicholas [name].  He has a history of treated hypertension, hyperlipidemia and diabetes.  He had a non-STEMI on 02/27/2018 and underwent cardiac catheterization by Dr. Swaziland 2 days later revealing three-vessel disease.  On 03/04/2018 he underwent coronary artery bypass grafting x3 with a free LIMA to the LAD because of documented 75% left subclavian artery stenosis, vein to a diagonal branch and obtuse marginal branch.  His postop course is uncomplicated.  He denies symptoms of subclavian steal or upper extremity claudication currently.   Current Meds  Medication Sig  . aspirin EC 325 MG EC tablet Take 1 tablet (325 mg total) by mouth daily.  Marland Kitchen atorvastatin (LIPITOR) 80 MG tablet Take 1 tablet (80 mg total) by mouth daily at 6 PM.  . finasteride (PROSCAR) 5 MG tablet Take 5 mg by mouth daily.  . metFORMIN (GLUCOPHAGE) 500 MG tablet Take 500 mg by mouth 2 (two) times daily with a meal.  . metoprolol tartrate (LOPRESSOR) 25 MG tablet Take 1 tablet (25 mg total) by mouth 2 (two) times daily.     Allergies  Allergen Reactions  . Eggs Or Egg-Derived Products Itching and Rash    Social History   Socioeconomic History  . Marital status: Married    Spouse name: Not on file  . Number of children: Not on file  . Years of education: Not on file  . Highest education level: Not on file  Occupational History  . Not on file  Social Needs  . Financial resource strain: Not on file  . Food insecurity:    Worry: Not on file    Inability: Not on file  . Transportation needs:    Medical: Not on file    Non-medical: Not on file  Tobacco Use  . Smoking status: Never Smoker  . Smokeless tobacco: Never Used  Substance and  Sexual Activity  . Alcohol use: No  . Drug use: No  . Sexual activity: Not on file  Lifestyle  . Physical activity:    Days per week: Not on file    Minutes per session: Not on file  . Stress: Not on file  Relationships  . Social connections:    Talks on phone: Not on file    Gets together: Not on file    Attends religious service: Not on file    Active member of club or organization: Not on file    Attends meetings of clubs or organizations: Not on file    Relationship status: Not on file  . Intimate partner violence:    Fear of current or ex partner: Not on file    Emotionally abused: Not on file    Physically abused: Not on file    Forced sexual activity: Not on file  Other Topics Concern  . Not on file  Social History Narrative  . Not on file     Review of Systems: General: negative for chills, fever, night sweats or weight changes.  Cardiovascular: negative for chest pain, dyspnea on exertion, edema, orthopnea, palpitations, paroxysmal nocturnal dyspnea or shortness of breath Dermatological: negative for rash Respiratory: negative for cough or wheezing Urologic: negative for hematuria Abdominal: negative for nausea, vomiting, diarrhea, bright red blood per  rectum, melena, or hematemesis Neurologic: negative for visual changes, syncope, or dizziness All other systems reviewed and are otherwise negative except as noted above.    Blood pressure (!) 146/87, pulse 67, height 5\' 2"  (1.575 m), weight 139 lb 9.6 oz (63.3 kg).  General appearance: alert and no distress Neck: no adenopathy, no JVD, supple, symmetrical, trachea midline, thyroid not enlarged, symmetric, no tenderness/mass/nodules and Left carotid bruit Lungs: clear to auscultation bilaterally Heart: regular rate and rhythm, S1, S2 normal, no murmur, click, rub or gallop Extremities: extremities normal, atraumatic, no cyanosis or edema Pulses: 2+ and symmetric Skin: Skin color, texture, turgor normal. No rashes  or lesions Neurologic: Alert and oriented X 3, normal strength and tone. Normal symmetric reflexes. Normal coordination and gait  EKG not performed today  ASSESSMENT AND PLAN:   Subclavian arterial stenosis (HCC) History of known left subclavian artery stenosis with a 60 mm gradient demonstrated angiographically as well as by duplex ultrasound.  He currently is asymptomatic.  I will get upper extremity Dopplers and carotid Dopplers in 6 months and see him back after that for further evaluation.  If he is symptomatic this will would probably be amenable to PTA and stenting.  S/P CABG x 3 History of non-STEMI status post cardiac catheterization performed by Dr. SwazilandJordan 03/01/2018 revealing three-vessel disease.  Ultimately he underwent coronary artery bypass grafting x3 by Dr. Maren [name] on 03/04/2018 with a free LIMA to the LAD because of subclavian stenosis, vein to obtuse marginal branch and to a diagonal branch.  His postop course was uncomplicated.  Dyslipidemia, goal LDL below 70 She of hyperlipidemia on statin therapy.  We will recheck a lipid and liver profile.      Nicholas GessJonathan J. Aston Lawhorn MD FACP,FACC,FAHA, [facility] 04/24/2018 11:12 AM

## 2018-04-24 NOTE — Assessment & Plan Note (Signed)
History of non-STEMI status post cardiac catheterization performed by Dr. SwazilandJordan 03/01/2018 revealing three-vessel disease.  Ultimately he underwent coronary artery bypass grafting x3 by Dr. Maren BeachVantrigt on 03/04/2018 with a free LIMA to the LAD because of subclavian stenosis, vein to obtuse marginal branch and to a diagonal branch.  His postop course was uncomplicated.

## 2018-04-24 NOTE — Assessment & Plan Note (Signed)
History of known left subclavian artery stenosis with a 60 mm gradient demonstrated angiographically as well as by duplex ultrasound.  He currently is asymptomatic.  I will get upper extremity Dopplers and carotid Dopplers in 6 months and see him back after that for further evaluation.  If he is symptomatic this will would probably be amenable to PTA and stenting.

## 2018-04-24 NOTE — Assessment & Plan Note (Signed)
She of hyperlipidemia on statin therapy.  We will recheck a lipid and liver profile.

## 2018-04-24 NOTE — Patient Instructions (Addendum)
Medication Instructions:  Your physician recommends that you continue on your current medications as directed. Please refer to the Current Medication list given to you today.   Labwork: Your physician recommends that you return for lab work in: FASTING   Testing/Procedures: Your physician has requested that you have a carotid duplex. This test is an ultrasound of the carotid arteries in your neck. It looks at blood flow through these arteries that supply the brain with blood. Allow one hour for this exam. There are no restrictions or special instructions. SCHEDULE January 2020  Your physician has requested that you have a UPPER extremity arterial doppler- During this test, ultrasound is used to evaluate arterial blood flow in the ARMS. Allow approximately one hour for this exam.  SCHEDULE January 2020   BOTH NEED TO BE COMPLETED A WEEK BEFORE DR. Allyson SabalBERRY F/U APPT.   Follow-Up: Your physician wants you to follow-up in: 6 MONTHS WITH DR. San MorelleBERRY You will receive a reminder letter in the mail two months in advance. If you don't receive a letter, please call our office to schedule the follow-up appointment.    Any Other Special Instructions Will Be Listed Below (If Applicable).     If you need a refill on your cardiac medications before your next appointment, please call your pharmacy.

## 2018-04-26 LAB — HEPATIC FUNCTION PANEL
ALK PHOS: 97 IU/L (ref 39–117)
ALT: 25 IU/L (ref 0–44)
AST: 19 IU/L (ref 0–40)
Albumin: 4.3 g/dL (ref 3.6–4.8)
BILIRUBIN TOTAL: 0.3 mg/dL (ref 0.0–1.2)
BILIRUBIN, DIRECT: 0.12 mg/dL (ref 0.00–0.40)
Total Protein: 7.4 g/dL (ref 6.0–8.5)

## 2018-04-26 LAB — LIPID PANEL
CHOL/HDL RATIO: 3.2 ratio (ref 0.0–5.0)
CHOLESTEROL TOTAL: 124 mg/dL (ref 100–199)
HDL: 39 mg/dL — AB (ref >39)
LDL Calculated: 56 mg/dL (ref 0–99)
TRIGLYCERIDES: 146 mg/dL (ref 0–149)
VLDL Cholesterol Cal: 29 mg/dL (ref 5–40)

## 2018-05-08 ENCOUNTER — Other Ambulatory Visit: Payer: Self-pay

## 2018-05-08 ENCOUNTER — Encounter: Payer: Self-pay | Admitting: Cardiothoracic Surgery

## 2018-05-08 ENCOUNTER — Other Ambulatory Visit: Payer: Self-pay | Admitting: *Deleted

## 2018-05-08 ENCOUNTER — Ambulatory Visit (INDEPENDENT_AMBULATORY_CARE_PROVIDER_SITE_OTHER): Payer: Self-pay | Admitting: Cardiothoracic Surgery

## 2018-05-08 ENCOUNTER — Other Ambulatory Visit: Payer: Self-pay | Admitting: Physician Assistant

## 2018-05-08 VITALS — BP 180/100 | HR 72 | Ht 62.0 in | Wt 141.4 lb

## 2018-05-08 DIAGNOSIS — E785 Hyperlipidemia, unspecified: Secondary | ICD-10-CM

## 2018-05-08 DIAGNOSIS — Z951 Presence of aortocoronary bypass graft: Secondary | ICD-10-CM

## 2018-05-08 DIAGNOSIS — I214 Non-ST elevation (NSTEMI) myocardial infarction: Secondary | ICD-10-CM

## 2018-05-08 DIAGNOSIS — I2510 Atherosclerotic heart disease of native coronary artery without angina pectoris: Secondary | ICD-10-CM

## 2018-05-08 MED ORDER — ATORVASTATIN CALCIUM 80 MG PO TABS: 80.0000 mg | ORAL_TABLET | Freq: Every day | ORAL | 3 refills | Status: DC

## 2018-05-08 NOTE — Progress Notes (Signed)
PCP is Achreja, Youlanda Mighty, MD Referring Provider is Lyn Records, MD  Chief Complaint  Patient presents with  . Routine Post Op    4 wk f/u s/p CABG 03/04/18    HPI: Patient returns for scheduled visit 8 weeks after urgent multivessel CABG.  He is doing well without recurrent angina.  Surgical incisions are well-healed.  He still has some mild soreness in his chest incision and back.  He walks 20 minutes at most 3-4 times a week and understands he should walk at least 20 to 30 minutes 5 days out of 7.  He is not doing cardiac rehab at the hospital.  He has been compliant with his discharge medications.  Incisions are all healing well.  Last chest x-ray is clear.  Patient is interested in returning to work.  However he will not be able to do heavy strenuous activity until after January 1.  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   Past Medical History:  Diagnosis Date  . Diabetes mellitus   . Heart murmur   . Hypertension     Past Surgical History:  Procedure Laterality Date  . CORONARY ARTERY BYPASS GRAFT N/A 03/04/2018   Procedure: CORONARY ARTERY BYPASS GRAFTING (CABG) x 3: -FREE LIMA to LAD, -SVG to DIAGONAL, -SVG to OM; ENDOSCOPIC HARVEST GREATER SAPHENOUS VEIN: -Right Thigh  ;  Surgeon: Kerin Perna, MD;  Location: [facility] OR;  Service: Open Heart Surgery;  Laterality: N/A;  . EYE SURGERY    . INTRAVASCULAR PRESSURE WIRE/FFR STUDY N/A 03/01/2018   Procedure: INTRAVASCULAR PRESSURE WIRE/FFR STUDY;  Surgeon: Swaziland, Cailyn Houdek M, MD;  Location: [facility] INVASIVE CV LAB;  Service: Cardiovascular;  Laterality: N/A;  . LEFT HEART CATH AND CORONARY ANGIOGRAPHY N/A 03/01/2018   Procedure: LEFT HEART CATH AND CORONARY ANGIOGRAPHY;  Surgeon: Swaziland, Gabriellia Rempel M, MD;  Location: [facility] INVASIVE CV LAB;  Service: Cardiovascular;  Laterality: N/A;  . TEE WITHOUT CARDIOVERSION N/A 03/04/2018   Procedure: TRANSESOPHAGEAL ECHOCARDIOGRAM  (TEE);  Surgeon: Donata Clay, Theron Arista, MD;  Location: [facility] OR;  Service: Open Heart Surgery;  Laterality: N/A;    Family History  Problem Relation Age of Onset  . Heart disease Mother     Social History Social History   Tobacco Use  . Smoking status: Never Smoker  . Smokeless tobacco: Never Used  Substance Use Topics  . Alcohol use: No  . Drug use: No    Current Outpatient Medications  Medication Sig Dispense Refill  . aspirin EC 325 MG EC tablet Take 1 tablet (325 mg total) by mouth daily. 30 tablet 0  . finasteride (PROSCAR) 5 MG tablet Take 5 mg by mouth daily.    . metFORMIN (GLUCOPHAGE) 500 MG tablet Take 500 mg by mouth 2 (two) times daily with a meal.    . metoprolol tartrate (LOPRESSOR) 25 MG tablet Take 1 tablet (25 mg total) by mouth 2 (two) times daily. 60 tablet 1  . atorvastatin (LIPITOR) 80 MG tablet Take 1 tablet (80 mg total) by mouth daily at 6 PM. 90 tablet 3   No current facility-administered medications for this visit.     Allergies  Allergen Reactions  . Eggs Or Egg-Derived Products Itching and Rash    Review of Systems  Appetite and strength improved No fever No edema   Physical Exam Blood pressure 160/85 heart rate 68 oxygen saturation 97%  Exam    General- alert and comfortable    Neck- no JVD, no cervical adenopathy palpable, no carotid bruit   Lungs- clear without rales, wheezes   Cor- regular rate and rhythm, no murmur , gallop   Abdomen- soft, non-tender   Extremities - warm, non-tender, minimal edema   Neuro- oriented, appropriate, no focal weakness  Diagnostic Tests: None  Impression:  Excellent early recovery after multivessel CABG Plan: Patient can return to work doing nonstrenuous activity. Patient has a left subclavian artery stenosis and will need a repeat CTA scan to assess the severity in approximately 6 to 10 months.  He will return for review of progress in 3 months.  Continue current medications.  Mikey Bussing, MD [facility] [phone]

## 2018-05-10 ENCOUNTER — Other Ambulatory Visit: Payer: Self-pay | Admitting: Physician Assistant

## 2018-05-14 ENCOUNTER — Telehealth: Payer: Self-pay

## 2018-05-14 ENCOUNTER — Encounter (HOSPITAL_COMMUNITY): Payer: Self-pay | Admitting: Emergency Medicine

## 2018-05-14 ENCOUNTER — Emergency Department (HOSPITAL_COMMUNITY): Payer: BLUE CROSS/BLUE SHIELD

## 2018-05-14 ENCOUNTER — Other Ambulatory Visit: Payer: Self-pay

## 2018-05-14 ENCOUNTER — Inpatient Hospital Stay (HOSPITAL_COMMUNITY)
Admission: EM | Admit: 2018-05-14 | Discharge: 2018-05-21 | DRG: 247 | Disposition: A | Discharge status: Home / Self Care | Payer: BLUE CROSS/BLUE SHIELD | Attending: Cardiovascular Disease | Admitting: Cardiovascular Disease

## 2018-05-14 ENCOUNTER — Telehealth: Payer: Self-pay | Admitting: Cardiovascular Disease

## 2018-05-14 DIAGNOSIS — Z955 Presence of coronary angioplasty implant and graft: Secondary | ICD-10-CM

## 2018-05-14 DIAGNOSIS — Z7982 Long term (current) use of aspirin: Secondary | ICD-10-CM

## 2018-05-14 DIAGNOSIS — I081 Rheumatic disorders of both mitral and tricuspid valves: Secondary | ICD-10-CM | POA: Diagnosis present

## 2018-05-14 DIAGNOSIS — R079 Chest pain, unspecified: Secondary | ICD-10-CM

## 2018-05-14 DIAGNOSIS — I25110 Atherosclerotic heart disease of native coronary artery with unstable angina pectoris: Secondary | ICD-10-CM | POA: Diagnosis present

## 2018-05-14 DIAGNOSIS — E119 Type 2 diabetes mellitus without complications: Secondary | ICD-10-CM

## 2018-05-14 DIAGNOSIS — I25700 Atherosclerosis of coronary artery bypass graft(s), unspecified, with unstable angina pectoris: Secondary | ICD-10-CM | POA: Diagnosis present

## 2018-05-14 DIAGNOSIS — R748 Abnormal levels of other serum enzymes: Secondary | ICD-10-CM | POA: Diagnosis not present

## 2018-05-14 DIAGNOSIS — M25512 Pain in left shoulder: Secondary | ICD-10-CM | POA: Diagnosis not present

## 2018-05-14 DIAGNOSIS — Z79899 Other long term (current) drug therapy: Secondary | ICD-10-CM

## 2018-05-14 DIAGNOSIS — E785 Hyperlipidemia, unspecified: Secondary | ICD-10-CM | POA: Diagnosis present

## 2018-05-14 DIAGNOSIS — Z951 Presence of aortocoronary bypass graft: Secondary | ICD-10-CM

## 2018-05-14 DIAGNOSIS — Z8249 Family history of ischemic heart disease and other diseases of the circulatory system: Secondary | ICD-10-CM

## 2018-05-14 DIAGNOSIS — I255 Ischemic cardiomyopathy: Secondary | ICD-10-CM | POA: Diagnosis present

## 2018-05-14 DIAGNOSIS — I10 Essential (primary) hypertension: Secondary | ICD-10-CM | POA: Diagnosis present

## 2018-05-14 DIAGNOSIS — Z7984 Long term (current) use of oral hypoglycemic drugs: Secondary | ICD-10-CM

## 2018-05-14 DIAGNOSIS — I214 Non-ST elevation (NSTEMI) myocardial infarction: Principal | ICD-10-CM

## 2018-05-14 DIAGNOSIS — I495 Sick sinus syndrome: Secondary | ICD-10-CM | POA: Diagnosis present

## 2018-05-14 DIAGNOSIS — R9431 Abnormal electrocardiogram [ECG] [EKG]: Secondary | ICD-10-CM

## 2018-05-14 DIAGNOSIS — I252 Old myocardial infarction: Secondary | ICD-10-CM

## 2018-05-14 DIAGNOSIS — Z91012 Allergy to eggs: Secondary | ICD-10-CM

## 2018-05-14 HISTORY — DX: Nausea with vomiting, unspecified: R11.2

## 2018-05-14 HISTORY — DX: Non-ST elevation (NSTEMI) myocardial infarction: I21.4

## 2018-05-14 HISTORY — DX: Atherosclerotic heart disease of native coronary artery without angina pectoris: I25.10

## 2018-05-14 HISTORY — DX: Type 2 diabetes mellitus without complications: E11.9

## 2018-05-14 HISTORY — DX: Pure hypercholesterolemia, unspecified: E78.00

## 2018-05-14 HISTORY — DX: Other specified postprocedural states: Z98.890

## 2018-05-14 LAB — GLUCOSE, CAPILLARY: GLUCOSE-CAPILLARY: 195 mg/dL — AB (ref 70–99)

## 2018-05-14 LAB — CREATININE, SERUM
Creatinine, Ser: 0.79 mg/dL (ref 0.61–1.24)
GFR calc Af Amer: >60 mL/min (ref >60)

## 2018-05-14 LAB — CBC
HCT: 41.4 % (ref 39.0–52.0)
HCT: 38.6 % — ABNORMAL LOW (ref 39.0–52.0)
HEMOGLOBIN: 12.2 g/dL — AB (ref 13.0–17.0)
Hemoglobin: 13 g/dL (ref 13.0–17.0)
MCH: 25.9 pg — ABNORMAL LOW (ref 26.0–34.0)
MCH: 25.9 pg — ABNORMAL LOW (ref 26.0–34.0)
MCHC: 31.4 g/dL (ref 30.0–36.0)
MCHC: 31.6 g/dL (ref 30.0–36.0)
MCV: 82.6 fL (ref 78.0–100.0)
MCV: 82 fL (ref 78.0–100.0)
PLATELETS: 249 10*3/uL (ref 150–400)
Platelets: 264 10*3/uL (ref 150–400)
RBC: 5.01 MIL/uL (ref 4.22–5.81)
RBC: 4.71 MIL/uL (ref 4.22–5.81)
RDW: 14.3 % (ref 11.5–15.5)
RDW: 14.3 % (ref 11.5–15.5)
WBC: 13.5 10*3/uL — ABNORMAL HIGH (ref 4.0–10.5)
WBC: 12.5 10*3/uL — ABNORMAL HIGH (ref 4.0–10.5)

## 2018-05-14 LAB — BASIC METABOLIC PANEL
Anion gap: 14 (ref 5–15)
BUN: 12 mg/dL (ref 8–23)
CO2: 21 mmol/L — ABNORMAL LOW (ref 22–32)
Calcium: 9.3 mg/dL (ref 8.9–10.3)
Chloride: 102 mmol/L (ref 98–111)
Creatinine, Ser: 0.81 mg/dL (ref 0.61–1.24)
GFR calc Af Amer: >60 mL/min (ref >60)
GFR calc non Af Amer: >60 mL/min (ref >60)
Glucose, Bld: 165 mg/dL — ABNORMAL HIGH (ref 70–99)
Potassium: 4 mmol/L (ref 3.5–5.1)
Sodium: 137 mmol/L (ref 135–145)

## 2018-05-14 LAB — TROPONIN I: Troponin I: 2.39 ng/mL (ref <0.03)

## 2018-05-14 LAB — I-STAT TROPONIN, ED: Troponin i, poc: 0.43 ng/mL (ref 0.00–0.08)

## 2018-05-14 MED ORDER — INSULIN ASPART 100 UNIT/ML ~~LOC~~ SOLN
0.0000 [IU] | Freq: Three times a day (TID) | SUBCUTANEOUS | Status: DC
  Administered 2018-05-15 (×2): 2 [IU] via SUBCUTANEOUS
  Administered 2018-05-16: 5 [IU] via SUBCUTANEOUS
  Administered 2018-05-16: 3 [IU] via SUBCUTANEOUS
  Administered 2018-05-17 (×2): 2 [IU] via SUBCUTANEOUS
  Administered 2018-05-17: 3 [IU] via SUBCUTANEOUS
  Administered 2018-05-18: 2 [IU] via SUBCUTANEOUS
  Administered 2018-05-18: 7 [IU] via SUBCUTANEOUS
  Administered 2018-05-18: 3 [IU] via SUBCUTANEOUS
  Administered 2018-05-19: 2 [IU] via SUBCUTANEOUS
  Administered 2018-05-19: 7 [IU] via SUBCUTANEOUS
  Administered 2018-05-19: 2 [IU] via SUBCUTANEOUS
  Administered 2018-05-20: 17:00:00 5 [IU] via SUBCUTANEOUS
  Administered 2018-05-20: 1 [IU] via SUBCUTANEOUS
  Administered 2018-05-20 – 2018-05-21 (×2): 2 [IU] via SUBCUTANEOUS
  Administered 2018-05-21: 5 [IU] via SUBCUTANEOUS

## 2018-05-14 MED ORDER — HEPARIN BOLUS VIA INFUSION
3800.0000 [IU] | Freq: Once | INTRAVENOUS | Status: AC
  Administered 2018-05-14: 3800 [IU] via INTRAVENOUS
  Filled 2018-05-14: qty 3800

## 2018-05-14 MED ORDER — ATORVASTATIN CALCIUM 80 MG PO TABS
80.0000 mg | ORAL_TABLET | Freq: Every day | ORAL | Status: DC
  Administered 2018-05-14 – 2018-05-15 (×2): 80 mg via ORAL
  Filled 2018-05-14 (×2): qty 1

## 2018-05-14 MED ORDER — HEPARIN (PORCINE) IN NACL 100-0.45 UNIT/ML-% IJ SOLN
1000.0000 [IU]/h | INTRAMUSCULAR | Status: DC
  Administered 2018-05-14: 750 [IU]/h via INTRAVENOUS
  Administered 2018-05-15: 1000 [IU]/h via INTRAVENOUS
  Filled 2018-05-14 (×2): qty 250

## 2018-05-14 MED ORDER — NITROGLYCERIN 2 % TD OINT
1.0000 [in_us] | TOPICAL_OINTMENT | Freq: Once | TRANSDERMAL | Status: AC
  Administered 2018-05-14: 1 [in_us] via TOPICAL
  Filled 2018-05-14: qty 1

## 2018-05-14 MED ORDER — ASPIRIN EC 81 MG PO TBEC
325.0000 mg | DELAYED_RELEASE_TABLET | Freq: Every day | ORAL | Status: DC
  Administered 2018-05-15: 324 mg via ORAL
  Filled 2018-05-14: qty 4

## 2018-05-14 MED ORDER — ONDANSETRON HCL 4 MG/2ML IJ SOLN: 4.0000 mg | Freq: Four times a day (QID) | INTRAMUSCULAR | Status: DC | PRN

## 2018-05-14 MED ORDER — NITROGLYCERIN 0.4 MG SL SUBL
0.4000 mg | SUBLINGUAL_TABLET | SUBLINGUAL | Status: DC | PRN
  Filled 2018-05-14: qty 1

## 2018-05-14 MED ORDER — ONDANSETRON HCL 4 MG/2ML IJ SOLN
4.0000 mg | Freq: Once | INTRAMUSCULAR | Status: AC
  Administered 2018-05-14: 4 mg via INTRAVENOUS
  Filled 2018-05-14: qty 2

## 2018-05-14 MED ORDER — ACETAMINOPHEN 325 MG PO TABS: 650.0000 mg | ORAL_TABLET | ORAL | Status: DC | PRN

## 2018-05-14 MED ORDER — FINASTERIDE 5 MG PO TABS
5.0000 mg | ORAL_TABLET | Freq: Every day | ORAL | Status: DC
  Administered 2018-05-15 – 2018-05-21 (×7): 5 mg via ORAL
  Filled 2018-05-14 (×7): qty 1

## 2018-05-14 MED ORDER — METOPROLOL TARTRATE 25 MG PO TABS
25.0000 mg | ORAL_TABLET | Freq: Two times a day (BID) | ORAL | Status: DC
  Administered 2018-05-14 – 2018-05-16 (×4): 25 mg via ORAL
  Filled 2018-05-14 (×4): qty 1

## 2018-05-14 NOTE — ED Notes (Signed)
Report given to floor RN

## 2018-05-14 NOTE — Telephone Encounter (Signed)
New Message:     Patient states he is having shoulder pain

## 2018-05-14 NOTE — Telephone Encounter (Signed)
Patient's daughter, [name] contacted the office because she was concerned about her father's symptoms.  She stated that is started "the day before yesterday" and he was having pain in his back/shoulder/chest and has been so uncomfortable that he has not had an appetite today.  She said that this pain does not stay, but it comes and goes.  She stated that she was told that he had a "clogged" artery in his arm which may be why his arm is feeling this way.  Upon review, it seems that he has subclavian artery stenosis and was mentioned in previous notes.  I advised the patient's daughter that if he was having unbearable chest pain or pain that did not go away, he needed to go to the emergency room and bypass the doctor's office.  However, I did advise her to contact his Cardiologist, Dr. Hazle CocaBerry's office and speak with his nurse about his symptoms.  She acknowledged receipt and stated she would call and get him an appointment.  She stated that she was unsure of who she was to call.  Number and address given to family.

## 2018-05-14 NOTE — H&P (Signed)
Cardiology Admission History and Physical:   Patient ID: Nicholas [name] MRN: [id number]; DOB: 07-Aug-1956   Admission date: 05/14/2018  Primary Care Provider: Anselmo Pickler, MD Primary Cardiologist: Nanetta Batty, MD  Primary Electrophysiologist:  None   Chief Complaint:  Chest pain   Patient Profile:   Nicholas [name] is a 62 y.o. male with PMH significant for HTN, HLD, DM2, s/p 02/2018 CABGx3 (free LIMA-LAD, SVG-Diag, SVG-OM), and h/o asx subclavian artery stenosis (60mm gradient, angio and duplex) presented to [facility] with chest pain x3 days and cardiology consulted for further evaluation and management.  History of Present Illness:   Nicholas [name] is a  62 yo male with PMH as above. Recent 02/2018 NSTEMI with cath showing significant 3v CAD and subsequently underwent 02/2018 CABG x3 with free LIMA-LAD d/t subclavian stenosis, as well as LIMA-LAD, SVG-diag, SVG-OM. Patient also with h/o dyslipidemia on statin with plan to recheck lipid and liver function.  05/08/2018: Last seen in the office by Dr. Maudie Flakes for follow-up after CABG x3 as above in patient profile. Per documentation, patient had some mild chest soreness at his incision site and back soreness at that time. He was walking 20 minutes 3-4 times weekly with recommendation to increase to 20 to 30 minutes for 5 days weekly. It was also documented that he was not completing cardiac rehab at the hospital. 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 Visit documentation notes he was counseled he should not resume heavy / strenuous activity until after 08/2018 but can do some lifting if less than 25 lbs. 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 It was also noted that patient should get repeat CTA to assess severity of subclavian artery stenosis in 6-10 months at that time (Per Dr. Allyson Sabal 03/2018 note, also recommendation for upper extremity doppler, carotid dopplers  around 09/2018) . Per 05/08/18 documentation, the plan was to continue home medications, including asa 325 mg po qd, metformin 500mg  bid po, lopressor 25mg  po bid, lipitor 80mg  qd po and follow-up in 3 months.  05/11/18-05/14/18: Patient reports back pain x3 days that transitioned into atypical CP today 9/17 with associated nausea, causing him to present to [facility] ED. Patient described CP as starting while ambulating, non-pleuritic, rated 3/10 in severity, and a soreness located around his sternal incision site. He stated this CP was different than his previous NSTEMI and less severe. He described some alleviation with rest and ASA. The CP was associated with bilateral shoulder and arm pain, as well as b/l arm weakness. His back pain, which preceeded the CP, reportedly extended from thoracic to lumbar vertebrate. He reportedly decided to come to the ED today and after he started to have nausea and abdominal pain, as well as d/t the urging of his two family members (daughters) present with him in the ED today (05/14/18).  05/14/18 & In the ED, physical exam significant for b/l carotid bruits, L subclavian bruit. Sternum site well healed. Clear lungs.  Vitals significant for HR 60-70, BP 108/70, T 98.5, RR16, SpO2 97% ORA EKG with new lateral tw inversions. 65 bpm, QTc 436.  I- stat Troponin 0.43; glucose 165, WBC 13.5 CXR with no acute changes since CABG 02/2018 Meds received: heparin, IVF, nitro paste, zofran  Patient was seen with Dr. Eden Emms (refer to his note) with plan for cardiac CT tomorrow to assess if grafts patent and ensure healing of sternotomy,  as well as assess Ao root. Plan for scan from clavicles down to look at L subclavian given stenosis. Nursing made aware to start patient with 18 gauge AC in preparation for cardiac CT. Patient already on  blocker - will need  blocker before scan to ensure HR is within range for scan. NPO after midnight. Trend troponins. Recheck EKG in AM. Patient  counseled to take BP In R arm as L arm BP will be lower and not provide an accurate reading d/t L subclavian stenosis. Nursing made aware of this as well.    Past Medical History:  Diagnosis Date  . Diabetes mellitus   . Heart murmur   . Hypertension     Past Surgical History:  Procedure Laterality Date  . CORONARY ARTERY BYPASS GRAFT N/A 03/04/2018   Procedure: CORONARY ARTERY BYPASS GRAFTING (CABG) x 3: -FREE LIMA to LAD, -SVG to DIAGONAL, -SVG to OM; ENDOSCOPIC HARVEST GREATER SAPHENOUS VEIN: -Right Thigh  ;  Surgeon: Kerin Perna, MD;  Location: [facility] OR;  Service: Open Heart Surgery;  Laterality: N/A;  . EYE SURGERY    . INTRAVASCULAR PRESSURE WIRE/FFR STUDY N/A 03/01/2018   Procedure: INTRAVASCULAR PRESSURE WIRE/FFR STUDY;  Surgeon: Swaziland, Peter M, MD;  Location: [facility] INVASIVE CV LAB;  Service: Cardiovascular;  Laterality: N/A;  . LEFT HEART CATH AND CORONARY ANGIOGRAPHY N/A 03/01/2018   Procedure: LEFT HEART CATH AND CORONARY ANGIOGRAPHY;  Surgeon: Swaziland, Peter M, MD;  Location: [facility] INVASIVE CV LAB;  Service: Cardiovascular;  Laterality: N/A;  . TEE WITHOUT CARDIOVERSION N/A 03/04/2018   Procedure: TRANSESOPHAGEAL ECHOCARDIOGRAM (TEE);  Surgeon: Donata Clay, Theron Arista, MD;  Location: [facility] OR;  Service: Open Heart Surgery;  Laterality: N/A;     Medications Prior to Admission: Prior to Admission medications   Medication Sig Start Date End Date Taking? Authorizing Provider  aspirin EC 325 MG EC tablet Take 1 tablet (325 mg total) by mouth daily. 03/09/18  Yes Asa Lente, Tessa N, PA-C  atorvastatin (LIPITOR) 80 MG tablet Take 1 tablet (80 mg total) by mouth daily at 6 PM. 05/08/18  Yes Donata Clay, Theron Arista, MD  finasteride (PROSCAR) 5 MG tablet Take 5 mg by mouth daily.   Yes [provider]  lisinopril (PRINIVIL,ZESTRIL) 2.5 MG tablet Take 2.5 mg by mouth at bedtime. 05/10/18  Yes [provider]  metFORMIN (GLUCOPHAGE) 500 MG tablet Take 500 mg by mouth 2 (two) times daily with a meal.    Yes [provider]  metoprolol tartrate (LOPRESSOR) 25 MG tablet Take 1 tablet (25 mg total) by mouth 2 (two) times daily. 03/09/18  Yes Sharlene Dory, PA-C     Allergies:    Allergies  Allergen Reactions  . Eggs Or Egg-Derived Products Itching and Rash    Social History:   Social History   Socioeconomic History  . Marital status: Married    Spouse name: Not on file  . Number of children: Not on file  . Years of education: Not on file  . Highest education level: Not on file  Occupational History  . Not on file  Social Needs  . Financial resource strain: Not on file  . Food insecurity:    Worry: Not on file    Inability: Not on file  . Transportation needs:    Medical: Not on file    Non-medical: Not on file  Tobacco Use  . Smoking status: Never Smoker  . Smokeless tobacco: Never Used  Substance and Sexual Activity  . Alcohol use:  No  . Drug use: No  . Sexual activity: Not on file  Lifestyle  . Physical activity:    Days per week: Not on file    Minutes per session: Not on file  . Stress: Not on file  Relationships  . Social connections:    Talks on phone: Not on file    Gets together: Not on file    Attends religious service: Not on file    Active member of club or organization: Not on file    Attends meetings of clubs or organizations: Not on file    Relationship status: Not on file  . Intimate partner violence:    Fear of current or ex partner: Not on file    Emotionally abused: Not on file    Physically abused: Not on file    Forced sexual activity: Not on file  Other Topics Concern  . Not on file  Social History Narrative  . Not on file    Family History:   The patient's family history includes Heart disease in his mother.    ROS:  Please see the history of present illness.  All other ROS reviewed and negative.     Physical Exam/Data:   Vitals:   05/14/18 1620 05/14/18 1630 05/14/18 1700 05/14/18 1715  BP:  121/72 106/62 119/63    Pulse:  (!) 58 (!) 53 (!) 54  Resp:  (!) 25 (!) 31 (!) 29  Temp:      TempSrc:      SpO2:  97% 100% 97%  Weight: 64.1 kg     Height: 5\' 2"  (1.575 m)      No intake or output data in the 24 hours ending 05/14/18 1738 Filed Weights   05/14/18 1620  Weight: 64.1 kg   Body mass index is 25.85 kg/m.  General:  Well nourished yet smaller, well developed, in no acute distress HEENT: normal Lymph: no adenopathy Neck: no JVD. B/l Carotid bruit, L subclavian bruit d/t stenosis Endocrine:  No thryomegaly Vascular: + b/l carotid bruits , BP lower in L arm. (take BP in R arm) Cardiac:  normal S1, S2; RRR; no murmur  Lungs:  clear to auscultation bilaterally, no wheezing, rhonchi or rales  Abd: soft, nontender, no hepatomegaly  Ext: no edema Musculoskeletal:  No deformities, BUE and BLE strength reduced but equal  Skin: warm and dry - sternum with incision site s/p sternotomy. Well healed.  Neuro:  CNs 2-12 intact, no focal abnormalities noted Psych:  Normal affect    EKG:  As above in HPI - new lateral Tw inversion  Relevant CV Studies:  03/04/2018 TEE  Aortic valve: The valve is trileaflet. No stenosis. No regurgitation.  Mitral valve: Mild regurgitation.  Right ventricle: Normal cavity size, wall thickness and ejection fraction.  Tricuspid valve: Trace regurgitation. The tricuspid valve regurgitation jet is central.   03/01/18 Vas [place] pre cabg - Carotid and arms/legs Final Interpretation: Right Carotid: Velocities in the right ICA are consistent with a 60-79%        stenosis. The ECA appears >50% stenosed.  Left Carotid: Velocities in the left ICA are consistent with a 40-59% stenosis. Vertebrals: Right vertebral artery demonstrates antegrade flow. Left vertebral       artery demonstrates an occlusion. Subclavians: Left subclavian artery was stenotic. Normal flow hemodynamics were       seen in the right subclavian artery. Right ABI: ABIs are elevated  at rest, suggestive of medial calcification. Pedal waveforms are  within normal limits. Left ABI: ABIs are elevated at rest, suggestive of medial calcification. Pedal waveforms are within normal limits. Right Upper Extremity: Doppler waveforms remain within normal limits with right radial compression. Doppler waveforms remain within normal limits with right ulnar compression.  03/01/18 LHC   Prox LAD lesion is 70% stenosed.  1st Diag lesion is 70% stenosed.  Ramus lesion is 70% stenosed.  Ost 1st Mrg lesion is 95% stenosed.  1st Mrg lesion is 60% stenosed with 60% stenosed side branch in Lat 1st Mrg.  Mid RCA lesion is 100% stenosed.  LV end diastolic pressure is normal.   1. Severe 3 vessel obstructive CAD    - 70% proximal LAD with abnormal FFR of 0.73.    - 95% proximal large OM1    - 100% mid RCA with left to right collaterals. 2. Normal LVEDP 3. 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 4. Severe ostial left vertebral stenosis.  Plan: Recommend CABG for coronary revascularization. The subclavian stenosis is an issue and may compromise flow in the mammary artery long term. I discussed with Dr. Allyson Sabal and he feels the subclavian stenosis can be stented but would require DAPT. Given patient's unstable coronary presentation it may be best to proceed with CABG. There appears to be enough flow to support using the LIMA as a pedicle graft. 3-4 weeks post op the patient could go on DAPT and stent the left subclavian. Will need to get surgical opinion and co-ordinate with them. Dr. Allyson Sabal would be available for the subclavian stent procedure.    02/28/18 TTE Left ventricle: The cavity size was normal. Wall thickness was   normal. Systolic function was normal. The estimated ejection   fraction was in the range of 60% to 65%. Probable hypokinesis of   the basalinferior myocardium. Indeterminate diastolic function. - Mitral valve: There was mild to moderate regurgitation  directed   posteriorly - not well seen. - Right atrium: Central venous pressure (est): 3 mm Hg. - Atrial septum: No defect or patent foramen ovale was identified. - Tricuspid valve: There was trivial regurgitation. - Pulmonary arteries: Systolic pressure could not be accurately   estimated. - Pericardium, extracardiac: There was no pericardial effusion.    Laboratory Data:  Chemistry Recent Labs  Lab 05/14/18 1427  NA 137  K 4.0  CL 102  CO2 21*  GLUCOSE 165*  BUN 12  CREATININE 0.81  CALCIUM 9.3  GFRNONAA >60  GFRAA >60  ANIONGAP 14    No results for input(s): PROT, ALBUMIN, AST, ALT, ALKPHOS, BILITOT in the last 168 hours. Hematology Recent Labs  Lab 05/14/18 1427  WBC 13.5*  RBC 5.01  HGB 13.0  HCT 41.4  MCV 82.6  MCH 25.9*  MCHC 31.4  RDW 14.3  PLT 264   Cardiac EnzymesNo results for input(s): TROPONINI in the last 168 hours.  Recent Labs  Lab 05/14/18 1455  TROPIPOC 0.43*    BNPNo results for input(s): BNP, PROBNP in the last 168 hours.  DDimer No results for input(s): DDIMER in the last 168 hours.  Radiology/Studies:  Dg Chest 2 View  Result Date: 05/14/2018 CLINICAL DATA:  Chest pain for 2 days EXAM: CHEST - 2 VIEW COMPARISON:  April 10, 2018 FINDINGS: Lungs are clear. Heart size and pulmonary vascularity are normal. No adenopathy. Patient is status post coronary artery bypass grafting. There is aortic atherosclerosis. No evident bone lesions. No pneumothorax. IMPRESSION: No edema or consolidation. Status post coronary artery bypass  grafting. There is aortic atherosclerosis. Aortic Atherosclerosis (ICD10-I70.0). Electronically Signed   By: Bretta BangWilliam  Woodruff III M.D.   On: 05/14/2018 15:24    Assessment and Plan:   Chest pain s/p 02/2018 CABGx3 with initial elevated troponin and h/o L subclavian stenosis - Current CP, rated 3/10 and bilateral arm weakness as in HPI. S/p NSTEMI & CABGx3 02/2018 - EKG with new t wave inversions on EKG and istat  troponin 0.43  - Cardiac CT planned for tomorrow 7/18 to assess if grafts patent - will scan from clavicles down to assess L subclavian, as this is known to be stenosed by duplex.  - Atypical chest pain with etiology unknown at this time: L subclavian stenosis v. occluded graft v pain s/p recent sternotomy v non-cardiac etiology.  - Plan: Admit for observation. Continue to cycle troponins. NPO overnight with plan for cardiac CT tomorrow 05/15/18. Nursing made aware to start 18 gauge AC.  blocker prior to scan for favorable HR. EKG recheck in AM. D/t L subclavian stenosis, nursing made aware to check BP in R arm. Continue ASA,  blocker, statin, ACEi, heparin, and nitro as needed. Will need AM CBC as on heparin. Morning BMET to assess renal function and electrolytes.  - Further recommendations and evaluation pending results of scan.   HTN - Continue medical therapy: ace, lopressor - Lopressor to be given prior to cardiac CT as above - Monitor   HLD - Continue medical therapy on statin - Plan for outpatient lipid check, liver function labs as started statin in 7/19  DM2 - SSI - Metformin held d/t contrast exposure - restart after procedure   Severity of Illness: The appropriate patient status for this patient is OBSERVATION. Observation status is judged to be reasonable and necessary in order to provide the required intensity of service to ensure the patient's safety. The patient's presenting symptoms, physical exam findings, and initial radiographic and laboratory data in the context of their medical condition is felt to place them at decreased risk for further clinical deterioration. Furthermore, it is anticipated that the patient will be medically stable for discharge from the hospital within 2 midnights of admission. The following factors support the patient status of observation.   " The patient's presenting symptoms include chest pain, b/l arm weakness, nausea, back pain. " The physical  exam findings include b/l carotid bruits, L subclavian bruit. " The initial radiographic and laboratory data are positive initial troponin.     For questions or updates, please contact [facility] Please consult [email or web address] for contact info under    Signed, Lennon AlstromJacquelyn D Murna Backer, PA-C  05/14/2018 5:38 PM

## 2018-05-14 NOTE — Telephone Encounter (Signed)
SPOKE WITH PT HE STATES TO SPEAK WITH HIS DAUGHTER  [name], Nicholas. S/w daughter she states that pt has shoulder pain that starts in the back on the shoulder blade and radiates around and down to his elbow. She states that this is not happening now but she was wondering what to do. Informed her go to take pt to the ER to be evaluated for this pain, she states that she will take him tight bow. Made a f/u appt with Nicholas [name] 9-30 to discuss the ER visit.

## 2018-05-14 NOTE — ED Provider Notes (Signed)
[facility] EMERGENCY DEPARTMENT Provider Note   CSN: [id number] Arrival date & time: 05/14/18  1414     History   Chief Complaint Chief Complaint  Patient presents with  . Chest Pain    HPI Kendon Sedeno is a 62 y.o. male.  Alexius Hangartner is a 62 y.o. Male history of hypertension, diabetes, and CAD with recent CABG on 03/04/2018, who presents to the emergency department for evaluation of 3 days of pain that starts in his back and radiates forward to his chest into bilateral arms to the level of the elbow.  It is worse with exertion and he first noticed it after going on a 400 500 m walk.  Reports also feeling increasing fatigue with exertion and some mild shortness of breath.  He has not taken anything to try and treat pain, but does report that he is continue taking his daily aspirin as directed and reports he thinks this helps some, he did take this this morning, pt has not tried any nitroglycerin. He had a non-STEMI on 02/27/2018 and underwent cardiac catheterization by Dr. Swaziland 2 days later revealing three-vessel disease. On 03/04/2018 he underwent coronary artery bypass grafting x3.  Has been compliant with medications and follow-up since then.      H  Past Medical History:  Diagnosis Date  . Diabetes mellitus   . Heart murmur   . Hypertension     Patient Active Problem List   Diagnosis Date Noted  . Non-insulin dependent type 2 diabetes mellitus (HCC) 03/27/2018  . Dyslipidemia, goal LDL below 70 03/27/2018  . 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  . Non-ST elevation (NSTEMI) myocardial infarction (HCC) 02/28/2018  . Stricture of artery (HCC) 02/02/2012  . Subclavian arterial stenosis (HCC) 01/19/2012    Past Surgical History:  Procedure Laterality Date  . CORONARY ARTERY BYPASS GRAFT N/A 03/04/2018   Procedure: CORONARY ARTERY BYPASS GRAFTING (CABG) x 3: -FREE LIMA to LAD, -SVG to DIAGONAL, -SVG to OM; ENDOSCOPIC HARVEST GREATER SAPHENOUS VEIN: -Right Thigh  ;   Surgeon: Kerin Perna, MD;  Location: [facility] OR;  Service: Open Heart Surgery;  Laterality: N/A;  . EYE SURGERY    . INTRAVASCULAR PRESSURE WIRE/FFR STUDY N/A 03/01/2018   Procedure: INTRAVASCULAR PRESSURE WIRE/FFR STUDY;  Surgeon: Swaziland, Peter M, MD;  Location: [facility] INVASIVE CV LAB;  Service: Cardiovascular;  Laterality: N/A;  . LEFT HEART CATH AND CORONARY ANGIOGRAPHY N/A 03/01/2018   Procedure: LEFT HEART CATH AND CORONARY ANGIOGRAPHY;  Surgeon: Swaziland, Peter M, MD;  Location: [facility] INVASIVE CV LAB;  Service: Cardiovascular;  Laterality: N/A;  . TEE WITHOUT CARDIOVERSION N/A 03/04/2018   Procedure: TRANSESOPHAGEAL ECHOCARDIOGRAM (TEE);  Surgeon: Donata Clay, Theron Arista, MD;  Location: [facility] OR;  Service: Open Heart Surgery;  Laterality: N/A;        Home Medications    Prior to Admission medications   Medication Sig Start Date End Date Taking? Authorizing Provider  aspirin EC 325 MG EC tablet Take 1 tablet (325 mg total) by mouth daily. 03/09/18   Sharlene Dory, PA-C  atorvastatin (LIPITOR) 80 MG tablet Take 1 tablet (80 mg total) by mouth daily at 6 PM. 05/08/18   Donata Clay, Theron Arista, MD  finasteride (PROSCAR) 5 MG tablet Take 5 mg by mouth daily.    [provider]  metFORMIN (GLUCOPHAGE) 500 MG tablet Take 500 mg by mouth 2 (two) times daily with a meal.    [provider]  metoprolol tartrate (LOPRESSOR) 25 MG tablet Take  1 tablet (25 mg total) by mouth 2 (two) times daily. 03/09/18   Sharlene Dory, PA-C    Family History Family History  Problem Relation Age of Onset  . Heart disease Mother     Social History Social History   Tobacco Use  . Smoking status: Never Smoker  . Smokeless tobacco: Never Used  Substance Use Topics  . Alcohol use: No  . Drug use: No     Allergies   Eggs or egg-derived products   Review of Systems Review of Systems  Constitutional: Positive for fatigue. Negative for chills and fever.  HENT: Negative.   Eyes: Negative for visual disturbance.    Respiratory: Positive for shortness of breath. Negative for cough, chest tightness and wheezing.   Cardiovascular: Positive for chest pain. Negative for palpitations and leg swelling.  Gastrointestinal: Positive for nausea. Negative for abdominal pain and vomiting.  Genitourinary: Negative for dysuria and frequency.  Musculoskeletal: Positive for back pain. Negative for neck pain.  Skin: Negative for color change and rash.  Neurological: Negative for dizziness, syncope, weakness, light-headedness and numbness.     Physical Exam Updated Vital Signs BP 108/70 (BP Location: Left Arm)   Pulse 69   Temp 98.5 F (36.9 C) (Oral)   Resp 16   SpO2 97%   Physical Exam  Constitutional: He is oriented to person, place, and time. He appears well-developed and well-nourished.  Non-toxic appearance. He does not appear ill. No distress.  HENT:  Head: Normocephalic and atraumatic.  Eyes: Pupils are equal, round, and reactive to light. EOM are normal. Right eye exhibits no discharge. Left eye exhibits no discharge.  Neck: Normal range of motion. Neck supple.  Cardiovascular: Normal rate, regular rhythm, normal heart sounds and intact distal pulses. Exam reveals no gallop and no friction rub.  No murmur heard. Pulses:      Radial pulses are 2+ on the right side, and 2+ on the left side.       Dorsalis pedis pulses are 2+ on the right side, and 2+ on the left side.       Posterior tibial pulses are 2+ on the right side.  Pulmonary/Chest: Effort normal and breath sounds normal. No respiratory distress.  Respirations equal and unlabored, patient able to speak in full sentences, lungs clear to auscultation bilaterally, no reproducible chest tenderness on exam  Abdominal: Soft. Bowel sounds are normal. He exhibits no distension and no mass. There is no tenderness. There is no guarding.  Abdomen soft, nondistended, nontender to palpation in all quadrants without guarding or peritoneal signs   Musculoskeletal:       Right lower leg: Normal. He exhibits no tenderness and no edema.       Left lower leg: Normal. He exhibits no tenderness and no edema.  No reproducible tenderness over back, moving all extremities without difficulty  Neurological: He is alert and oriented to person, place, and time. Coordination normal.  Skin: Skin is warm and dry. Capillary refill takes less than 2 seconds. He is not diaphoretic.  Psychiatric: He has a normal mood and affect. His behavior is normal.  Nursing note and vitals reviewed.    ED Treatments / Results  Labs (all labs ordered are listed, but only abnormal results are displayed) Labs Reviewed  BASIC METABOLIC PANEL - Abnormal; Notable for the following components:      Result Value   CO2 21 (*)    Glucose, Bld 165 (*)    All other components within  normal limits  CBC - Abnormal; Notable for the following components:   WBC 13.5 (*)    MCH 25.9 (*)    All other components within normal limits  I-STAT TROPONIN, ED - Abnormal; Notable for the following components:   Troponin i, poc 0.43 (*)    All other components within normal limits    EKG EKG Interpretation  Date/Time:  Tuesday May 14 2018 14:24:27 EDT Ventricular Rate:  65 PR Interval:  124 QRS Duration: 94 QT Interval:  420 QTC Calculation: 436 R Axis:   14 Text Interpretation:  Normal sinus rhythm ST & T wave abnormality, consider inferolateral ischemia Abnormal ECG Confirmed by Raeford Razor [phone]) on 05/14/2018 3:54:21 PM   Radiology Dg Chest 2 View  Result Date: 05/14/2018 CLINICAL DATA:  Chest pain for 2 days EXAM: CHEST - 2 VIEW COMPARISON:  April 10, 2018 FINDINGS: Lungs are clear. Heart size and pulmonary vascularity are normal. No adenopathy. Patient is status post coronary artery bypass grafting. There is aortic atherosclerosis. No evident bone lesions. No pneumothorax. IMPRESSION: No edema or consolidation. Status post coronary artery bypass grafting.  There is aortic atherosclerosis. Aortic Atherosclerosis (ICD10-I70.0). Electronically Signed   By: Bretta Bang III M.D.   On: 05/14/2018 15:24    Procedures .Critical Care Performed by: Dartha Lodge, PA-C Authorized by: Dartha Lodge, PA-C   Critical care provider statement:    Critical care time (minutes):  45   Critical care time was exclusive of:  Separately billable procedures and treating other patients   Critical care was necessary to treat or prevent imminent or life-threatening deterioration of the following conditions:  Cardiac failure and circulatory failure   Critical care was time spent personally by me on the following activities:  Development of treatment plan with patient or surrogate, discussions with consultants, evaluation of patient's response to treatment, examination of patient, obtaining history from patient or surrogate, review of old charts, re-evaluation of patient's condition, pulse oximetry, ordering and review of radiographic studies, ordering and review of laboratory studies and ordering and performing treatments and interventions   I assumed direction of critical care for this patient from another provider in my specialty: no     (including critical care time)  Medications Ordered in ED Medications  heparin ADULT infusion 100 units/mL (25000 units/269mL sodium chloride 0.45%) (750 Units/hr Intravenous New Bag/Given 05/14/18 1650)  nitroGLYCERIN (NITROGLYN) 2 % ointment 1 inch (1 inch Topical Given 05/14/18 1645)  ondansetron (ZOFRAN) injection 4 mg (4 mg Intravenous Given 05/14/18 1646)  heparin bolus via infusion 3,800 Units (3,800 Units Intravenous Bolus from Bag 05/14/18 1652)     Initial Impression / Assessment and Plan / ED Course  I have reviewed the triage vital signs and the nursing notes.  Pertinent labs & imaging results that were available during my care of the patient were reviewed by me and considered in my medical decision making (see  chart for details).  Presents with 3 days of persistent exertional back pain and chest pain.  Currently asymptomatic.  Recent CABG after patient presented with an STEMI and was found to have three-vessel disease in July.  Followed by Dr. Allyson Sabal and Dr. Maren Beach.  Story is very much concerning for ACS.  EKG obtained in triage which shows some ST and T wave abnormalities.  Cardiac work-up initiated from triage significant for troponin of 0.43, mild leukocytosis of 13.5, normal hemoglobin, no acute electrolyte derangements aside from slightly elevated glucose at 165.  Chest x-ray shows  post CABG changes but no acute cardiopulmonary disease, no abnormalities in aorta or mediastinal silhoutte to suggest dissection. Pt has been compliant with meds and took 325 ASA around 12, prior to arrival.  Given positive troponin and EKG changes with patient's story of exertional chest pain concerning for NSTEMI, discussed with cardiology who will see and evaluate the patient.  Will start patient on Nitropaste to see if this improves his pain, and he has been initiated on heparin drip.  Cardiology plans to admit patient, cardiac CT and trending trops overnight.  Final Clinical Impressions(s) / ED Diagnoses   Final diagnoses:  NSTEMI (non-ST elevated myocardial infarction) [facility](HCC)    ED Discharge Orders    None       Legrand RamsFord, Diante Barley N, PA-C 05/14/18 1710    Raeford RazorKohut, Stephen, MD 05/23/18 [phone]

## 2018-05-14 NOTE — Plan of Care (Signed)
  Problem: Education: Goal: Knowledge of General Education information will improve Description Including pain rating scale, medication(s)/side effects and non-pharmacologic comfort measures Outcome: Completed/Met   Problem: Clinical Measurements: Goal: Respiratory complications will improve Outcome: Completed/Met

## 2018-05-14 NOTE — ED Provider Notes (Signed)
MSE was initiated and I personally evaluated the patient and placed orders (if any) at  2:34 PM on May 14, 2018.  The patient appears stable so that the remainder of the MSE may be completed by another provider.  Patient placed in Quick Look pathway, seen and evaluated   Chief Complaint: chest pain  HPI:   62yo male with pmh of HTN, HLD, DM, s/p CABG on 02/2018 presents to ED for evaluation of 5 day history of bilateral shoulder pain that will radiate to chest. Describes pain beginning in back and radiating up to shoulders and chest. Reports chest pain worse with ambulation. He takes daily ASA which he believes helps with the pain. Denies any injuries or falls. Denies pleuritic chest pain, history of PE.  ROS: chest pain (one)  Physical Exam:   Gen: No distress  Neuro: Awake and Alert  Skin: Warm    Focused Exam: Lungs CTAB. RRR. No weakness noted in BUE. Full ROM of bilateral shoulders. No C, T, L spine TTP.   Initiation of care has begun. The patient has been counseled on the process, plan, and necessity for staying for the completion/evaluation, and the remainder of the medical screening examination    Dietrich PatesKhatri, Elyan Vanwieren, PA-C 05/14/18 1437    Pricilla LovelessGoldston, Scott, MD 05/14/18 [phone]

## 2018-05-14 NOTE — Progress Notes (Signed)
See full note from PA Patient examined chart reviewed  Discussed care with PA, patient and two daughters Exam with slight Bangladeshindian male. Bilateral carotid bruits And left subclavian bruit  Sternum well healed Clear lungs Post endoscopic vein stripping Left with incision on right but vein intact too small for conduit  Pain is atypical but has new lateral T wave inversions on ECG And istat troponin is .43   CABG 7//19 used free lima due to left subclavian stenosis and SVG to OM and D1 RCA not grafted   Will trend troponins and check ECG in am  Would be unusual for graft failure this early  CXR is normal including aortic silhouette and mediastinum   Favor cardiac CTA in am Give normal lopressor dose CT will be able tell if grafts patent, ensure healing of sternotomy and  Assess aortic root. Will scan from clavicles down to look at left subcavian As well since this is known to be stenosed by duplex  Take BP in right arm   Charlton HawsPeter Nishan

## 2018-05-14 NOTE — ED Triage Notes (Signed)
Pt arrives to ED with c/o of upper back pain and bilateral arm that radiates into chest. Pt had recent bypass surgery in June.

## 2018-05-14 NOTE — Progress Notes (Signed)
ANTICOAGULATION CONSULT NOTE - Initial Consult  Pharmacy Consult for heparin Indication: chest pain/ACS  Allergies  Allergen Reactions  . Eggs Or Egg-Derived Products Itching and Rash    Patient Measurements:   Heparin Dosing Weight: 64.1  Vital Signs: Temp: 98.5 F (36.9 C) (09/17 1423) Temp Source: Oral (09/17 1423) BP: 118/72 (09/17 1611) Pulse Rate: 53 (09/17 1611)  Labs: Recent Labs    05/14/18 1427  HGB 13.0  HCT 41.4  PLT 264  CREATININE 0.81    Estimated Creatinine Clearance: 71.2 mL/min (by C-G formula based on SCr of 0.81 mg/dL).   Medical History: Past Medical History:  Diagnosis Date  . Diabetes mellitus   . Heart murmur   . Hypertension     Assessment: 62yo M with recent CABG on 03/04/18 presented with chest pain and started on heparin for ACS. No bleeding documented.  CBC WNL.  Goal of Therapy:  Heparin level 0.3-0.7 units/ml Monitor platelets by anticoagulation protocol: Yes   Plan:  Heparin 3800 units IV bolus, then 750 units/hr IV infusion 6 hr heparin level Daily CBC, heparin level, monitor for s/sx bleeding  Ewing Schleinolton Whiteside, PharmD PGY1 Pharmacy Resident 05/14/2018    5:13 PM

## 2018-05-15 ENCOUNTER — Inpatient Hospital Stay (HOSPITAL_COMMUNITY): Payer: BLUE CROSS/BLUE SHIELD

## 2018-05-15 DIAGNOSIS — I2510 Atherosclerotic heart disease of native coronary artery without angina pectoris: Secondary | ICD-10-CM | POA: Diagnosis not present

## 2018-05-15 DIAGNOSIS — Z7982 Long term (current) use of aspirin: Secondary | ICD-10-CM | POA: Diagnosis not present

## 2018-05-15 DIAGNOSIS — I255 Ischemic cardiomyopathy: Secondary | ICD-10-CM | POA: Diagnosis present

## 2018-05-15 DIAGNOSIS — I200 Unstable angina: Secondary | ICD-10-CM | POA: Diagnosis not present

## 2018-05-15 DIAGNOSIS — I252 Old myocardial infarction: Secondary | ICD-10-CM | POA: Diagnosis not present

## 2018-05-15 DIAGNOSIS — R079 Chest pain, unspecified: Secondary | ICD-10-CM | POA: Diagnosis not present

## 2018-05-15 DIAGNOSIS — Z91012 Allergy to eggs: Secondary | ICD-10-CM | POA: Diagnosis not present

## 2018-05-15 DIAGNOSIS — I214 Non-ST elevation (NSTEMI) myocardial infarction: Secondary | ICD-10-CM | POA: Diagnosis present

## 2018-05-15 DIAGNOSIS — I10 Essential (primary) hypertension: Secondary | ICD-10-CM | POA: Diagnosis present

## 2018-05-15 DIAGNOSIS — I495 Sick sinus syndrome: Secondary | ICD-10-CM | POA: Diagnosis present

## 2018-05-15 DIAGNOSIS — Z951 Presence of aortocoronary bypass graft: Secondary | ICD-10-CM | POA: Diagnosis not present

## 2018-05-15 DIAGNOSIS — Z79899 Other long term (current) drug therapy: Secondary | ICD-10-CM | POA: Diagnosis not present

## 2018-05-15 DIAGNOSIS — I25110 Atherosclerotic heart disease of native coronary artery with unstable angina pectoris: Secondary | ICD-10-CM | POA: Diagnosis present

## 2018-05-15 DIAGNOSIS — I081 Rheumatic disorders of both mitral and tricuspid valves: Secondary | ICD-10-CM | POA: Diagnosis present

## 2018-05-15 DIAGNOSIS — M25512 Pain in left shoulder: Secondary | ICD-10-CM | POA: Diagnosis present

## 2018-05-15 DIAGNOSIS — Z7984 Long term (current) use of oral hypoglycemic drugs: Secondary | ICD-10-CM | POA: Diagnosis not present

## 2018-05-15 DIAGNOSIS — E785 Hyperlipidemia, unspecified: Secondary | ICD-10-CM | POA: Diagnosis present

## 2018-05-15 DIAGNOSIS — E119 Type 2 diabetes mellitus without complications: Secondary | ICD-10-CM | POA: Diagnosis present

## 2018-05-15 DIAGNOSIS — Z8249 Family history of ischemic heart disease and other diseases of the circulatory system: Secondary | ICD-10-CM | POA: Diagnosis not present

## 2018-05-15 DIAGNOSIS — I25700 Atherosclerosis of coronary artery bypass graft(s), unspecified, with unstable angina pectoris: Secondary | ICD-10-CM | POA: Diagnosis present

## 2018-05-15 LAB — BASIC METABOLIC PANEL
ANION GAP: 10 (ref 5–15)
Anion gap: 10 (ref 5–15)
BUN: 9 mg/dL (ref 8–23)
BUN: 8 mg/dL (ref 8–23)
CO2: 23 mmol/L (ref 22–32)
CO2: 21 mmol/L — AB (ref 22–32)
CREATININE: 0.74 mg/dL (ref 0.61–1.24)
Calcium: 8.7 mg/dL — ABNORMAL LOW (ref 8.9–10.3)
Calcium: 8.7 mg/dL — ABNORMAL LOW (ref 8.9–10.3)
Chloride: 105 mmol/L (ref 98–111)
Chloride: 104 mmol/L (ref 98–111)
Creatinine, Ser: 0.78 mg/dL (ref 0.61–1.24)
GFR calc Af Amer: >60 mL/min (ref >60)
GFR calc Af Amer: >60 mL/min (ref >60)
GFR calc non Af Amer: >60 mL/min (ref >60)
GLUCOSE: 210 mg/dL — AB (ref 70–99)
Glucose, Bld: 168 mg/dL — ABNORMAL HIGH (ref 70–99)
Potassium: 4.2 mmol/L (ref 3.5–5.1)
Potassium: 3.9 mmol/L (ref 3.5–5.1)
SODIUM: 138 mmol/L (ref 135–145)
Sodium: 135 mmol/L (ref 135–145)

## 2018-05-15 LAB — GLUCOSE, CAPILLARY
GLUCOSE-CAPILLARY: 159 mg/dL — AB (ref 70–99)
Glucose-Capillary: 192 mg/dL — ABNORMAL HIGH (ref 70–99)
Glucose-Capillary: 275 mg/dL — ABNORMAL HIGH (ref 70–99)

## 2018-05-15 LAB — HEPARIN LEVEL (UNFRACTIONATED)
Heparin Unfractionated: 0.23 IU/mL — ABNORMAL LOW (ref 0.30–0.70)
Heparin Unfractionated: 0.27 IU/mL — ABNORMAL LOW (ref 0.30–0.70)
Heparin Unfractionated: 0.39 IU/mL (ref 0.30–0.70)

## 2018-05-15 LAB — CBC
HEMATOCRIT: 37.4 % — AB (ref 39.0–52.0)
HEMOGLOBIN: 11.7 g/dL — AB (ref 13.0–17.0)
MCH: 25.7 pg — ABNORMAL LOW (ref 26.0–34.0)
MCHC: 31.3 g/dL (ref 30.0–36.0)
MCV: 82.2 fL (ref 78.0–100.0)
Platelets: 224 10*3/uL (ref 150–400)
RBC: 4.55 MIL/uL (ref 4.22–5.81)
RDW: 14.5 % (ref 11.5–15.5)
WBC: 9.4 10*3/uL (ref 4.0–10.5)

## 2018-05-15 LAB — TROPONIN I
TROPONIN I: 3.08 ng/mL — AB (ref <0.03)
Troponin I: 2.8 ng/mL (ref <0.03)

## 2018-05-15 MED ORDER — SODIUM CHLORIDE 0.9% FLUSH: 3.0000 mL | INTRAVENOUS | Status: DC | PRN

## 2018-05-15 MED ORDER — SODIUM CHLORIDE 0.9 % WEIGHT BASED INFUSION
1.0000 mL/kg/h | INTRAVENOUS | Status: DC
  Administered 2018-05-16: 1 mL/kg/h via INTRAVENOUS

## 2018-05-15 MED ORDER — NITROGLYCERIN 0.4 MG SL SUBL
SUBLINGUAL_TABLET | SUBLINGUAL | Status: AC
  Filled 2018-05-15: qty 2

## 2018-05-15 MED ORDER — DIPHENHYDRAMINE HCL 25 MG PO CAPS
50.0000 mg | ORAL_CAPSULE | Freq: Once | ORAL | Status: AC
  Administered 2018-05-16: 50 mg via ORAL
  Filled 2018-05-15: qty 2

## 2018-05-15 MED ORDER — SODIUM CHLORIDE 0.9 % WEIGHT BASED INFUSION
3.0000 mL/kg/h | INTRAVENOUS | Status: DC
  Administered 2018-05-16: 3 mL/kg/h via INTRAVENOUS

## 2018-05-15 MED ORDER — MAGNESIUM HYDROXIDE 400 MG/5ML PO SUSP
30.0000 mL | Freq: Every day | ORAL | Status: DC | PRN
  Administered 2018-05-15 – 2018-05-19 (×3): 30 mL via ORAL
  Filled 2018-05-15 (×3): qty 30

## 2018-05-15 MED ORDER — IOPAMIDOL (ISOVUE-370) INJECTION 76%
100.0000 mL | Freq: Once | INTRAVENOUS | Status: AC | PRN
  Administered 2018-05-15: 80 mL via INTRAVENOUS

## 2018-05-15 MED ORDER — SODIUM CHLORIDE 0.9% FLUSH
3.0000 mL | Freq: Two times a day (BID) | INTRAVENOUS | Status: DC
  Administered 2018-05-15: 3 mL via INTRAVENOUS

## 2018-05-15 MED ORDER — ASPIRIN 81 MG PO CHEW
81.0000 mg | CHEWABLE_TABLET | ORAL | Status: AC
  Administered 2018-05-16: 81 mg via ORAL
  Filled 2018-05-15: qty 1

## 2018-05-15 MED ORDER — DIPHENHYDRAMINE HCL 50 MG/ML IJ SOLN: 50.0000 mg | Freq: Once | INTRAMUSCULAR | Status: AC

## 2018-05-15 MED ORDER — SODIUM CHLORIDE 0.9 % IV SOLN: 250.0000 mL | INTRAVENOUS | Status: DC | PRN

## 2018-05-15 MED ORDER — PREDNISONE 20 MG PO TABS
50.0000 mg | ORAL_TABLET | Freq: Four times a day (QID) | ORAL | Status: AC
  Administered 2018-05-15 – 2018-05-16 (×3): 50 mg via ORAL
  Filled 2018-05-15 (×3): qty 1

## 2018-05-15 MED ORDER — NITROGLYCERIN 0.4 MG SL SUBL
0.4000 mg | SUBLINGUAL_TABLET | Freq: Once | SUBLINGUAL | Status: AC
  Administered 2018-05-15: 0.4 mg via SUBLINGUAL

## 2018-05-15 NOTE — Progress Notes (Signed)
Reported by tele that he had 2.88 seconds pause.  Pt stated he did not feel anything.  Hinton DyerYoko Murry Diaz, RN

## 2018-05-15 NOTE — Progress Notes (Signed)
ANTICOAGULATION CONSULT NOTE   Pharmacy Consult for Heparin Indication: chest pain/ACS  Allergies  Allergen Reactions  . Eggs Or Egg-Derived Products Itching and Rash    Patient Measurements: Height: 5\' 2"  (157.5 cm) Weight: 138 lb 3.2 oz (62.7 kg) IBW/kg (Calculated) : 54.6 Heparin Dosing Weight: 64.1  Vital Signs: Temp: 98.4 F (36.9 C) (09/18 0450) Temp Source: Oral (09/18 0450) BP: 100/66 (09/18 0450) Pulse Rate: 60 (09/18 0450)  Labs: Recent Labs    05/14/18 1427 05/14/18 1846 05/15/18 0058 05/15/18 0628  HGB 13.0 12.2*  --  11.7*  HCT 41.4 38.6*  --  37.4*  PLT 264 249  --  224  HEPARINUNFRC  --   --  0.23* 0.27*  CREATININE 0.81 0.79  --  0.78  TROPONINI  --  2.39* 2.80* 3.08*    Estimated Creatinine Clearance: 72 mL/min (by C-G formula based on SCr of 0.78 mg/dL).  Assessment: 62yo M with recent CABG on 03/04/18 presented with chest pain and started on heparin for ACS.   Heparin level this morning remains slightly SUBtherapeutic however was drawn 2 hours early (HL 0.27 << 0.24, goal of 0.3-0.7). Hgb 11.7 << 12.2, plts wnl. No bleeding noted at this time.   Will not be overly aggressive with the rate change given early level. The patient was previously therapeutic at a rate of 950 units/hr in July '19.  Goal of Therapy:  Heparin level 0.3-0.7 units/ml Monitor platelets by anticoagulation protocol: Yes   Plan:  - Increase Heparin to 1000 units/hr (10 ml/hr) - Will continue to monitor for any signs/symptoms of bleeding and will follow up with heparin level in 6 hours   Thank you for allowing pharmacy to be a part of this patient's care.  Georgina PillionElizabeth Zavier Canela, PharmD, BCPS Clinical Pharmacist Pager: [phone] Clinical phone for 05/15/2018 from 7a-3:30p: [phone] If after 3:30p, please call main pharmacy at: [phone] Please check AM[facility] for all [facility] 05/15/2018 8:56 AM

## 2018-05-15 NOTE — Progress Notes (Signed)
ANTICOAGULATION CONSULT NOTE  Pharmacy Consult for heparin Indication: chest pain/ACS  Allergies  Allergen Reactions  . Eggs Or Egg-Derived Products Itching and Rash    Patient Measurements: Height: 5\' 2"  (157.5 cm) Weight: 140 lb (63.5 kg) IBW/kg (Calculated) : 54.6 Heparin Dosing Weight: 64.1  Vital Signs: Temp: 98.3 F (36.8 C) (09/17 1936) Temp Source: Oral (09/17 1936) BP: 106/61 (09/17 1936) Pulse Rate: 53 (09/17 1936)  Labs: Recent Labs    05/14/18 1427 05/14/18 1846 05/15/18 0058  HGB 13.0 12.2*  --   HCT 41.4 38.6*  --   PLT 264 249  --   HEPARINUNFRC  --   --  0.23*  CREATININE 0.81 0.79  --   TROPONINI  --  2.39*  --     Estimated Creatinine Clearance: 72 mL/min (by C-G formula based on SCr of 0.79 mg/dL).  Assessment: 62 y.o. male with chest pain for heparin   Goal of Therapy:  Heparin level 0.3-0.7 units/ml Monitor platelets by anticoagulation protocol: Yes   Plan:  Increase Heparin 900 units/hr Follow-up am labs.   Nicholas [name], PharmD, BCPS  05/15/2018    1:54 AM

## 2018-05-15 NOTE — Progress Notes (Signed)
ANTICOAGULATION CONSULT NOTE   Pharmacy Consult for Heparin Indication: chest pain/ACS  Allergies  Allergen Reactions  . Eggs Or Egg-Derived Products Itching and Rash    Patient Measurements: Height: 5\' 2"  (157.5 cm) Weight: 138 lb 3.2 oz (62.7 kg) IBW/kg (Calculated) : 54.6 Heparin Dosing Weight: 64.1  Vital Signs: Temp: 97.8 F (36.6 C) (09/18 1517) Temp Source: Oral (09/18 1517) BP: 112/62 (09/18 1517) Pulse Rate: 61 (09/18 1517)  Labs: Recent Labs    05/14/18 1427 05/14/18 1846 05/15/18 0058 05/15/18 0628 05/15/18 1425  HGB 13.0 12.2*  --  11.7*  --   HCT 41.4 38.6*  --  37.4*  --   PLT 264 249  --  224  --   HEPARINUNFRC  --   --  0.23* 0.27* 0.39  CREATININE 0.81 0.79  --  0.78 0.74  TROPONINI  --  2.39* 2.80* 3.08*  --     Estimated Creatinine Clearance: 72 mL/min (by C-G formula based on SCr of 0.74 mg/dL).  Assessment: 62yo M with recent CABG on 03/04/18 presented with chest pain and started on heparin for ACS.  Heparin level now therapeutic; no bleeding reported.   Goal of Therapy:  Heparin level 0.3-0.7 units/ml Monitor platelets by anticoagulation protocol: Yes    Plan:  Continue heparin gtt at 1000 units/hr F/U AM labs   Janace Decker D. Laney Potashang, PharmD, BCPS, BCCCP 05/15/2018, 3:49 PM

## 2018-05-15 NOTE — Plan of Care (Signed)
  Problem: Activity: Goal: Risk for activity intolerance will decrease Outcome: Completed/Met

## 2018-05-15 NOTE — Progress Notes (Addendum)
Progress Note  Patient Name: Nicholas [name] Date of Encounter: 05/15/2018  Primary Cardiologist: Nanetta Batty, MD   Subjective   Pt feeling well this AM. Denies chest pain or SOB. Anticipating cardiac CTA this AM. Trop trending up.  Inpatient Medications    Scheduled Meds: . aspirin EC  325 mg Oral Daily  . atorvastatin  80 mg Oral q1800  . finasteride  5 mg Oral Daily  . insulin aspart  0-9 Units Subcutaneous TID WC  . metoprolol tartrate  25 mg Oral BID   Continuous Infusions: . heparin 900 Units/hr (05/15/18 0641)   PRN Meds: acetaminophen, nitroGLYCERIN, ondansetron (ZOFRAN) IV   Vital Signs    Vitals:   05/14/18 1745 05/14/18 1834 05/14/18 1936 05/15/18 0450  BP: 115/64 117/62 106/61 100/66  Pulse: (!) 58 63 (!) 53 60  Resp: (!) 28  18 16   Temp:  98.3 F (36.8 C) 98.3 F (36.8 C) 98.4 F (36.9 C)  TempSrc:  Oral Oral Oral  SpO2: 97% 97% 97% 97%  Weight:  63.5 kg  62.7 kg  Height:  5\' 2"  (1.575 m)      Intake/Output Summary (Last 24 hours) at 05/15/2018 0752 Last data filed at 05/15/2018 0641 Gross per 24 hour  Intake 148.11 ml  Output -  Net 148.11 ml   Filed Weights   05/14/18 1620 05/14/18 1834 05/15/18 0450  Weight: 64.1 kg 63.5 kg 62.7 kg    Physical Exam   General: Well developed, well nourished, NAD Skin: Warm, dry, intact  Head: Normocephalic, atraumatic, clear, moist mucus membranes. Neck: Negative for carotid bruits. No JVD Lungs:Clear to ausculation bilaterally. No wheezes, rales, or rhonchi. Breathing is unlabored. Cardiovascular: RRR with S1 S2. No murmurs, rubs, gallops, or LV heave appreciated. Abdomen: Soft, non-tender, non-distended with normoactive bowel sounds. No obvious abdominal masses. MSK: Strength and tone appear normal for age. 5/5 in all extremities Extremities: No edema. No clubbing or cyanosis. DP/PT pulses 2+ bilaterally Neuro: Alert and oriented. No focal deficits. No facial asymmetry. MAE spontaneously. Psych:  Responds to questions appropriately with normal affect.    Labs    Chemistry Recent Labs  Lab 05/14/18 1427 05/14/18 1846  NA 137  --   K 4.0  --   CL 102  --   CO2 21*  --   GLUCOSE 165*  --   BUN 12  --   CREATININE 0.81 0.79  CALCIUM 9.3  --   GFRNONAA >60 >60  GFRAA >60 >60  ANIONGAP 14  --      Hematology Recent Labs  Lab 05/14/18 1427 05/14/18 1846 05/15/18 0628  WBC 13.5* 12.5* 9.4  RBC 5.01 4.71 4.55  HGB 13.0 12.2* 11.7*  HCT 41.4 38.6* 37.4*  MCV 82.6 82.0 82.2  MCH 25.9* 25.9* 25.7*  MCHC 31.4 31.6 31.3  RDW 14.3 14.3 14.5  PLT 264 249 224    Cardiac Enzymes Recent Labs  Lab 05/14/18 1846 05/15/18 0058  TROPONINI 2.39* 2.80*    Recent Labs  Lab 05/14/18 1455  TROPIPOC 0.43*     BNPNo results for input(s): BNP, PROBNP in the last 168 hours.   DDimer No results for input(s): DDIMER in the last 168 hours.   Radiology    Dg Chest 2 View  Result Date: 05/14/2018 CLINICAL DATA:  Chest pain for 2 days EXAM: CHEST - 2 VIEW COMPARISON:  April 10, 2018 FINDINGS: Lungs are clear. Heart size and pulmonary vascularity are normal. No adenopathy. Patient  is status post coronary artery bypass grafting. There is aortic atherosclerosis. No evident bone lesions. No pneumothorax. IMPRESSION: No edema or consolidation. Status post coronary artery bypass grafting. There is aortic atherosclerosis. Aortic Atherosclerosis (ICD10-I70.0). Electronically Signed   By: Bretta BangWilliam  Woodruff III M.D.   On: 05/14/2018 15:24   Telemetry    05/15/18 SB HR 50-60's - Personally Reviewed  ECG   No new tracings as of 05/15/2018 - Personally Reviewed  Cardiac Studies   03/04/2018 TEE:  Aortic valve: The valve is trileaflet. No stenosis. No regurgitation.  Mitral valve: Mild regurgitation.  Right ventricle: Normal cavity size, wall thickness and ejection fraction.  Tricuspid valve: Trace regurgitation. The tricuspid valve regurgitation jet is central.  03/01/18 LHC:    Prox LAD lesion is 70% stenosed.  1st Diag lesion is 70% stenosed.  Ramus lesion is 70% stenosed.  Ost 1st Mrg lesion is 95% stenosed.  1st Mrg lesion is 60% stenosed with 60% stenosed side branch in Lat 1st Mrg.  Mid RCA lesion is 100% stenosed.  LV end diastolic pressure is normal.  1. Severe 3 vessel obstructive CAD - 70% proximal LAD with abnormal FFR of 0.73. - 95% proximal large OM1 - 100% mid RCA with left to right collaterals. 2. Normal LVEDP 3. 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 4. Severe ostial left vertebral stenosis.  Plan: Recommend CABG for coronary revascularization. The subclavian stenosis is an issue and may compromise flow in the mammary artery long term. I discussed with Dr. Allyson SabalBerry and he feels the subclavian stenosis can be stented but would require DAPT. Given patient's unstable coronary presentation it may be best to proceed with CABG. There appears to be enough flow to support using the LIMA as a pedicle graft. 3-4 weeks post op the patient could go on DAPT and stent the left subclavian. Will need to get surgical opinion and co-ordinate with them. Dr. Allyson SabalBerry would be available for the subclavian stent procedure.   02/28/18 TTE: Left ventricle: The cavity size was normal. Wall thickness was normal. Systolic function was normal. The estimated ejection fraction was in the range of 60% to 65%. Probable hypokinesis of the basalinferior myocardium. Indeterminate diastolic function. - Mitral valve: There was mild to moderate regurgitation directed posteriorly - not well seen. - Right atrium: Central venous pressure (est): 3 mm Hg. - Atrial septum: No defect or patent foramen ovale was identified. - Tricuspid valve: There was trivial regurgitation. - Pulmonary arteries: Systolic pressure could not be accurately estimated. - Pericardium, extracardiac: There was no pericardial effusion.  Patient Profile     62  y.o. male with PMH significant for HTN, HLD, DM2, s/p 02/2018 CABGx3 (free LIMA-LAD, SVG-Diag, SVG-OM), and h/o asx subclavian artery stenosis (60mm gradient, angio and duplex) [facility] with chest pain x3 days and cardiology consulted for further evaluation and management.  Assessment & Plan    1.  Chest pain s/p CABG 02/2018 with elevated troponin and hx of L subclavian stenosis>>> NSTEMI: -Patient presented with chest pain and bilateral arm weakness s/p NSTEMI and CABG times 11/01/2017 -EKG with new T wave inversions -I-STAT troponin, 0.43>> 2.39, 2.80, 3.08>>no anginal symptoms this AM  -Plan for coronary CT this AM to further evaluate>>has been NPO -Continue IV heparin given elevated trop -Further evaluation based on CTA results  -Continue ASA, statin, BB  2.  HTN: -Stable, 100/66>106/61>117/62 -Continue metoprolol 25mg  twice daily  -Metoprolol to be given prior to coronary CT -HR  50-60's   3. HLD: -Stable, last LDL 56 03/2018 -Continue statin -Monitor outpatient lipid panel, LFTs  4.  DM2: -SSI for glucose control while inpatient status -On home metformin, held secondary to coronary CT plan for today -Will restart at discharge  Signed, Georgie Chard NP-C [facility] Pager: [phone] 05/15/2018, 7:52 AM     For questions or updates, please contact   Please consult [email or web address] for contact info under Cardiology/STEMI.  Patient examined chart reviewed Discussed care with patient and daughters Review of CT scan shows 2/3 grafts likely occluded Seems to be one graft going to diagonal. He has pain in his back this am with elevated troponin and lateral ECG changes Favor diagnostic heart cath in am had 100 cc dye today and will need to be premedicated again  Exam with well healed sternum no murmur clear lungs Left subclavian and carotid bruits with pulse deficit in left arm   Charlton Haws

## 2018-05-16 ENCOUNTER — Encounter (HOSPITAL_COMMUNITY)
Admission: EM | Disposition: A | Discharge status: Home / Self Care | Payer: Self-pay | Source: Home / Self Care | Attending: Cardiovascular Disease

## 2018-05-16 DIAGNOSIS — I495 Sick sinus syndrome: Secondary | ICD-10-CM

## 2018-05-16 DIAGNOSIS — I200 Unstable angina: Secondary | ICD-10-CM

## 2018-05-16 DIAGNOSIS — I2510 Atherosclerotic heart disease of native coronary artery without angina pectoris: Secondary | ICD-10-CM

## 2018-05-16 DIAGNOSIS — R079 Chest pain, unspecified: Secondary | ICD-10-CM

## 2018-05-16 HISTORY — PX: LEFT HEART CATH AND CORS/GRAFTS ANGIOGRAPHY: CATH118250

## 2018-05-16 LAB — CBC
HEMATOCRIT: 39.2 % (ref 39.0–52.0)
HEMATOCRIT: 38.1 % — AB (ref 39.0–52.0)
HEMOGLOBIN: 12.5 g/dL — AB (ref 13.0–17.0)
Hemoglobin: 12 g/dL — ABNORMAL LOW (ref 13.0–17.0)
MCH: 25.9 pg — ABNORMAL LOW (ref 26.0–34.0)
MCH: 25.9 pg — AB (ref 26.0–34.0)
MCHC: 31.9 g/dL (ref 30.0–36.0)
MCHC: 31.5 g/dL (ref 30.0–36.0)
MCV: 81.2 fL (ref 78.0–100.0)
MCV: 82.1 fL (ref 78.0–100.0)
Platelets: 216 10*3/uL (ref 150–400)
Platelets: 229 10*3/uL (ref 150–400)
RBC: 4.83 MIL/uL (ref 4.22–5.81)
RBC: 4.64 MIL/uL (ref 4.22–5.81)
RDW: 14.3 % (ref 11.5–15.5)
RDW: 14.4 % (ref 11.5–15.5)
WBC: 8.8 10*3/uL (ref 4.0–10.5)
WBC: 14.3 10*3/uL — AB (ref 4.0–10.5)

## 2018-05-16 LAB — BASIC METABOLIC PANEL
Anion gap: 10 (ref 5–15)
BUN: 10 mg/dL (ref 8–23)
CO2: 21 mmol/L — ABNORMAL LOW (ref 22–32)
Calcium: 8.5 mg/dL — ABNORMAL LOW (ref 8.9–10.3)
Chloride: 103 mmol/L (ref 98–111)
Creatinine, Ser: 0.77 mg/dL (ref 0.61–1.24)
GFR calc Af Amer: >60 mL/min (ref >60)
GLUCOSE: 277 mg/dL — AB (ref 70–99)
Potassium: 4.2 mmol/L (ref 3.5–5.1)
SODIUM: 134 mmol/L — AB (ref 135–145)

## 2018-05-16 LAB — HEPARIN LEVEL (UNFRACTIONATED): HEPARIN UNFRACTIONATED: 0.33 [IU]/mL (ref 0.30–0.70)

## 2018-05-16 LAB — CREATININE, SERUM
Creatinine, Ser: 0.68 mg/dL (ref 0.61–1.24)
GFR calc non Af Amer: >60 mL/min (ref >60)

## 2018-05-16 LAB — POCT ACTIVATED CLOTTING TIME: Activated Clotting Time: 114 seconds

## 2018-05-16 LAB — GLUCOSE, CAPILLARY
GLUCOSE-CAPILLARY: 262 mg/dL — AB (ref 70–99)
Glucose-Capillary: 214 mg/dL — ABNORMAL HIGH (ref 70–99)
Glucose-Capillary: 201 mg/dL — ABNORMAL HIGH (ref 70–99)

## 2018-05-16 SURGERY — LEFT HEART CATH AND CORS/GRAFTS ANGIOGRAPHY
Anesthesia: LOCAL

## 2018-05-16 MED ORDER — ONDANSETRON HCL 4 MG/2ML IJ SOLN: 4.0000 mg | Freq: Four times a day (QID) | INTRAMUSCULAR | Status: DC | PRN

## 2018-05-16 MED ORDER — LIDOCAINE HCL (PF) 1 % IJ SOLN
INTRAMUSCULAR | Status: DC | PRN
  Administered 2018-05-16: 22 mL

## 2018-05-16 MED ORDER — DIAZEPAM 5 MG PO TABS: 5.0000 mg | ORAL_TABLET | Freq: Four times a day (QID) | ORAL | Status: DC | PRN

## 2018-05-16 MED ORDER — HEPARIN (PORCINE) IN NACL 1000-0.9 UT/500ML-% IV SOLN
INTRAVENOUS | Status: DC | PRN
  Administered 2018-05-16 (×2): 500 mL

## 2018-05-16 MED ORDER — ACETAMINOPHEN 325 MG PO TABS
650.0000 mg | ORAL_TABLET | ORAL | Status: DC | PRN
  Filled 2018-05-16: qty 2

## 2018-05-16 MED ORDER — ISOSORBIDE MONONITRATE ER 30 MG PO TB24
30.0000 mg | ORAL_TABLET | Freq: Every day | ORAL | Status: DC
  Administered 2018-05-16 – 2018-05-21 (×6): 30 mg via ORAL
  Filled 2018-05-16 (×6): qty 1

## 2018-05-16 MED ORDER — ASPIRIN 81 MG PO CHEW
81.0000 mg | CHEWABLE_TABLET | Freq: Every day | ORAL | Status: DC
  Administered 2018-05-17 – 2018-05-19 (×3): 81 mg via ORAL
  Filled 2018-05-16 (×3): qty 1

## 2018-05-16 MED ORDER — LIDOCAINE HCL (PF) 1 % IJ SOLN
INTRAMUSCULAR | Status: AC
  Filled 2018-05-16: qty 30

## 2018-05-16 MED ORDER — CLOPIDOGREL BISULFATE 75 MG PO TABS
75.0000 mg | ORAL_TABLET | Freq: Every day | ORAL | Status: DC
  Administered 2018-05-17 – 2018-05-20 (×4): 75 mg via ORAL
  Filled 2018-05-16 (×4): qty 1

## 2018-05-16 MED ORDER — METOPROLOL TARTRATE 12.5 MG HALF TABLET
12.5000 mg | ORAL_TABLET | Freq: Two times a day (BID) | ORAL | Status: DC
  Administered 2018-05-16 – 2018-05-21 (×10): 12.5 mg via ORAL
  Filled 2018-05-16 (×10): qty 1

## 2018-05-16 MED ORDER — SODIUM CHLORIDE 0.9 % IV SOLN
INTRAVENOUS | Status: AC
  Administered 2018-05-16: 19:00:00 via INTRAVENOUS

## 2018-05-16 MED ORDER — HEPARIN (PORCINE) IN NACL 1000-0.9 UT/500ML-% IV SOLN
INTRAVENOUS | Status: AC
  Filled 2018-05-16: qty 1000

## 2018-05-16 MED ORDER — ENOXAPARIN SODIUM 40 MG/0.4ML ~~LOC~~ SOLN: 40.0000 mg | SUBCUTANEOUS | Status: DC

## 2018-05-16 MED ORDER — SODIUM CHLORIDE 0.9% FLUSH
3.0000 mL | Freq: Two times a day (BID) | INTRAVENOUS | Status: DC
  Administered 2018-05-16 – 2018-05-19 (×5): 3 mL via INTRAVENOUS

## 2018-05-16 MED ORDER — MIDAZOLAM HCL 2 MG/2ML IJ SOLN
INTRAMUSCULAR | Status: AC
  Filled 2018-05-16: qty 2

## 2018-05-16 MED ORDER — SODIUM CHLORIDE 0.9 % IV SOLN: 250.0000 mL | INTRAVENOUS | Status: DC | PRN

## 2018-05-16 MED ORDER — ATORVASTATIN CALCIUM 80 MG PO TABS
80.0000 mg | ORAL_TABLET | Freq: Every day | ORAL | Status: DC
  Administered 2018-05-17 – 2018-05-20 (×4): 80 mg via ORAL
  Filled 2018-05-16 (×4): qty 1

## 2018-05-16 MED ORDER — IOHEXOL 350 MG/ML SOLN
INTRAVENOUS | Status: DC | PRN
  Administered 2018-05-16: 135 mL via INTRA_ARTERIAL

## 2018-05-16 MED ORDER — FENTANYL CITRATE (PF) 100 MCG/2ML IJ SOLN
INTRAMUSCULAR | Status: AC
  Filled 2018-05-16: qty 2

## 2018-05-16 MED ORDER — FENTANYL CITRATE (PF) 100 MCG/2ML IJ SOLN
INTRAMUSCULAR | Status: DC | PRN
  Administered 2018-05-16: 25 ug via INTRAVENOUS

## 2018-05-16 MED ORDER — SODIUM CHLORIDE 0.9% FLUSH: 3.0000 mL | INTRAVENOUS | Status: DC | PRN

## 2018-05-16 MED ORDER — MIDAZOLAM HCL 2 MG/2ML IJ SOLN
INTRAMUSCULAR | Status: DC | PRN
  Administered 2018-05-16: 2 mg via INTRAVENOUS

## 2018-05-16 SURGICAL SUPPLY — 9 items
CATH INFINITI 5 FR LCB (CATHETERS) ×1 IMPLANT
CATH INFINITI 5FR AL1 (CATHETERS) ×1 IMPLANT
CATH INFINITI 5FR MULTPACK ANG (CATHETERS) ×1 IMPLANT
KIT HEART LEFT (KITS) ×2 IMPLANT
PACK CARDIAC CATHETERIZATION (CUSTOM PROCEDURE TRAY) ×2 IMPLANT
SHEATH PINNACLE 5F 10CM (SHEATH) ×1 IMPLANT
SYR MEDRAD MARK V 150ML (SYRINGE) ×2 IMPLANT
TRANSDUCER W/STOPCOCK (MISCELLANEOUS) ×2 IMPLANT
WIRE EMERALD 3MM-J .035X150CM (WIRE) ×1 IMPLANT

## 2018-05-16 NOTE — Progress Notes (Signed)
Inpatient Diabetes Program Recommendations  AACE/ADA: New Consensus Statement on Inpatient Glycemic Control (2015)  Target Ranges:  Prepandial:   less than 140 mg/dL      Peak postprandial:   less than 180 mg/dL (1-2 hours)      Critically ill patients:  140 - 180 mg/dL   Lab Results  Component Value Date   GLUCAP 262 (H) 05/16/2018   HGBA1C 7.6 (H) 03/02/2018    Review of Glycemic Control  Diabetes history: DM 2 Outpatient Diabetes medications: Metformin 500 gm BID Current orders for Inpatient glycemic control: Novolog 0-9 units tid  Inpatient Diabetes Program Recommendations:    A1c 7.6% on 7/6  Patient is on on PO prednisone 50 mg Q6 hours. Glucose 260's. Patient for Cardiac cath today. May want to consider increasing Novolog correction scale to 0-15 units tid while on steroids and adding Novolog HS scale.  Thanks,  Christena DeemShannon Mykel Mohl RN, MSN, BC-ADM Inpatient Diabetes Coordinator Team Pager [phone] (8a-5p)

## 2018-05-16 NOTE — Progress Notes (Signed)
ANTICOAGULATION CONSULT NOTE   Pharmacy Consult for Heparin Indication: chest pain/ACS  Allergies  Allergen Reactions  . Eggs Or Egg-Derived Products Itching and Rash    Patient Measurements: Height: 5\' 2"  (157.5 cm) Weight: 136 lb 11 oz (62 kg) IBW/kg (Calculated) : 54.6 Heparin Dosing Weight: 64.1  Vital Signs: Temp: 98.2 F (36.8 C) (09/19 0500) Temp Source: Oral (09/19 0500) BP: 112/68 (09/19 0500) Pulse Rate: 67 (09/19 0500)  Labs: Recent Labs    05/14/18 1846  05/15/18 0058 05/15/18 0628 05/15/18 1425 05/16/18 0449  HGB 12.2*  --   --  11.7*  --  12.5*  HCT 38.6*  --   --  37.4*  --  39.2  PLT 249  --   --  224  --  216  HEPARINUNFRC  --    < > 0.23* 0.27* 0.39 0.33  CREATININE 0.79  --   --  0.78 0.74  --   TROPONINI 2.39*  --  2.80* 3.08*  --   --    < > = values in this interval not displayed.    Estimated Creatinine Clearance: 72 mL/min (by C-G formula based on SCr of 0.74 mg/dL).  Assessment: [age]yo M with recent CABG on 03/04/18 presented with chest pain and started on heparin for ACS. Noted plans for possible cath 9/19 -heparin level at goal   Goal of Therapy:  Heparin level 0.3-0.7 units/ml Monitor platelets by anticoagulation protocol: Yes    Plan:  Continue heparin gtt at 1000 units/hr Will follow post cath  Harland GermanAndrew Pailynn Vahey, PharmD Clinical Pharmacist Please check Amion for pharmacy contact number

## 2018-05-16 NOTE — Plan of Care (Signed)
  Problem: Clinical Measurements: Goal: Will remain free from infection Outcome: Progressing Goal: Diagnostic test results will improve Outcome: Progressing Goal: Cardiovascular complication will be avoided Outcome: Progressing   

## 2018-05-16 NOTE — Progress Notes (Addendum)
Progress Note  Patient Name: Nicholas [name] Date of Encounter: 05/16/2018  Primary Cardiologist: Nanetta Batty, MD  Subjective   Pt feeling well today. Chest pain only with ambulation. Resting comfortably this AM. Occasional 2.0-3.0 pausing on telemetry  Inpatient Medications    Scheduled Meds: . aspirin EC  325 mg Oral Daily  . atorvastatin  80 mg Oral q1800  . diphenhydrAMINE  50 mg Oral Once   Or  . diphenhydrAMINE  50 mg Intravenous Once  . finasteride  5 mg Oral Daily  . insulin aspart  0-9 Units Subcutaneous TID WC  . metoprolol tartrate  25 mg Oral BID  . predniSONE  50 mg Oral Q6H  . sodium chloride flush  3 mL Intravenous Q12H   Continuous Infusions: . sodium chloride    . sodium chloride 1 mL/kg/hr (05/16/18 0519)  . heparin 1,000 Units/hr (05/15/18 1725)   PRN Meds: sodium chloride, acetaminophen, magnesium hydroxide, nitroGLYCERIN, ondansetron (ZOFRAN) IV, sodium chloride flush   Vital Signs    Vitals:   05/15/18 0943 05/15/18 1517 05/15/18 2045 05/16/18 0500  BP: 109/66 112/62 (!) 122/55 112/68  Pulse:  61 60 67  Resp:   16 18  Temp:  97.8 F (36.6 C) 97.6 F (36.4 C) 98.2 F (36.8 C)  TempSrc:  Oral Oral Oral  SpO2:  97% 97% 98%  Weight:    62 kg  Height:        Intake/Output Summary (Last 24 hours) at 05/16/2018 0745 Last data filed at 05/16/2018 0636 Gross per 24 hour  Intake 524.13 ml  Output -  Net 524.13 ml   Filed Weights   05/14/18 1834 05/15/18 0450 05/16/18 0500  Weight: 63.5 kg 62.7 kg 62 kg    Physical Exam   General: Well developed, well nourished, NAD Skin: Warm, dry, intact  Head: Normocephalic, atraumatic,  clear, moist mucus membranes. Neck: Negative for carotid bruits. No JVD Lungs:Clear to ausculation bilaterally. No wheezes, rales, or rhonchi. Breathing is unlabored. Cardiovascular: RRR with S1 S2. No murmurs, rubs, gallops, or LV heave appreciated. Abdomen: Soft, non-tender, non-distended with normoactive  bowel sounds. No obvious abdominal masses. MSK: Strength and tone appear normal for age. 5/5 in all extremities Extremities: No edema. No clubbing or cyanosis. DP/PT pulses 2+ bilaterally Neuro: Alert and oriented. No focal deficits. No facial asymmetry. MAE spontaneously. Psych: Responds to questions appropriately with normal affect.    Labs    Chemistry Recent Labs  Lab 05/14/18 1427 05/14/18 1846 05/15/18 0628 05/15/18 1425  NA 137  --  138 135  K 4.0  --  4.2 3.9  CL 102  --  105 104  CO2 21*  --  23 21*  GLUCOSE 165*  --  168* 210*  BUN 12  --  9 8  CREATININE 0.81 0.79 0.78 0.74  CALCIUM 9.3  --  8.7* 8.7*  GFRNONAA >60 >60 >60 >60  GFRAA >60 >60 >60 >60  ANIONGAP 14  --  10 10     Hematology Recent Labs  Lab 05/14/18 1846 05/15/18 0628 05/16/18 0449  WBC 12.5* 9.4 8.8  RBC 4.71 4.55 4.83  HGB 12.2* 11.7* 12.5*  HCT 38.6* 37.4* 39.2  MCV 82.0 82.2 81.2  MCH 25.9* 25.7* 25.9*  MCHC 31.6 31.3 31.9  RDW 14.3 14.5 14.3  PLT 249 224 216    Cardiac Enzymes Recent Labs  Lab 05/14/18 1846 05/15/18 0058 05/15/18 0628  TROPONINI 2.39* 2.80* 3.08*    Recent Labs  Lab 05/14/18 1455  TROPIPOC 0.43*     BNPNo results for input(s): BNP, PROBNP in the last 168 hours.   DDimer No results for input(s): DDIMER in the last 168 hours.   Radiology    Dg Chest 2 View  Result Date: 05/14/2018 CLINICAL DATA:  Chest pain for 2 days EXAM: CHEST - 2 VIEW COMPARISON:  April 10, 2018 FINDINGS: Lungs are clear. Heart size and pulmonary vascularity are normal. No adenopathy. Patient is status post coronary artery bypass grafting. There is aortic atherosclerosis. No evident bone lesions. No pneumothorax. IMPRESSION: No edema or consolidation. Status post coronary artery bypass grafting. There is aortic atherosclerosis. Aortic Atherosclerosis (ICD10-I70.0). Electronically Signed   By: Bretta BangWilliam  Woodruff III M.D.   On: 05/14/2018 15:24   Ct Coronary Morph W/cta Cor W/score  W/ca W/cm &/or Wo/cm  Addendum Date: 05/15/2018   ADDENDUM REPORT: 05/15/2018 11:26 CLINICAL DATA:  Chest pain EXAM: Cardiac CTA MEDICATIONS: Sub lingual nitro. 4mg  and lopressor 2.5mg  TECHNIQUE: The patient was scanned on a Philips 256 slice scanner. Gantry rotation speed was 270 msecs. Collimation was .9mm. A 100 kV prospective scan was triggered in the descending thoracic aorta at 111 HU's with 5% padding centered around 78% of the R-R interval. Average HR during the scan was bpm. The 3D data set was interpreted on a dedicated work station using MPR, MIP and VRT modes. A total of 80cc of contrast was used. FINDINGS: Non-cardiac: [facility]. No significant findings on limited lung and soft tissue windows. Significant stenosis and soft plaque noted in proximal portion of left subclavian artery Normal aortic root 2.9 cm with good healing of sternum post CABG Coronary Arteries: Right dominant with no anomalies LM: Normal LAD: >70% mixed plaque in proximal vessel IM: > 70% mixed plaque in proximal vessel D1: >70% mixed plaque in mid vessel Circumflex: 50% mixed plaque OM1: >70% mixed plaque RCA: 100% occluded in mid vessel distal vessel would appear to fill from collaterals IMPRESSION: 1. Severe native 3 vessel CAD 2.  Normal healing sternotomy 3.  Normal aortic root 2.9 cm 4. Appears to have pre mature graft failure with only one of 3 grafts patent. Appers to have patent graft to diagonal. Graft to LAD and OM occluded Charlton HawsPeter Gatlyn Lipari Electronically Signed   By: Charlton HawsPeter  Lenni Reckner M.D.   On: 05/15/2018 11:26   Result Date: 05/15/2018 EXAM: OVER-READ INTERPRETATION  CT CHEST The following report is an over-read performed by radiologist Dr. Charlett NoseKevin Dover [facility] on 05/15/2018. This over-read does not include interpretation of cardiac or coronary anatomy or pathology. The coronary CTA interpretation by the cardiologist is attached. COMPARISON:  None. FINDINGS: Vascular:  Heart is normal size. Visualized aorta is normal caliber. Scattered calcifications in the aorta. Prior CABG. Mediastinum/Nodes: No mediastinal, hilar, or axillary adenopathy. Lungs/Pleura: Lungs are clear. No focal airspace opacities or suspicious nodules. No effusions. Upper Abdomen: Imaging into the upper abdomen shows no acute findings. Musculoskeletal: Chest wall soft tissues are unremarkable. No acute bony abnormality. IMPRESSION: No acute or significant extracardiac abnormality.  Prior CABG. Electronically Signed: By: Charlett NoseKevin  Dover M.D. On: 05/15/2018 09:54   Telemetry    05/16/18 NSR with occasional 2.0-3.0 second pauses - Personally Reviewed  ECG    No new tracing as of 05/16/18- Personally Reviewed  Cardiac Studies   03/04/2018 TEE:  Aortic valve: The valve is trileaflet. No stenosis. No regurgitation.  Mitral valve: Mild regurgitation.  Right ventricle: Normal cavity size, wall thickness  and ejection fraction.  Tricuspid valve: Trace regurgitation. The tricuspid valve regurgitation jet is central.  03/01/18 LHC:  Prox LAD lesion is 70% stenosed.  1st Diag lesion is 70% stenosed.  Ramus lesion is 70% stenosed.  Ost 1st Mrg lesion is 95% stenosed.  1st Mrg lesion is 60% stenosed with 60% stenosed side branch in Lat 1st Mrg.  Mid RCA lesion is 100% stenosed.  LV end diastolic pressure is normal.  1. Severe 3 vessel obstructive CAD - 70% proximal LAD with abnormal FFR of 0.73. - 95% proximal large OM1 - 100% mid RCA with left to right collaterals. 2. Normal LVEDP 3. 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 4. Severe ostial left vertebral stenosis.  Plan: Recommend CABG for coronary revascularization. The subclavian stenosis is an issue and may compromise flow in the mammary artery long term. I discussed with Dr. Allyson Sabal and he feels the subclavian stenosis can be stented but would require DAPT. Given patient's unstable coronary  presentation it may be best to proceed with CABG. There appears to be enough flow to support using the LIMA as a pedicle graft. 3-4 weeks post op the patient could go on DAPT and stent the left subclavian. Will need to get surgical opinion and co-ordinate with them. Dr. Allyson Sabal would be available for the subclavian stent procedure.  02/28/18 TTE: Left ventricle: The cavity size was normal. Wall thickness was normal. Systolic function was normal. The estimated ejection fraction was in the range of 60% to 65%. Probable hypokinesis of the basalinferior myocardium. Indeterminate diastolic function. - Mitral valve: There was mild to moderate regurgitation directed posteriorly - not well seen. - Right atrium: Central venous pressure (est): 3 mm Hg. - Atrial septum: No defect or patent foramen ovale was identified. - Tricuspid valve: There was trivial regurgitation. - Pulmonary arteries: Systolic pressure could not be accurately estimated. - Pericardium, extracardiac: There was no pericardial effusion.  Patient Profile     62 y.o. male with PMH significant for HTN, HLD, DM2, s/p 02/2018 CABGx3 (free LIMA-LAD, SVG-Diag, SVG-OM), and h/o asx subclavian artery stenosis (60mm gradient, angio and duplex) presented to [facility] with chest pain x3 days and cardiology consulted for further evaluation and management.  Assessment & Plan    1.  Chest pain s/p CABG 02/2018 with elevated troponin and hx of L subclavian stenosis>>> NSTEMI: -Patient presented with chest pain and bilateral arm weakness s/p NSTEMI and CABG times 11/01/2017 -EKG with new T wave inversions on admission  -I-STAT troponin, 0.43>> 2.39, 2.80, 3.08>>no recurrent anginal symptoms this AM  -Coronary CT with premature graft failure, with only one of three grafts patent>>graft to diagonal>>graft to LAD and OM occluded -Plan is for cardiac catheterization today, 05/16/18 for further evaluation  -Continue IV heparin given  elevated trop -Continue ASA, statin, BB  2. Sinus pausing: -Per tele review, pt with occasional 2.0-3.0 sec pauses>>asymptomatic -Will continue to monitor -Will obtain more current BMET for possible electrolyte disassociation   3.  HTN: -Stable, 112/68>122/55>112/62 -Continue metoprolol 25mg  twice daily  -HR 70's    4. HLD: -Stable, last LDL 56 03/2018 -Continue statin -Monitor outpatient lipid panel, LFTs  5.  DM2: -SSI for glucose control while inpatient status -On home metformin, held secondary to coronary CT plan for today -Will restart at discharge   Signed, Georgie Chard NP-C [facility] Pager: [phone] 05/16/2018, 7:45 AM     For questions or updates, please contact   Please consult [email or web address] for contact  info under Cardiology/STEMI.  Patient examined chart reviewed no angina. Long discussion with son and patient last night about procedure If grafts are down will need to assess feasibility of stenting  He has two SVG buttons on aorta and free LIMA is Supposed to come of hood of one of the SVG;s   [employer]

## 2018-05-16 NOTE — Progress Notes (Signed)
Notified by central telemetry that patient experienced multiple 3 second pauses overnight.  Pt remains asymptomatic during pauses.  Will continue to monitor patient condition closely.

## 2018-05-17 ENCOUNTER — Encounter (HOSPITAL_COMMUNITY): Payer: Self-pay | Admitting: Cardiovascular Disease

## 2018-05-17 LAB — HEPARIN LEVEL (UNFRACTIONATED): Heparin Unfractionated: 0.21 IU/mL — ABNORMAL LOW (ref 0.30–0.70)

## 2018-05-17 LAB — GLUCOSE, CAPILLARY
GLUCOSE-CAPILLARY: 233 mg/dL — AB (ref 70–99)
Glucose-Capillary: 170 mg/dL — ABNORMAL HIGH (ref 70–99)
Glucose-Capillary: 181 mg/dL — ABNORMAL HIGH (ref 70–99)
Glucose-Capillary: 215 mg/dL — ABNORMAL HIGH (ref 70–99)

## 2018-05-17 MED ORDER — HEPARIN (PORCINE) IN NACL 100-0.45 UNIT/ML-% IJ SOLN
1150.0000 [IU]/h | INTRAMUSCULAR | Status: DC
  Administered 2018-05-17: 1000 [IU]/h via INTRAVENOUS
  Administered 2018-05-18 – 2018-05-20 (×3): 1150 [IU]/h via INTRAVENOUS
  Filled 2018-05-17 (×4): qty 250

## 2018-05-17 NOTE — Plan of Care (Signed)
  Problem: Health Behavior/Discharge Planning: Goal: Ability to manage health-related needs will improve Outcome: Progressing   Problem: Clinical Measurements: Goal: Ability to maintain clinical measurements within normal limits will improve Outcome: Progressing   

## 2018-05-17 NOTE — Progress Notes (Signed)
ANTICOAGULATION CONSULT NOTE   Pharmacy Consult for Heparin Indication: NSTEMI - cath shows 3 occluded grafts  Allergies  Allergen Reactions  . Eggs Or Egg-Derived Products Itching and Rash    Patient Measurements: Height: 5\' 2"  (157.5 cm) Weight: (refused RN aware) IBW/kg (Calculated) : 54.6 Heparin Dosing Weight: 64.1  Vital Signs: Temp: 99.5 F (37.5 C) (09/20 1642) Temp Source: Oral (09/20 1642) BP: 110/64 (09/20 1642) Pulse Rate: 72 (09/20 1642)  Labs: Recent Labs    05/15/18 0058  05/15/18 0628 05/15/18 1425 05/16/18 0449 05/16/18 0919 05/16/18 2020 05/17/18 1835  HGB  --    < > 11.7*  --  12.5*  --  12.0*  --   HCT  --   --  37.4*  --  39.2  --  38.1*  --   PLT  --   --  224  --  216  --  229  --   HEPARINUNFRC 0.23*  --  0.27* 0.39 0.33  --   --  0.21*  CREATININE  --    < > 0.78 0.74  --  0.77 0.68  --   TROPONINI 2.80*  --  3.08*  --   --   --   --   --    < > = values in this interval not displayed.    Estimated Creatinine Clearance: 72 mL/min (by C-G formula based on SCr of 0.68 mg/dL).  Assessment: 62yo M with recent CABG on 03/04/18 presented with chest pain and started on heparin for NSTEMI. S/p Cath 9/19 which showed all 3 grafts occluded. Plan for possible PCI on Monday. Heparin changed to Lovenox post cath yesterday but d/w Dr. Eden EmmsNishan and will restart IV heparin. CBC stable.  Heparin level slightly below goal.  No overt bleeding or complications noted.   Goal of Therapy:  Heparin level 0.3-0.7 units/ml Monitor platelets by anticoagulation protocol: Yes    Plan:  Increase IV heparin to 1150 units/hr. Recheck heparin level with AM labs. Daily heparin level and CBC.   **Pharmacist phone directory can now be found on [email or web address] ([email or web address]).  Listed under [facility]. 05/17/2018, 7:40 PM

## 2018-05-17 NOTE — Progress Notes (Signed)
Progress Note  Patient Name: Nicholas [name] Date of Encounter: 05/17/2018  Primary Cardiologist: Nanetta Batty, MD  Subjective   Patient is doing fine but daughters very emotional and cannot understand why grafts have failed  Inpatient Medications    Scheduled Meds: . aspirin  81 mg Oral Daily  . atorvastatin  80 mg Oral q1800  . clopidogrel  75 mg Oral Q breakfast  . enoxaparin (LOVENOX) injection  40 mg Subcutaneous Q24H  . finasteride  5 mg Oral Daily  . insulin aspart  0-9 Units Subcutaneous TID WC  . isosorbide mononitrate  30 mg Oral Daily  . metoprolol tartrate  12.5 mg Oral BID  . sodium chloride flush  3 mL Intravenous Q12H   Continuous Infusions: . sodium chloride     PRN Meds: sodium chloride, acetaminophen, diazepam, magnesium hydroxide, nitroGLYCERIN, ondansetron (ZOFRAN) IV, sodium chloride flush   Vital Signs    Vitals:   05/16/18 1855 05/16/18 1900 05/16/18 2000 05/16/18 2055  BP: 133/85 104/74 118/74 120/76  Pulse: 75 80 65 69  Resp: (!) 24 (!) 66  18  Temp:    97.8 F (36.6 C)  TempSrc:    Oral  SpO2: 100% 100% 99% 99%  Weight:      Height:        Intake/Output Summary (Last 24 hours) at 05/17/2018 0920 Last data filed at 05/16/2018 2222 Gross per 24 hour  Intake 107.5 ml  Output -  Net 107.5 ml   Filed Weights   05/14/18 1834 05/15/18 0450 05/16/18 0500  Weight: 63.5 kg 62.7 kg 62 kg    Physical Exam   General: Well developed, well nourished, NAD Skin: Warm, dry, intact  Head: Normocephalic, atraumatic,  clear, moist mucus membranes. Neck: Negative for carotid bruits. No JVD Lungs:Clear to ausculation bilaterally. No wheezes, rales, or rhonchi. Breathing is unlabored. Cardiovascular: RRR with S1 S2. No murmurs, rubs, gallops, or LV heave appreciated. Abdomen: Soft, non-tender, non-distended with normoactive bowel sounds. No obvious abdominal masses. MSK: Strength and tone appear normal for age. 5/5 in all  extremities Extremities: No edema. No clubbing or cyanosis. DP/PT pulses 2+ bilaterally Neuro: Alert and oriented. No focal deficits. No facial asymmetry. MAE spontaneously. Psych: Responds to questions appropriately with normal affect.    Labs    Chemistry Recent Labs  Lab 05/15/18 0628 05/15/18 1425 05/16/18 0919 05/16/18 2020  NA 138 135 134*  --   K 4.2 3.9 4.2  --   CL 105 104 103  --   CO2 23 21* 21*  --   GLUCOSE 168* 210* 277*  --   BUN 9 8 10   --   CREATININE 0.78 0.74 0.77 0.68  CALCIUM 8.7* 8.7* 8.5*  --   GFRNONAA >60 >60 >60 >60  GFRAA >60 >60 >60 >60  ANIONGAP 10 10 10   --      Hematology Recent Labs  Lab 05/15/18 0628 05/16/18 0449 05/16/18 2020  WBC 9.4 8.8 14.3*  RBC 4.55 4.83 4.64  HGB 11.7* 12.5* 12.0*  HCT 37.4* 39.2 38.1*  MCV 82.2 81.2 82.1  MCH 25.7* 25.9* 25.9*  MCHC 31.3 31.9 31.5  RDW 14.5 14.3 14.4  PLT 224 216 229    Cardiac Enzymes Recent Labs  Lab 05/14/18 1846 05/15/18 0058 05/15/18 0628  TROPONINI 2.39* 2.80* 3.08*    Recent Labs  Lab 05/14/18 1455  TROPIPOC 0.43*     BNPNo results for input(s): BNP, PROBNP in the last 168 hours.  DDimer No results for input(s): DDIMER in the last 168 hours.   Radiology    Ct Coronary Morph W/cta Cor W/score W/ca W/cm &/or Wo/cm  Addendum Date: 05/15/2018   ADDENDUM REPORT: 05/15/2018 11:26 CLINICAL DATA:  Chest pain EXAM: Cardiac CTA MEDICATIONS: Sub lingual nitro. 4mg  and lopressor 2.5mg  TECHNIQUE: The patient was scanned on a Philips 256 slice scanner. Gantry rotation speed was 270 msecs. Collimation was .9mm. A 100 kV prospective scan was triggered in the descending thoracic aorta at 111 HU's with 5% padding centered around 78% of the R-R interval. Average HR during the scan was bpm. The 3D data set was interpreted on a dedicated work station using MPR, MIP and VRT modes. A total of 80cc of contrast was used. FINDINGS: Non-cardiac: [facility]. No  significant findings on limited lung and soft tissue windows. Significant stenosis and soft plaque noted in proximal portion of left subclavian artery Normal aortic root 2.9 cm with good healing of sternum post CABG Coronary Arteries: Right dominant with no anomalies LM: Normal LAD: >70% mixed plaque in proximal vessel IM: > 70% mixed plaque in proximal vessel D1: >70% mixed plaque in mid vessel Circumflex: 50% mixed plaque OM1: >70% mixed plaque RCA: 100% occluded in mid vessel distal vessel would appear to fill from collaterals IMPRESSION: 1. Severe native 3 vessel CAD 2.  Normal healing sternotomy 3.  Normal aortic root 2.9 cm 4. Appears to have pre mature graft failure with only one of 3 grafts patent. Appers to have patent graft to diagonal. Graft to LAD and OM occluded Charlton HawsPeter Nishan Electronically Signed   By: Charlton HawsPeter  Nishan M.D.   On: 05/15/2018 11:26   Result Date: 05/15/2018 EXAM: OVER-READ INTERPRETATION  CT CHEST The following report is an over-read performed by radiologist Dr. Charlett NoseKevin Dover [facility] on 05/15/2018. This over-read does not include interpretation of cardiac or coronary anatomy or pathology. The coronary CTA interpretation by the cardiologist is attached. COMPARISON:  None. FINDINGS: Vascular: Heart is normal size. Visualized aorta is normal caliber. Scattered calcifications in the aorta. Prior CABG. Mediastinum/Nodes: No mediastinal, hilar, or axillary adenopathy. Lungs/Pleura: Lungs are clear. No focal airspace opacities or suspicious nodules. No effusions. Upper Abdomen: Imaging into the upper abdomen shows no acute findings. Musculoskeletal: Chest wall soft tissues are unremarkable. No acute bony abnormality. IMPRESSION: No acute or significant extracardiac abnormality.  Prior CABG. Electronically Signed: By: Charlett NoseKevin  Dover M.D. On: 05/15/2018 09:54   Telemetry    05/16/18 NSR with occasional 2.0-3.0 second pauses - Personally Reviewed  ECG    No new tracing as of  05/16/18- Personally Reviewed  Cardiac Studies   03/04/2018 TEE:  Aortic valve: The valve is trileaflet. No stenosis. No regurgitation.  Mitral valve: Mild regurgitation.  Right ventricle: Normal cavity size, wall thickness and ejection fraction.  Tricuspid valve: Trace regurgitation. The tricuspid valve regurgitation jet is central.  03/01/18 LHC:  Prox LAD lesion is 70% stenosed.  1st Diag lesion is 70% stenosed.  Ramus lesion is 70% stenosed.  Ost 1st Mrg lesion is 95% stenosed.  1st Mrg lesion is 60% stenosed with 60% stenosed side branch in Lat 1st Mrg.  Mid RCA lesion is 100% stenosed.  LV end diastolic pressure is normal.  1. Severe 3 vessel obstructive CAD - 70% proximal LAD with abnormal FFR of 0.73. - 95% proximal large OM1 - 100% mid RCA with left to right collaterals. 2. Normal LVEDP 3. 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 4. Severe ostial left vertebral stenosis.  Plan: Recommend CABG for coronary revascularization. The subclavian stenosis is an issue and may compromise flow in the mammary artery long term. I discussed with Dr. Allyson Sabal and he feels the subclavian stenosis can be stented but would require DAPT. Given patient's unstable coronary presentation it may be best to proceed with CABG. There appears to be enough flow to support using the LIMA as a pedicle graft. 3-4 weeks post op the patient could go on DAPT and stent the left subclavian. Will need to get surgical opinion and co-ordinate with them. Dr. Allyson Sabal would be available for the subclavian stent procedure.  02/28/18 TTE: Left ventricle: The cavity size was normal. Wall thickness was normal. Systolic function was normal. The estimated ejection fraction was in the range of 60% to 65%. Probable hypokinesis of the basalinferior myocardium. Indeterminate diastolic function. - Mitral valve: There was mild to moderate regurgitation directed posteriorly - not  well seen. - Right atrium: Central venous pressure (est): 3 mm Hg. - Atrial septum: No defect or patent foramen ovale was identified. - Tricuspid valve: There was trivial regurgitation. - Pulmonary arteries: Systolic pressure could not be accurately estimated. - Pericardium, extracardiac: There was no pericardial effusion.  Patient Profile     62 y.o. male with PMH significant for HTN, HLD, DM2, s/p 02/2018 CABGx3 (free LIMA-LAD, SVG-Diag, SVG-OM), and h/o asx subclavian artery stenosis (60mm gradient, angio and duplex) presented to [facility] with chest pain x3 days and cardiology consulted for further evaluation and management.  Assessment & Plan    1.  Chest pain s/p CABG 02/2018 with elevated troponin and hx of L subclavian stenosis>>> NSTEMI: Cath late yesterday with essentially all 3 grafts occluded Has atretic ? Vein going to OM but SVG diagonal occluded And the LIMA that was attached to the hood of SVG is occluded. Have sent messages to [facility], [facility], and [facility] to review Films and tentatively plan for PCI ? LAD on Monday would keep in hospital on heparin given SEMI and frequent pain On schedule orders not done   2. Sinus pausing: -Per tele review, pt with occasional 2.0-3.0 sec pauses>>asymptomatic -Will continue to monitor -Will obtain more current BMET for possible electrolyte disassociation   3.  HTN: -Stable, 112/68>122/55>112/62 -Continue metoprolol 25mg  twice daily  -HR 70's    4. HLD: -Stable, last LDL 56 03/2018 -Continue statin -Monitor outpatient lipid panel, LFTs  5.  DM2: -SSI for glucose control while inpatient status -On home metformin, held secondary to coronary CT plan for today -Will restart at discharge    [facility]

## 2018-05-17 NOTE — Progress Notes (Deleted)
ANTICOAGULATION CONSULT NOTE   Pharmacy Consult for Heparin Indication: NSTEMI - cath shows 3 occluded grafts  Allergies  Allergen Reactions  . Eggs Or Egg-Derived Products Itching and Rash    Patient Measurements: Height: 5\' 2"  (157.5 cm) Weight: (refused RN aware) IBW/kg (Calculated) : 54.6 Heparin Dosing Weight: 64.1  Vital Signs: Temp: 99.5 F (37.5 C) (09/20 1642) Temp Source: Oral (09/20 1642) BP: 110/64 (09/20 1642) Pulse Rate: 72 (09/20 1642)  Labs: Recent Labs    05/15/18 0058  05/15/18 0628 05/15/18 1425 05/16/18 0449 05/16/18 0919 05/16/18 2020 05/17/18 1835  HGB  --    < > 11.7*  --  12.5*  --  12.0*  --   HCT  --   --  37.4*  --  39.2  --  38.1*  --   PLT  --   --  224  --  216  --  229  --   HEPARINUNFRC 0.23*  --  0.27* 0.39 0.33  --   --  0.21*  CREATININE  --    < > 0.78 0.74  --  0.77 0.68  --   TROPONINI 2.80*  --  3.08*  --   --   --   --   --    < > = values in this interval not displayed.    Estimated Creatinine Clearance: 72 mL/min (by C-G formula based on SCr of 0.68 mg/dL).  Assessment: 62yo M with recent CABG on 03/04/18 presented with chest pain and started on heparin for NSTEMI. S/p Cath 9/19 which showed all 3 grafts occluded. Plan for possible PCI on Monday.  -heparin level= 0.21 on 1000  Units/hr Pt therapeutic on heparin 1000 units/hr pre-cath.   Goal of Therapy:  Heparin level 0.3-0.7 units/ml Monitor platelets by anticoagulation protocol: Yes    Plan: -Increase heparin to 2150 units/hr -Daily heparin level and CBC  Harland GermanAndrew Gessica Jawad, PharmD Clinical Pharmacist Please check Amion for pharmacy contact number

## 2018-05-17 NOTE — Progress Notes (Signed)
ANTICOAGULATION CONSULT NOTE   Pharmacy Consult for Heparin Indication: NSTEMI - cath shows 3 occluded grafts  Allergies  Allergen Reactions  . Eggs Or Egg-Derived Products Itching and Rash    Patient Measurements: Height: 5\' 2"  (157.5 cm) Weight: (refused RN aware) IBW/kg (Calculated) : 54.6 Heparin Dosing Weight: 64.1  Vital Signs:    Labs: Recent Labs    05/14/18 1846  05/15/18 0058 05/15/18 0628 05/15/18 1425 05/16/18 0449 05/16/18 0919 05/16/18 2020  HGB 12.2*  --   --  11.7*  --  12.5*  --  12.0*  HCT 38.6*  --   --  37.4*  --  39.2  --  38.1*  PLT 249  --   --  224  --  216  --  229  HEPARINUNFRC  --    < > 0.23* 0.27* 0.39 0.33  --   --   CREATININE 0.79  --   --  0.78 0.74  --  0.77 0.68  TROPONINI 2.39*  --  2.80* 3.08*  --   --   --   --    < > = values in this interval not displayed.    Estimated Creatinine Clearance: 72 mL/min (by C-G formula based on SCr of 0.68 mg/dL).  Assessment: 62yo M with recent CABG on 03/04/18 presented with chest pain and started on heparin for NSTEMI. S/p Cath 9/19 which showed all 3 grafts occluded. Plan for possible PCI on Monday. Heparin changed to Lovenox post cath yesterday but d/w Dr. Eden EmmsNishan and will restart IV heparin. CBC stable.  Pt therapeutic on heparin 1000 units/hr pre-cath.   Goal of Therapy:  Heparin level 0.3-0.7 units/ml Monitor platelets by anticoagulation protocol: Yes    Plan:  D/c Lovenox - pt hasn't received any yet Restart heparin gtt at 1000 units/hr F/u 6 hr heparin level Daily heparin level and CBC   Christoper Fabianaron Kirsten Spearing, PharmD, BCPS Clinical pharmacist  **Pharmacist phone directory can now be found on [email or web address] ([email or web address]).  Listed under [facility]. 05/17/2018, 9:53 AM

## 2018-05-18 LAB — HEPARIN LEVEL (UNFRACTIONATED)
Heparin Unfractionated: 0.31 IU/mL (ref 0.30–0.70)
Heparin Unfractionated: 0.41 IU/mL (ref 0.30–0.70)

## 2018-05-18 LAB — GLUCOSE, CAPILLARY
Glucose-Capillary: 178 mg/dL — ABNORMAL HIGH (ref 70–99)
Glucose-Capillary: 302 mg/dL — ABNORMAL HIGH (ref 70–99)
Glucose-Capillary: 206 mg/dL — ABNORMAL HIGH (ref 70–99)
Glucose-Capillary: 170 mg/dL — ABNORMAL HIGH (ref 70–99)

## 2018-05-18 LAB — CBC
HCT: 33.5 % — ABNORMAL LOW (ref 39.0–52.0)
Hemoglobin: 10.6 g/dL — ABNORMAL LOW (ref 13.0–17.0)
MCH: 25.8 pg — AB (ref 26.0–34.0)
MCHC: 31.6 g/dL (ref 30.0–36.0)
MCV: 81.5 fL (ref 78.0–100.0)
PLATELETS: 196 10*3/uL (ref 150–400)
RBC: 4.11 MIL/uL — AB (ref 4.22–5.81)
RDW: 14.6 % (ref 11.5–15.5)
WBC: 9.6 10*3/uL (ref 4.0–10.5)

## 2018-05-18 NOTE — Progress Notes (Signed)
Patient's HR went down into the 30's with 2 pauses 1 was 3.55 seconds and the 2nd one was 2.36 seconds with a B/P 109/65 with no other problems and no s/s of distress noted, MD text paged with info and patient evaluated, will continue to monitor.

## 2018-05-18 NOTE — Progress Notes (Signed)
ANTICOAGULATION CONSULT NOTE   Pharmacy Consult for Heparin Indication: NSTEMI - cath shows 3 occluded grafts  Allergies  Allergen Reactions  . Eggs Or Egg-Derived Products Itching and Rash    Patient Measurements: Height: 5\' 2"  (157.5 cm) Weight: (refused RN aware) IBW/kg (Calculated) : 54.6 Heparin Dosing Weight: 64.1  Vital Signs: BP: 155/136 (09/20 2312) Pulse Rate: 121 (09/20 2312)  Labs: Recent Labs    05/15/18 0628 05/15/18 1425 05/16/18 0449 05/16/18 0919 05/16/18 2020 05/17/18 1835 05/18/18 0352  HGB 11.7*  --  12.5*  --  12.0*  --  10.6*  HCT 37.4*  --  39.2  --  38.1*  --  33.5*  PLT 224  --  216  --  229  --  196  HEPARINUNFRC 0.27* 0.39 0.33  --   --  0.21* 0.31  CREATININE 0.78 0.74  --  0.77 0.68  --   --   TROPONINI 3.08*  --   --   --   --   --   --     Estimated Creatinine Clearance: 72 mL/min (by C-G formula based on SCr of 0.68 mg/dL).  Assessment: 62yo M with recent CABG on 03/04/18 presented with chest pain and started on heparin for NSTEMI. S/p Cath 9/19 which showed all 3 grafts occluded. Plan for possible PCI on Monday. Heparin changed to Lovenox post cath yesterday but d/w Dr. Eden EmmsNishan and will restart IV heparin. CBC stable.  Heparin therapeutic this am.  No overt bleeding or complications noted.   Goal of Therapy:  Heparin level 0.3-0.7 units/ml Monitor platelets by anticoagulation protocol: Yes    Plan:  Continue IV heparin at 1150 units/hr. Recheck heparin level in 6 hours Daily heparin level and CBC.   Thanks for allowing pharmacy to be a part of this patient's care. Talbert CageLora Kashmere Staffa, PharmD Clinical Pharmacist 05/18/2018, 6:05 AM

## 2018-05-18 NOTE — Progress Notes (Signed)
ANTICOAGULATION CONSULT NOTE   Pharmacy Consult for Heparin Indication: NSTEMI - cath shows 3 occluded grafts  Allergies  Allergen Reactions  . Eggs Or Egg-Derived Products Itching and Rash    Patient Measurements: Height: 5\' 2"  (157.5 cm) Weight: (pt refused ) IBW/kg (Calculated) : 54.6 Heparin Dosing Weight: 64.1  Vital Signs: BP: 109/67 (09/21 0931)  Labs: Recent Labs    05/16/18 0449 05/16/18 0919 05/16/18 2020 05/17/18 1835 05/18/18 0352 05/18/18 1336  HGB 12.5*  --  12.0*  --  10.6*  --   HCT 39.2  --  38.1*  --  33.5*  --   PLT 216  --  229  --  196  --   HEPARINUNFRC 0.33  --   --  0.21* 0.31 0.41  CREATININE  --  0.77 0.68  --   --   --     Estimated Creatinine Clearance: 72 mL/min (by C-G formula based on SCr of 0.68 mg/dL).  Assessment: 62yo M with recent CABG on 03/04/18 presented with chest pain and started on heparin for NSTEMI. S/p Cath 9/19 which showed all 3 grafts occluded. Plan for possible PCI on Monday. Pharmacy consulted to continue IV heparin.  Confirmatory heparin therapeutic. CBC stable. No bleed documented.  Goal of Therapy:  Heparin level 0.3-0.7 units/ml Monitor platelets by anticoagulation protocol: Yes   Plan:  Continue heparin gtt at 1150 units/hr Monitor daily heparin level and CBC, s/sx bleeding Tentative PCI scheduled for 9/23  Babs BertinHaley Kourtlyn Charlet, PharmD, BCPS Clinical Pharmacist Clinical phone [phone] Please check AMION for all [facility] contact numbers 05/18/2018 2:44 PM

## 2018-05-18 NOTE — Progress Notes (Signed)
Progress Note  Patient Name: Nicholas [name] Date of Encounter: 05/18/2018  Primary Cardiologist: Nanetta Batty, MD  Subjective   Patient denies CP   NO SOB    Inpatient Medications    Scheduled Meds: . aspirin  81 mg Oral Daily  . atorvastatin  80 mg Oral q1800  . clopidogrel  75 mg Oral Q breakfast  . finasteride  5 mg Oral Daily  . insulin aspart  0-9 Units Subcutaneous TID WC  . isosorbide mononitrate  30 mg Oral Daily  . metoprolol tartrate  12.5 mg Oral BID  . sodium chloride flush  3 mL Intravenous Q12H   Continuous Infusions: . sodium chloride    . heparin 1,150 Units/hr (05/18/18 0617)   PRN Meds: sodium chloride, acetaminophen, diazepam, magnesium hydroxide, nitroGLYCERIN, ondansetron (ZOFRAN) IV, sodium chloride flush   Vital Signs    Vitals:   05/16/18 2000 05/16/18 2055 05/17/18 1642 05/17/18 2312  BP: 118/74 120/76 110/64 (!) 155/136  Pulse:   72 (!) 121  Resp:  18  19  Temp:  97.8 F (36.6 C) 99.5 F (37.5 C)   TempSrc:  Oral Oral   SpO2: 99% 99% 95%   Weight:      Height:        Intake/Output Summary (Last 24 hours) at 05/18/2018 0809 Last data filed at 05/18/2018 0617 Gross per 24 hour  Intake 432.51 ml  Output -  Net 432.51 ml   Filed Weights   05/15/18 0450 05/16/18 0500  Weight: 62.7 kg 62 kg    Physical Exam   General: Well developed, well nourished, NAD Skin: Warm, dry, intact  Head: Normocephalic, atraumatic,  clear, moist mucus membranes. Neck: Negative for carotid bruits.JVP is normal   Lungs:Clear to ausculation bilaterally. No wheezes, rales, or rhonchi. Breathing is unlabored. Cardiovascular: RRR with S1 S2. No murmurs, rubs, gallops, or LV heave appreciated. Abdomen: Soft, non-tender, non-distended with MSK: Moving all extremities  Extremities: No edema. No clubbing or cyanosis. DP/PT pulses 2+ bilaterally Neuro: Alert and oriented. No focal deficits. No facial asymmetry. MAE spontaneously. Psych: Responds to  questions appropriately with normal affect.    Labs    Chemistry Recent Labs  Lab 05/15/18 0628 05/15/18 1425 05/16/18 0919 05/16/18 2020  NA 138 135 134*  --   K 4.2 3.9 4.2  --   CL 105 104 103  --   CO2 23 21* 21*  --   GLUCOSE 168* 210* 277*  --   BUN 9 8 10   --   CREATININE 0.78 0.74 0.77 0.68  CALCIUM 8.7* 8.7* 8.5*  --   GFRNONAA >60 >60 >60 >60  GFRAA >60 >60 >60 >60  ANIONGAP 10 10 10   --      Hematology Recent Labs  Lab 05/16/18 0449 05/16/18 2020 05/18/18 0352  WBC 8.8 14.3* 9.6  RBC 4.83 4.64 4.11*  HGB 12.5* 12.0* 10.6*  HCT 39.2 38.1* 33.5*  MCV 81.2 82.1 81.5  MCH 25.9* 25.9* 25.8*  MCHC 31.9 31.5 31.6  RDW 14.3 14.4 14.6  PLT 216 229 196    Cardiac Enzymes Recent Labs  Lab 05/14/18 1846 05/15/18 0058 05/15/18 0628  TROPONINI 2.39* 2.80* 3.08*    Recent Labs  Lab 05/14/18 1455  TROPIPOC 0.43*     BNPNo results for input(s): BNP, PROBNP in the last 168 hours.   DDimer No results for input(s): DDIMER in the last 168 hours.   Radiology    No results found. Telemetry  SR   60s   Longest pause 2.6 sec    Personally reviewed    ECG    No new Personally Reviewed  Cardiac Studies   9.20/19  LHC   Prox LAD lesion is 70% stenosed.  1st Diag lesion is 80% stenosed.  Ost 1st Mrg lesion is 99% stenosed.  1st Mrg lesion is 60% stenosed with 60% stenosed side branch in Lat 1st Mrg.  Mid RCA lesion is 100% stenosed.  Ramus lesion is 70% stenosed.  Origin to Prox Graft lesion is 90% stenosed.  Prox Graft to Insertion lesion is 95% stenosed.  Origin lesion is 100% stenosed.  Origin lesion is 100% stenosed.   Severe three-vessel native coronary obstructive disease with 70% proximal LAD stenosis and 80% stenosis in the first diagonal branch of the LAD.  There was no evidence for competitive flow arising from the LIMA graft.  The left circumflex marginal vessel had 99% stenosis with TIMI II flow.  The mid RCA was totally  occluded as was documented previously and there were faint antegrade collaterals as well as significant left to right retrograde collaterals supplying the PDA vessel.  Total occlusion of the free LIMA graft which apparently arose from the roof of the vein graft supplying the marginal vessel.  Severely diseased SVG to the circumflex marginal vessel with 90% proximal stenosis and diffusely atretic mid and distal graft.  Total occlusion of the SVG  which had supplied the diagonal vessel.  Moderately severe LV dysfunction with global ejection fraction at approximately 30% with severe inferior hypocontractility extending to the apex and small focal region of distal anterolateral hypocontractility.  LVEDP increased at 27 mm. 03/01/18 LHC:  Prox LAD lesion is 70% stenosed.  1st Diag lesion is 70% stenosed.  Ramus lesion is 70% stenosed.  Ost 1st Mrg lesion is 95% stenosed.  1st Mrg lesion is 60% stenosed with 60% stenosed side branch in Lat 1st Mrg.  Mid RCA lesion is 100% stenosed.  LV end diastolic pressure is normal.  1. Severe 3 vessel obstructive CAD - 70% proximal LAD with abnormal FFR of 0.73. - 95% proximal large OM1 - 100% mid RCA with left to right collaterals. 2. Normal LVEDP 3. 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 4. Severe ostial left vertebral stenosis.  Plan: Recommend CABG for coronary revascularization. The subclavian stenosis is an issue and may compromise flow in the mammary artery long term. I discussed with Dr. Allyson SabalBerry and he feels the subclavian stenosis can be stented but would require DAPT. Given patient's unstable coronary presentation it may be best to proceed with CABG. There appears to be enough flow to support using the LIMA as a pedicle graft. 3-4 weeks post op the patient could go on DAPT and stent the left subclavian. Will need to get surgical opinion and co-ordinate with them. Dr. Allyson SabalBerry would be available for the  subclavian stent procedure.  02/28/18 TTE: Left ventricle: The cavity size was normal. Wall thickness was normal. Systolic function was normal. The estimated ejection fraction was in the range of 60% to 65%. Probable hypokinesis of the basalinferior myocardium. Indeterminate diastolic function. - Mitral valve: There was mild to moderate regurgitation directed posteriorly - not well seen. - Right atrium: Central venous pressure (est): 3 mm Hg. - Atrial septum: No defect or patent foramen ovale was identified. - Tricuspid valve: There was trivial regurgitation. - Pulmonary arteries: Systolic pressure could not be accurately estimated. - Pericardium, extracardiac: There was no  pericardial effusion.  Patient Profile     62 y.o. male with PMH significant for HTN, HLD, DM2, s/p 02/2018 CABGx3 (free LIMA-LAD, SVG-Diag, SVG-OM), and h/o asx subclavian artery stenosis (60mm gradient, angio and duplex) presented to [facility] with chest pain x3 days and cardiology consulted for further evaluation and management.  Assessment & Plan    1.  Chest pain s/p CABG 02/2018 with elevated troponin and hx of L subclavian stenosis>>> NSTEMI: Cath with essentially all 3 grafts occluded Has atretic ? Vein going to OM but SVG diagonal occluded And the LIMA that was attached to the hood of SVG is occluded. plan for PCI ? LAD on Monday  Continue heparin   2. Rhythm   SInus   NO signif pauses  3.  HTN: -Stable, 112/68>122/55>112/62 -Continue metoprolol 25mg  twice daily  -HR 70's    4. HLD: -Stable, last LDL 56 03/2018 -Continue statin -Monitor outpatient lipid panel, LFTs  5.  DM2: -SSI for glucose control while inpatient status -On home metformin, held secondary to coronary CT plan for today -Will restart at discharge    Dietrich Pates

## 2018-05-19 LAB — BASIC METABOLIC PANEL
ANION GAP: 11 (ref 5–15)
BUN: 7 mg/dL — ABNORMAL LOW (ref 8–23)
CHLORIDE: 101 mmol/L (ref 98–111)
CO2: 23 mmol/L (ref 22–32)
Calcium: 8.5 mg/dL — ABNORMAL LOW (ref 8.9–10.3)
Creatinine, Ser: 0.78 mg/dL (ref 0.61–1.24)
GFR calc Af Amer: >60 mL/min (ref >60)
GFR calc non Af Amer: >60 mL/min (ref >60)
Glucose, Bld: 220 mg/dL — ABNORMAL HIGH (ref 70–99)
Potassium: 3.8 mmol/L (ref 3.5–5.1)
Sodium: 135 mmol/L (ref 135–145)

## 2018-05-19 LAB — GLUCOSE, CAPILLARY
GLUCOSE-CAPILLARY: 172 mg/dL — AB (ref 70–99)
Glucose-Capillary: 172 mg/dL — ABNORMAL HIGH (ref 70–99)
Glucose-Capillary: 328 mg/dL — ABNORMAL HIGH (ref 70–99)
Glucose-Capillary: 180 mg/dL — ABNORMAL HIGH (ref 70–99)

## 2018-05-19 LAB — CBC
HCT: 35.1 % — ABNORMAL LOW (ref 39.0–52.0)
HEMOGLOBIN: 11.1 g/dL — AB (ref 13.0–17.0)
MCH: 25.7 pg — AB (ref 26.0–34.0)
MCHC: 31.6 g/dL (ref 30.0–36.0)
MCV: 81.3 fL (ref 78.0–100.0)
Platelets: 187 10*3/uL (ref 150–400)
RBC: 4.32 MIL/uL (ref 4.22–5.81)
RDW: 14.4 % (ref 11.5–15.5)
WBC: 7.4 10*3/uL (ref 4.0–10.5)

## 2018-05-19 LAB — HEPARIN LEVEL (UNFRACTIONATED): Heparin Unfractionated: 0.35 IU/mL (ref 0.30–0.70)

## 2018-05-19 MED ORDER — ASPIRIN 81 MG PO CHEW: 81.0000 mg | CHEWABLE_TABLET | Freq: Every day | ORAL | Status: DC

## 2018-05-19 MED ORDER — SODIUM CHLORIDE 0.9 % IV SOLN: 250.0000 mL | INTRAVENOUS | Status: DC | PRN

## 2018-05-19 MED ORDER — SODIUM CHLORIDE 0.9 % WEIGHT BASED INFUSION
3.0000 mL/kg/h | INTRAVENOUS | Status: DC
  Administered 2018-05-20: 3 mL/kg/h via INTRAVENOUS

## 2018-05-19 MED ORDER — SODIUM CHLORIDE 0.9% FLUSH: 3.0000 mL | INTRAVENOUS | Status: DC | PRN

## 2018-05-19 MED ORDER — SODIUM CHLORIDE 0.9% FLUSH
3.0000 mL | Freq: Two times a day (BID) | INTRAVENOUS | Status: DC
  Administered 2018-05-19 – 2018-05-20 (×2): 3 mL via INTRAVENOUS

## 2018-05-19 MED ORDER — ASPIRIN 81 MG PO CHEW
81.0000 mg | CHEWABLE_TABLET | ORAL | Status: AC
  Administered 2018-05-20: 81 mg via ORAL
  Filled 2018-05-19: qty 1

## 2018-05-19 MED ORDER — SODIUM CHLORIDE 0.9 % WEIGHT BASED INFUSION
1.0000 mL/kg/h | INTRAVENOUS | Status: DC
  Administered 2018-05-20 (×2): 1 mL/kg/h via INTRAVENOUS

## 2018-05-19 NOTE — H&P (View-Only) (Signed)
Progress Note  Patient Name: Nicholas [name] Date of Encounter: 05/19/2018  Primary Cardiologist: Nanetta Batty, MD  Subjective   Patient denies CP   NO SOB    Inpatient Medications    Scheduled Meds: . aspirin  81 mg Oral Daily  . atorvastatin  80 mg Oral q1800  . clopidogrel  75 mg Oral Q breakfast  . finasteride  5 mg Oral Daily  . insulin aspart  0-9 Units Subcutaneous TID WC  . isosorbide mononitrate  30 mg Oral Daily  . metoprolol tartrate  12.5 mg Oral BID  . sodium chloride flush  3 mL Intravenous Q12H   Continuous Infusions: . sodium chloride    . heparin 1,150 Units/hr (05/19/18 0400)   PRN Meds: sodium chloride, acetaminophen, diazepam, magnesium hydroxide, nitroGLYCERIN, ondansetron (ZOFRAN) IV, sodium chloride flush   Vital Signs    Vitals:   05/18/18 1719 05/18/18 2005 05/18/18 2218 05/19/18 0656  BP: (!) 96/46 112/66 103/63 109/63  Pulse: (!) 56 66 65 (!) 59  Resp:  16  16  Temp: 99.1 F (37.3 C) 98.9 F (37.2 C)  98.6 F (37 C)  TempSrc: Oral Oral  Oral  SpO2: 97% 97%  97%  Weight:    62 kg  Height:        Intake/Output Summary (Last 24 hours) at 05/19/2018 0755 Last data filed at 05/19/2018 0400 Gross per 24 hour  Intake 844.31 ml  Output -  Net 844.31 ml   Filed Weights   05/16/18 0500 05/19/18 0656  Weight: 62 kg 62 kg    Physical Exam   General: Well developed, well nourished, NAD Skin: Warm, dry, intact  Head: Normocephalic, atraumatic,  clear, moist mucus membranes. Neck: Negative for carotid bruits.JVP is normal   Lungs:Clear to ausculation bilaterally. No wheezes, rales, or rhonchi. Breathing is unlabored. Cardiovascular: RRR with S1 S2. No murmurs, rubs, gallops, or LV heave appreciated. Abdomen: Soft, non-tender, non-distended with MSK: Moving all extremities  Extremities: No edema. No clubbing or cyanosis. DP/PT pulses 2+ bilaterally Neuro: Alert and oriented. No focal deficits. No facial asymmetry. MAE  spontaneously. Psych: Responds to questions appropriately with normal affect.    Labs    Chemistry Recent Labs  Lab 05/15/18 0628 05/15/18 1425 05/16/18 0919 05/16/18 2020  NA 138 135 134*  --   K 4.2 3.9 4.2  --   CL 105 104 103  --   CO2 23 21* 21*  --   GLUCOSE 168* 210* 277*  --   BUN 9 8 10   --   CREATININE 0.78 0.74 0.77 0.68  CALCIUM 8.7* 8.7* 8.5*  --   GFRNONAA >60 >60 >60 >60  GFRAA >60 >60 >60 >60  ANIONGAP 10 10 10   --      Hematology Recent Labs  Lab 05/16/18 2020 05/18/18 0352 05/19/18 0354  WBC 14.3* 9.6 7.4  RBC 4.64 4.11* 4.32  HGB 12.0* 10.6* 11.1*  HCT 38.1* 33.5* 35.1*  MCV 82.1 81.5 81.3  MCH 25.9* 25.8* 25.7*  MCHC 31.5 31.6 31.6  RDW 14.4 14.6 14.4  PLT 229 196 187    Cardiac Enzymes Recent Labs  Lab 05/14/18 1846 05/15/18 0058 05/15/18 0628  TROPONINI 2.39* 2.80* 3.08*    Recent Labs  Lab 05/14/18 1455  TROPIPOC 0.43*     BNPNo results for input(s): BNP, PROBNP in the last 168 hours.   DDimer No results for input(s): DDIMER in the last 168 hours.   Radiology  No results found. Telemetry    SR   60s   Longest pause 2.6 sec    Personally reviewed    ECG    No new Personally Reviewed  Cardiac Studies   9.20/19  LHC   Prox LAD lesion is 70% stenosed.  1st Diag lesion is 80% stenosed.  Ost 1st Mrg lesion is 99% stenosed.  1st Mrg lesion is 60% stenosed with 60% stenosed side branch in Lat 1st Mrg.  Mid RCA lesion is 100% stenosed.  Ramus lesion is 70% stenosed.  Origin to Prox Graft lesion is 90% stenosed.  Prox Graft to Insertion lesion is 95% stenosed.  Origin lesion is 100% stenosed.  Origin lesion is 100% stenosed.   Severe three-vessel native coronary obstructive disease with 70% proximal LAD stenosis and 80% stenosis in the first diagonal branch of the LAD.  There was no evidence for competitive flow arising from the LIMA graft.  The left circumflex marginal vessel had 99% stenosis with TIMI II  flow.  The mid RCA was totally occluded as was documented previously and there were faint antegrade collaterals as well as significant left to right retrograde collaterals supplying the PDA vessel.  Total occlusion of the free LIMA graft which apparently arose from the roof of the vein graft supplying the marginal vessel.  Severely diseased SVG to the circumflex marginal vessel with 90% proximal stenosis and diffusely atretic mid and distal graft.  Total occlusion of the SVG  which had supplied the diagonal vessel.  Moderately severe LV dysfunction with global ejection fraction at approximately 30% with severe inferior hypocontractility extending to the apex and small focal region of distal anterolateral hypocontractility.  LVEDP increased at 27 mm. 03/01/18 LHC:  Prox LAD lesion is 70% stenosed.  1st Diag lesion is 70% stenosed.  Ramus lesion is 70% stenosed.  Ost 1st Mrg lesion is 95% stenosed.  1st Mrg lesion is 60% stenosed with 60% stenosed side branch in Lat 1st Mrg.  Mid RCA lesion is 100% stenosed.  LV end diastolic pressure is normal.  1. Severe 3 vessel obstructive CAD - 70% proximal LAD with abnormal FFR of 0.73. - 95% proximal large OM1 - 100% mid RCA with left to right collaterals. 2. Normal LVEDP 3. 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 4. Severe ostial left vertebral stenosis.  Plan: Recommend CABG for coronary revascularization. The subclavian stenosis is an issue and may compromise flow in the mammary artery long term. I discussed with Dr. Allyson SabalBerry and he feels the subclavian stenosis can be stented but would require DAPT. Given patient's unstable coronary presentation it may be best to proceed with CABG. There appears to be enough flow to support using the LIMA as a pedicle graft. 3-4 weeks post op the patient could go on DAPT and stent the left subclavian. Will need to get surgical opinion and co-ordinate with them. Dr. Allyson SabalBerry  would be available for the subclavian stent procedure.  02/28/18 TTE: Left ventricle: The cavity size was normal. Wall thickness was normal. Systolic function was normal. The estimated ejection fraction was in the range of 60% to 65%. Probable hypokinesis of the basalinferior myocardium. Indeterminate diastolic function. - Mitral valve: There was mild to moderate regurgitation directed posteriorly - not well seen. - Right atrium: Central venous pressure (est): 3 mm Hg. - Atrial septum: No defect or patent foramen ovale was identified. - Tricuspid valve: There was trivial regurgitation. - Pulmonary arteries: Systolic pressure could not be accurately  estimated. - Pericardium, extracardiac: There was no pericardial effusion.  Patient Profile     62 y.o. male with PMH significant for HTN, HLD, DM2, s/p 02/2018 CABGx3 (free LIMA-LAD, SVG-Diag, SVG-OM), and h/o asx subclavian artery stenosis (60mm gradient, angio and duplex) presented to [facility] with chest pain x3 days and cardiology consulted for further evaluation and management.  Assessment & Plan    1.  Chest pain s/p CABG 02/2018 with elevated troponin and hx of L subclavian stenosis>>> NSTEMI: Cath with essentially all 3 grafts occluded Has atretic ? Vein going to OM but SVG diagonal occluded And the LIMA that was attached to the hood of SVG is occluded. plan for PCI of LAD, diag and OM on Monday   Continue heparin Get echo to define wall motion  2. Rhythm   SInus   NO signif pauses  3.  HTN: -Stable, 112/68>122/55>112/62 -Continue metoprolol 25mg  twice daily  -HR 70's    4. HLD: -Stable, last LDL 56 03/2018 -Continue statin -Monitor outpatient lipid panel, LFTs  5.  DM2: -SSI for glucose control while inpatient status -On home metformin, held secondary to coronary CT plan for today -Will restart at discharge    Dietrich Pates

## 2018-05-19 NOTE — Plan of Care (Signed)
VSS. CBG remains elevated. On SSI.

## 2018-05-19 NOTE — Progress Notes (Signed)
   Progress Note  Patient Name: Nicholas [name] Date of Encounter: 05/19/2018  Primary Cardiologist: Jonathan Berry, MD  Subjective   Patient denies CP   NO SOB    Inpatient Medications    Scheduled Meds: . aspirin  81 mg Oral Daily  . atorvastatin  80 mg Oral q1800  . clopidogrel  75 mg Oral Q breakfast  . finasteride  5 mg Oral Daily  . insulin aspart  0-9 Units Subcutaneous TID WC  . isosorbide mononitrate  30 mg Oral Daily  . metoprolol tartrate  12.5 mg Oral BID  . sodium chloride flush  3 mL Intravenous Q12H   Continuous Infusions: . sodium chloride    . heparin 1,150 Units/hr (05/19/18 0400)   PRN Meds: sodium chloride, acetaminophen, diazepam, magnesium hydroxide, nitroGLYCERIN, ondansetron (ZOFRAN) IV, sodium chloride flush   Vital Signs    Vitals:   05/18/18 1719 05/18/18 2005 05/18/18 2218 05/19/18 0656  BP: (!) 96/46 112/66 103/63 109/63  Pulse: (!) 56 66 65 (!) 59  Resp:  16  16  Temp: 99.1 F (37.3 C) 98.9 F (37.2 C)  98.6 F (37 C)  TempSrc: Oral Oral  Oral  SpO2: 97% 97%  97%  Weight:    62 kg  Height:        Intake/Output Summary (Last 24 hours) at 05/19/2018 0755 Last data filed at 05/19/2018 0400 Gross per 24 hour  Intake 844.31 ml  Output -  Net 844.31 ml   Filed Weights   05/16/18 0500 05/19/18 0656  Weight: 62 kg 62 kg    Physical Exam   General: Well developed, well nourished, NAD Skin: Warm, dry, intact  Head: Normocephalic, atraumatic,  clear, moist mucus membranes. Neck: Negative for carotid bruits.JVP is normal   Lungs:Clear to ausculation bilaterally. No wheezes, rales, or rhonchi. Breathing is unlabored. Cardiovascular: RRR with S1 S2. No murmurs, rubs, gallops, or LV heave appreciated. Abdomen: Soft, non-tender, non-distended with MSK: Moving all extremities  Extremities: No edema. No clubbing or cyanosis. DP/PT pulses 2+ bilaterally Neuro: Alert and oriented. No focal deficits. No facial asymmetry. MAE  spontaneously. Psych: Responds to questions appropriately with normal affect.    Labs    Chemistry Recent Labs  Lab 05/15/18 0628 05/15/18 1425 05/16/18 0919 05/16/18 2020  NA 138 135 134*  --   K 4.2 3.9 4.2  --   CL 105 104 103  --   CO2 23 21* 21*  --   GLUCOSE 168* 210* 277*  --   BUN 9 8 10  --   CREATININE 0.78 0.74 0.77 0.68  CALCIUM 8.7* 8.7* 8.5*  --   GFRNONAA >60 >60 >60 >60  GFRAA >60 >60 >60 >60  ANIONGAP 10 10 10  --      Hematology Recent Labs  Lab 05/16/18 2020 05/18/18 0352 05/19/18 0354  WBC 14.3* 9.6 7.4  RBC 4.64 4.11* 4.32  HGB 12.0* 10.6* 11.1*  HCT 38.1* 33.5* 35.1*  MCV 82.1 81.5 81.3  MCH 25.9* 25.8* 25.7*  MCHC 31.5 31.6 31.6  RDW 14.4 14.6 14.4  PLT 229 196 187    Cardiac Enzymes Recent Labs  Lab 05/14/18 1846 05/15/18 0058 05/15/18 0628  TROPONINI 2.39* 2.80* 3.08*    Recent Labs  Lab 05/14/18 1455  TROPIPOC 0.43*     BNPNo results for input(s): BNP, PROBNP in the last 168 hours.   DDimer No results for input(s): DDIMER in the last 168 hours.   Radiology      No results found. Telemetry    SR   60s   Longest pause 2.6 sec    Personally reviewed    ECG    No new Personally Reviewed  Cardiac Studies   9.20/19  LHC   Prox LAD lesion is 70% stenosed.  1st Diag lesion is 80% stenosed.  Ost 1st Mrg lesion is 99% stenosed.  1st Mrg lesion is 60% stenosed with 60% stenosed side branch in Lat 1st Mrg.  Mid RCA lesion is 100% stenosed.  Ramus lesion is 70% stenosed.  Origin to Prox Graft lesion is 90% stenosed.  Prox Graft to Insertion lesion is 95% stenosed.  Origin lesion is 100% stenosed.  Origin lesion is 100% stenosed.   Severe three-vessel native coronary obstructive disease with 70% proximal LAD stenosis and 80% stenosis in the first diagonal branch of the LAD.  There was no evidence for competitive flow arising from the LIMA graft.  The left circumflex marginal vessel had 99% stenosis with TIMI II  flow.  The mid RCA was totally occluded as was documented previously and there were faint antegrade collaterals as well as significant left to right retrograde collaterals supplying the PDA vessel.  Total occlusion of the free LIMA graft which apparently arose from the roof of the vein graft supplying the marginal vessel.  Severely diseased SVG to the circumflex marginal vessel with 90% proximal stenosis and diffusely atretic mid and distal graft.  Total occlusion of the SVG  which had supplied the diagonal vessel.  Moderately severe LV dysfunction with global ejection fraction at approximately 30% with severe inferior hypocontractility extending to the apex and small focal region of distal anterolateral hypocontractility.  LVEDP increased at 27 mm. 03/01/18 LHC:  Prox LAD lesion is 70% stenosed.  1st Diag lesion is 70% stenosed.  Ramus lesion is 70% stenosed.  Ost 1st Mrg lesion is 95% stenosed.  1st Mrg lesion is 60% stenosed with 60% stenosed side branch in Lat 1st Mrg.  Mid RCA lesion is 100% stenosed.  LV end diastolic pressure is normal.  1. Severe 3 vessel obstructive CAD - 70% proximal LAD with abnormal FFR of 0.73. - 95% proximal large OM1 - 100% mid RCA with left to right collaterals. 2. Normal LVEDP 3. 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 4. Severe ostial left vertebral stenosis.  Plan: Recommend CABG for coronary revascularization. The subclavian stenosis is an issue and may compromise flow in the mammary artery long term. I discussed with Dr. Berry and he feels the subclavian stenosis can be stented but would require DAPT. Given patient's unstable coronary presentation it may be best to proceed with CABG. There appears to be enough flow to support using the LIMA as a pedicle graft. 3-4 weeks post op the patient could go on DAPT and stent the left subclavian. Will need to get surgical opinion and co-ordinate with them. Dr. Berry  would be available for the subclavian stent procedure.  02/28/18 TTE: Left ventricle: The cavity size was normal. Wall thickness was normal. Systolic function was normal. The estimated ejection fraction was in the range of 60% to 65%. Probable hypokinesis of the basalinferior myocardium. Indeterminate diastolic function. - Mitral valve: There was mild to moderate regurgitation directed posteriorly - not well seen. - Right atrium: Central venous pressure (est): 3 mm Hg. - Atrial septum: No defect or patent foramen ovale was identified. - Tricuspid valve: There was trivial regurgitation. - Pulmonary arteries: Systolic pressure could not be accurately   estimated. - Pericardium, extracardiac: There was no pericardial effusion.  Patient Profile     62 y.o. male with PMH significant for HTN, HLD, DM2, s/p 02/2018 CABGx3 (free LIMA-LAD, SVG-Diag, SVG-OM), and h/o asx subclavian artery stenosis (60mm gradient, angio and duplex) presented to [facility] with chest pain x3 days and cardiology consulted for further evaluation and management.  Assessment & Plan    1.  Chest pain s/p CABG 02/2018 with elevated troponin and hx of L subclavian stenosis>>> NSTEMI: Cath with essentially all 3 grafts occluded Has atretic ? Vein going to OM but SVG diagonal occluded And the LIMA that was attached to the hood of SVG is occluded. plan for PCI of LAD, diag and OM on Monday   Continue heparin Get echo to define wall motion  2. Rhythm   SInus   NO signif pauses  3.  HTN: -Stable, 112/68>122/55>112/62 -Continue metoprolol 25mg twice daily  -HR 70's    4. HLD: -Stable, last LDL 56 03/2018 -Continue statin -Monitor outpatient lipid panel, LFTs  5.  DM2: -SSI for glucose control while inpatient status -On home metformin, held secondary to coronary CT plan for today -Will restart at discharge    Nicholas [name]   

## 2018-05-19 NOTE — Interval H&P Note (Signed)
Cath Lab Visit (complete for each Cath Lab visit)  Clinical Evaluation Leading to the Procedure:   ACS: Yes.    Non-ACS:    Anginal Classification: CCS III  Anti-ischemic medical therapy: Maximal Therapy (2 or more classes of medications)  Non-Invasive Test Results: No non-invasive testing performed  Prior CABG: Previous CABG      History and Physical Interval Note:  05/19/2018 4:09 PM  Nicholas [name]  has presented today for surgery, with the diagnosis of CAD  The various methods of treatment have been discussed with the patient and family. After consideration of risks, benefits and other options for treatment, the patient has consented to  Procedure(s): CORONARY STENT INTERVENTION (N/A) as a surgical intervention .  The patient's history has been reviewed, patient examined, no change in status, stable for surgery.  I have reviewed the patient's chart and labs.  Questions were answered to the patient's satisfaction.     Lyn RecordsHenry W Wayne Wicklund III

## 2018-05-19 NOTE — Progress Notes (Signed)
ANTICOAGULATION CONSULT NOTE   Pharmacy Consult for Heparin Indication: NSTEMI - cath shows 3 occluded grafts  Allergies  Allergen Reactions  . Eggs Or Egg-Derived Products Itching and Rash    Patient Measurements: Height: 5\' 2"  (157.5 cm) Weight: 136 lb 9.6 oz (62 kg) IBW/kg (Calculated) : 54.6 Heparin Dosing Weight: 64.1  Vital Signs: Temp: 98 F (36.7 C) (09/22 0757) Temp Source: Oral (09/22 0757) BP: 117/60 (09/22 0757) Pulse Rate: 84 (09/22 0852)  Labs: Recent Labs    05/16/18 2020  05/18/18 0352 05/18/18 1336 05/19/18 0354 05/19/18 0824  HGB 12.0*  --  10.6*  --  11.1*  --   HCT 38.1*  --  33.5*  --  35.1*  --   PLT 229  --  196  --  187  --   HEPARINUNFRC  --    < > 0.31 0.41 0.35  --   CREATININE 0.68  --   --   --   --  0.78   < > = values in this interval not displayed.    Estimated Creatinine Clearance: 72 mL/min (by C-G formula based on SCr of 0.78 mg/dL).  Assessment: 62yo M with recent CABG on 03/04/18 presented with chest pain and started on heparin for NSTEMI. S/p Cath 9/19 which showed all 3 grafts occluded. Plan for possible PCI on Monday. Pharmacy consulted to continue IV heparin.  Heparin level remains therapeutic. CBC stable. No bleed documented.  Goal of Therapy:  Heparin level 0.3-0.7 units/ml Monitor platelets by anticoagulation protocol: Yes   Plan:  Continue heparin gtt at 1150 units/hr Monitor daily heparin level and CBC, s/sx bleeding Tentative PCI scheduled for 9/23  Babs BertinHaley Laticia Vannostrand, PharmD, BCPS Clinical Pharmacist Clinical phone [phone] Please check AMION for all [facility] contact numbers 05/19/2018 10:28 AM

## 2018-05-20 ENCOUNTER — Encounter (HOSPITAL_COMMUNITY)
Admission: EM | Disposition: A | Discharge status: Home / Self Care | Payer: Self-pay | Source: Home / Self Care | Attending: Cardiovascular Disease

## 2018-05-20 ENCOUNTER — Other Ambulatory Visit: Payer: Self-pay

## 2018-05-20 ENCOUNTER — Encounter (HOSPITAL_COMMUNITY): Payer: Self-pay | Admitting: Interventional Cardiology

## 2018-05-20 HISTORY — PX: CORONARY STENT INTERVENTION: CATH118234

## 2018-05-20 LAB — GLUCOSE, CAPILLARY
GLUCOSE-CAPILLARY: 202 mg/dL — AB (ref 70–99)
Glucose-Capillary: 182 mg/dL — ABNORMAL HIGH (ref 70–99)
Glucose-Capillary: 149 mg/dL — ABNORMAL HIGH (ref 70–99)
Glucose-Capillary: 279 mg/dL — ABNORMAL HIGH (ref 70–99)

## 2018-05-20 LAB — BASIC METABOLIC PANEL
Anion gap: 9 (ref 5–15)
BUN: 8 mg/dL (ref 8–23)
CHLORIDE: 106 mmol/L (ref 98–111)
CO2: 24 mmol/L (ref 22–32)
CREATININE: 0.73 mg/dL (ref 0.61–1.24)
Calcium: 8.6 mg/dL — ABNORMAL LOW (ref 8.9–10.3)
GFR calc Af Amer: >60 mL/min (ref >60)
GFR calc non Af Amer: >60 mL/min (ref >60)
Glucose, Bld: 179 mg/dL — ABNORMAL HIGH (ref 70–99)
Potassium: 4.2 mmol/L (ref 3.5–5.1)
SODIUM: 139 mmol/L (ref 135–145)

## 2018-05-20 LAB — HEPARIN LEVEL (UNFRACTIONATED): Heparin Unfractionated: 0.37 IU/mL (ref 0.30–0.70)

## 2018-05-20 LAB — CBC
HEMATOCRIT: 37 % — AB (ref 39.0–52.0)
Hemoglobin: 11.6 g/dL — ABNORMAL LOW (ref 13.0–17.0)
MCH: 25.6 pg — ABNORMAL LOW (ref 26.0–34.0)
MCHC: 31.4 g/dL (ref 30.0–36.0)
MCV: 81.7 fL (ref 78.0–100.0)
Platelets: 235 10*3/uL (ref 150–400)
RBC: 4.53 MIL/uL (ref 4.22–5.81)
RDW: 14.5 % (ref 11.5–15.5)
WBC: 6.7 10*3/uL (ref 4.0–10.5)

## 2018-05-20 LAB — POCT ACTIVATED CLOTTING TIME: Activated Clotting Time: 307 seconds

## 2018-05-20 SURGERY — CORONARY STENT INTERVENTION
Anesthesia: LOCAL

## 2018-05-20 MED ORDER — BIVALIRUDIN TRIFLUOROACETATE 250 MG IV SOLR
INTRAVENOUS | Status: AC
  Filled 2018-05-20: qty 250

## 2018-05-20 MED ORDER — LIDOCAINE HCL (PF) 1 % IJ SOLN
INTRAMUSCULAR | Status: DC | PRN
  Administered 2018-05-20: 15 mL via INTRADERMAL

## 2018-05-20 MED ORDER — SODIUM CHLORIDE 0.9 % IV SOLN: 250.0000 mL | INTRAVENOUS | Status: DC | PRN

## 2018-05-20 MED ORDER — HEPARIN (PORCINE) IN NACL 1000-0.9 UT/500ML-% IV SOLN
INTRAVENOUS | Status: AC
  Filled 2018-05-20: qty 1000

## 2018-05-20 MED ORDER — MIDAZOLAM HCL 2 MG/2ML IJ SOLN
INTRAMUSCULAR | Status: AC
  Filled 2018-05-20: qty 2

## 2018-05-20 MED ORDER — OXYCODONE HCL 5 MG PO TABS: 5.0000 mg | ORAL_TABLET | ORAL | Status: DC | PRN

## 2018-05-20 MED ORDER — FENTANYL CITRATE (PF) 100 MCG/2ML IJ SOLN
INTRAMUSCULAR | Status: DC | PRN
  Administered 2018-05-20: 50 ug via INTRAVENOUS
  Administered 2018-05-20: 25 ug via INTRAVENOUS

## 2018-05-20 MED ORDER — HEPARIN (PORCINE) IN NACL 1000-0.9 UT/500ML-% IV SOLN
INTRAVENOUS | Status: DC | PRN
  Administered 2018-05-20 (×2): 500 mL

## 2018-05-20 MED ORDER — ASPIRIN 81 MG PO CHEW
81.0000 mg | CHEWABLE_TABLET | Freq: Every day | ORAL | Status: DC
  Administered 2018-05-21: 81 mg via ORAL
  Filled 2018-05-20: qty 1

## 2018-05-20 MED ORDER — CLOPIDOGREL BISULFATE 300 MG PO TABS
ORAL_TABLET | ORAL | Status: DC | PRN
  Administered 2018-05-20: 300 mg via ORAL

## 2018-05-20 MED ORDER — ISOSORBIDE MONONITRATE ER 30 MG PO TB24: 30.0000 mg | ORAL_TABLET | Freq: Every day | ORAL | 11 refills | Status: DC

## 2018-05-20 MED ORDER — NITROGLYCERIN 1 MG/10 ML FOR IR/CATH LAB
INTRA_ARTERIAL | Status: AC
  Filled 2018-05-20: qty 10

## 2018-05-20 MED ORDER — SODIUM CHLORIDE 0.9% FLUSH: 3.0000 mL | INTRAVENOUS | Status: DC | PRN

## 2018-05-20 MED ORDER — NITROGLYCERIN 0.4 MG SL SUBL: 0.4000 mg | SUBLINGUAL_TABLET | SUBLINGUAL | 12 refills | Status: DC | PRN

## 2018-05-20 MED ORDER — CLOPIDOGREL BISULFATE 300 MG PO TABS
ORAL_TABLET | ORAL | Status: AC
  Filled 2018-05-20: qty 1

## 2018-05-20 MED ORDER — NITROGLYCERIN 1 MG/10 ML FOR IR/CATH LAB
INTRA_ARTERIAL | Status: DC | PRN
  Administered 2018-05-20: 200 ug via INTRACORONARY
  Administered 2018-05-20: 100 ug via INTRACORONARY

## 2018-05-20 MED ORDER — ACETAMINOPHEN 325 MG PO TABS: 650.0000 mg | ORAL_TABLET | ORAL | Status: DC | PRN

## 2018-05-20 MED ORDER — ANGIOPLASTY BOOK
Freq: Once | Status: AC
  Administered 2018-05-20: 23:00:00
  Filled 2018-05-20: qty 1

## 2018-05-20 MED ORDER — HYDRALAZINE HCL 20 MG/ML IJ SOLN: 5.0000 mg | INTRAMUSCULAR | Status: AC | PRN

## 2018-05-20 MED ORDER — BIVALIRUDIN BOLUS VIA INFUSION - CUPID
INTRAVENOUS | Status: DC | PRN
  Administered 2018-05-20: 46.275 mg via INTRAVENOUS

## 2018-05-20 MED ORDER — MIDAZOLAM HCL 2 MG/2ML IJ SOLN
INTRAMUSCULAR | Status: DC | PRN
  Administered 2018-05-20: 1 mg via INTRAVENOUS
  Administered 2018-05-20: 0.5 mg via INTRAVENOUS
  Administered 2018-05-20: 1 mg via INTRAVENOUS

## 2018-05-20 MED ORDER — HEPARIN SODIUM (PORCINE) 5000 UNIT/ML IJ SOLN
5000.0000 [IU] | Freq: Three times a day (TID) | INTRAMUSCULAR | Status: DC
  Administered 2018-05-20 – 2018-05-21 (×2): 5000 [IU] via SUBCUTANEOUS
  Filled 2018-05-20 (×2): qty 1

## 2018-05-20 MED ORDER — SODIUM CHLORIDE 0.9% FLUSH
3.0000 mL | Freq: Two times a day (BID) | INTRAVENOUS | Status: DC
  Administered 2018-05-20 (×2): 3 mL via INTRAVENOUS

## 2018-05-20 MED ORDER — FENTANYL CITRATE (PF) 100 MCG/2ML IJ SOLN
INTRAMUSCULAR | Status: AC
  Filled 2018-05-20: qty 2

## 2018-05-20 MED ORDER — CLOPIDOGREL BISULFATE 75 MG PO TABS
75.0000 mg | ORAL_TABLET | Freq: Every day | ORAL | Status: DC
  Administered 2018-05-21: 75 mg via ORAL
  Filled 2018-05-20: qty 1

## 2018-05-20 MED ORDER — IOHEXOL 350 MG/ML SOLN
INTRAVENOUS | Status: DC | PRN
  Administered 2018-05-20: 255 mL via INTRA_ARTERIAL

## 2018-05-20 MED ORDER — LIDOCAINE HCL (PF) 1 % IJ SOLN
INTRAMUSCULAR | Status: AC
  Filled 2018-05-20: qty 30

## 2018-05-20 MED ORDER — CLOPIDOGREL BISULFATE 75 MG PO TABS: 75.0000 mg | ORAL_TABLET | Freq: Every day | ORAL | 11 refills | Status: DC

## 2018-05-20 MED ORDER — SODIUM CHLORIDE 0.9 % IV SOLN
INTRAVENOUS | Status: DC | PRN
  Administered 2018-05-20 (×2): 1.75 mg/kg/h via INTRAVENOUS

## 2018-05-20 MED ORDER — SODIUM CHLORIDE 0.9 % IV SOLN
INTRAVENOUS | Status: AC
  Administered 2018-05-20: 11:00:00 via INTRAVENOUS

## 2018-05-20 MED ORDER — ONDANSETRON HCL 4 MG/2ML IJ SOLN: 4.0000 mg | Freq: Four times a day (QID) | INTRAMUSCULAR | Status: DC | PRN

## 2018-05-20 MED ORDER — LABETALOL HCL 5 MG/ML IV SOLN: 10.0000 mg | INTRAVENOUS | Status: AC | PRN

## 2018-05-20 MED FILL — NITROGLYCERIN 0.4 MG TAB SL: 0.4 | 25 days supply | Qty: 25 | Fill #0

## 2018-05-20 MED FILL — CLOPIDOGREL 75 MG TABLET: 75 | 30 days supply | Qty: 30 | Fill #0

## 2018-05-20 MED FILL — ISOSORBIDE MN ER 30 MG TAB: 30 | 30 days supply | Qty: 30 | Fill #0

## 2018-05-20 SURGICAL SUPPLY — 25 items
BALLN EMERGE MR 2.0X8 (BALLOONS) ×2
BALLN EMERGE MR 2.5X20 (BALLOONS) ×2
BALLN ~~LOC~~ EMERGE MR 3.0X20 (BALLOONS)
BALLN ~~LOC~~ EUPHORA RX 2.75X15 (BALLOONS) ×2
BALLN ~~LOC~~ EUPHORA RX 3.0X12 (BALLOONS) ×2
BALLOON EMERGE MR 2.0X8 (BALLOONS) IMPLANT
BALLOON EMERGE MR 2.5X20 (BALLOONS) IMPLANT
BALLOON ~~LOC~~ EMERGE MR 3.0X20 (BALLOONS) IMPLANT
BALLOON ~~LOC~~ EUPHORA RX 2.75X15 (BALLOONS) IMPLANT
BALLOON ~~LOC~~ EUPHORA RX 3.0X12 (BALLOONS) IMPLANT
CATH VISTA GUIDE 6FR XB3 (CATHETERS) ×1 IMPLANT
CATH VISTA GUIDE 6FR XB3.5 (CATHETERS) ×1 IMPLANT
KIT ENCORE 26 ADVANTAGE (KITS) ×1 IMPLANT
KIT HEART LEFT (KITS) ×2 IMPLANT
KIT HEMO VALVE WATCHDOG (MISCELLANEOUS) ×1 IMPLANT
PACK CARDIAC CATHETERIZATION (CUSTOM PROCEDURE TRAY) ×2 IMPLANT
SHEATH PINNACLE 6F 10CM (SHEATH) ×1 IMPLANT
SHEATH PROBE COVER 6X72 (BAG) ×1 IMPLANT
STENT SYNERGY DES 2.75X24 (Permanent Stent) ×1 IMPLANT
STENT SYNERGY DES 2.75X28 (Permanent Stent) ×1 IMPLANT
TRANSDUCER W/STOPCOCK (MISCELLANEOUS) ×2 IMPLANT
TUBING CIL FLEX 10 FLL-RA (TUBING) ×2 IMPLANT
WIRE ASAHI PROWATER 180CM (WIRE) ×1 IMPLANT
WIRE COUGAR XT STRL 190CM (WIRE) ×1 IMPLANT
WIRE EMERALD 3MM-J .035X150CM (WIRE) ×2 IMPLANT

## 2018-05-20 NOTE — Progress Notes (Signed)
Site area: right groin  Site Prior to Removal:  Level 0  Pressure Applied For 20 MINUTES    Minutes Beginning at 1230  Manual:   Yes.    Patient Status During Pull:  stable  Post Pull Groin Site:  Level 0  Post Pull Instructions Given:  Yes.    Post Pull Pulses Present:  Yes.    Dressing Applied:  Yes.    Comments:

## 2018-05-20 NOTE — Discharge Instructions (Signed)
Information about your medication: Plavix (anti-platelet agent) ° °Generic Name (Brand): clopidogrel (Plavix), once daily medication ° °PURPOSE: You are taking this medication along with aspirin to lower your chance of having a heart attack, stroke, or blood clots in your heart stent. These can be fatal. Brilinta and aspirin help prevent platelets from sticking together and forming a clot that can block an artery or your stent.  ° °Common SIDE EFFECTS you may experience include: bruising or bleeding more easily, shortness of breath ° °Do not stop taking PLAVIX without talking to the doctor who prescribes it for you. People who are treated with a stent and stop taking Plavix too soon, have a higher risk of getting a blood clot in the stent, having a heart attack, or dying. If you stop Plavix because of bleeding, or for other reasons, your risk of a heart attack or stroke may increase.  ° °Tell all of your doctors and dentists that you are taking Plavix. They should talk to the doctor who prescribed Brilinta for you before you have any surgery or invasive procedure.  ° °Contact your health care provider if you experience: severe or uncontrollable bleeding, pink/red/brown urine, vomiting blood or vomit that looks like "coffee grounds", red or black stools (looks like tar), coughing up blood or blood clots °---------------------------------------------------------------------------------------------------------------------- ° ° °

## 2018-05-20 NOTE — Progress Notes (Signed)
4 Days Post-Op Procedure(s) (LRB): LEFT HEART CATH AND CORS/GRAFTS ANGIOGRAPHY (N/A) Subjective: Repeat cardiac cath reviewed Prior op note reviewed- very poor quality vein conduit found at time of surgery Patient has no more conduit available, not a candidate for redo CABG Consider PCI vs medical therapy  Objective: Vital signs in last 24 hours: Temp:  [98.3 F (36.8 C)-98.6 F (37 C)] 98.3 F (36.8 C) (09/23 0630) Pulse Rate:  [64-84] 64 (09/23 0630) Cardiac Rhythm: Normal sinus rhythm (09/23 0700) Resp:  [18] 18 (09/23 0630) BP: (109-120)/(65-73) 120/72 (09/23 0630) SpO2:  [96 %-97 %] 96 % (09/23 0630) Weight:  [61.7 kg] 61.7 kg (09/23 0630)  Hemodynamic parameters for last 24 hours:    Intake/Output from previous day: 09/22 0701 - 09/23 0700 In: 1025.2 [P.O.:240; I.V.:785.2] Out: -  Intake/Output this shift: No intake/output data recorded.    Lab Results: Recent Labs    05/19/18 0354 05/20/18 0509  WBC 7.4 6.7  HGB 11.1* 11.6*  HCT 35.1* 37.0*  PLT 187 235   BMET:  Recent Labs    05/19/18 0824 05/20/18 0509  NA 135 139  K 3.8 4.2  CL 101 106  CO2 23 24  GLUCOSE 220* 179*  BUN 7* 8  CREATININE 0.78 0.73  CALCIUM 8.5* 8.6*    PT/INR: No results for input(s): LABPROT, INR in the last 72 hours. ABG    Component Value Date/Time   PHART 7.389 03/05/2018 0644   HCO3 19.8 (L) 03/05/2018 0644   TCO2 22 03/05/2018 1744   ACIDBASEDEF 4.0 (H) 03/05/2018 0644   O2SAT 65.0 03/06/2018 0830   CBG (last 3)  Recent Labs    05/19/18 1619 05/19/18 2128 05/20/18 0732  GLUCAP 172* 180* 182*    Assessment/Plan: S/P Procedure(s) (LRB): LEFT HEART CATH AND CORS/GRAFTS ANGIOGRAPHY (N/A) Diabetic CAD with poor saphenous vein quality Not candidate for CABG   LOS: 5 days    Kathlee Nationseter Van Trigt III 05/20/2018

## 2018-05-21 LAB — CBC
HCT: 37 % — ABNORMAL LOW (ref 39.0–52.0)
Hemoglobin: 11.6 g/dL — ABNORMAL LOW (ref 13.0–17.0)
MCH: 25.4 pg — AB (ref 26.0–34.0)
MCHC: 31.4 g/dL (ref 30.0–36.0)
MCV: 81.1 fL (ref 78.0–100.0)
PLATELETS: 258 10*3/uL (ref 150–400)
RBC: 4.56 MIL/uL (ref 4.22–5.81)
RDW: 14.5 % (ref 11.5–15.5)
WBC: 6.7 10*3/uL (ref 4.0–10.5)

## 2018-05-21 LAB — BASIC METABOLIC PANEL
Anion gap: 12 (ref 5–15)
BUN: 8 mg/dL (ref 8–23)
CALCIUM: 8.8 mg/dL — AB (ref 8.9–10.3)
CO2: 22 mmol/L (ref 22–32)
Chloride: 103 mmol/L (ref 98–111)
Creatinine, Ser: 0.69 mg/dL (ref 0.61–1.24)
GFR calc non Af Amer: >60 mL/min (ref >60)
Glucose, Bld: 180 mg/dL — ABNORMAL HIGH (ref 70–99)
Potassium: 3.9 mmol/L (ref 3.5–5.1)
SODIUM: 137 mmol/L (ref 135–145)

## 2018-05-21 LAB — GLUCOSE, CAPILLARY
GLUCOSE-CAPILLARY: 267 mg/dL — AB (ref 70–99)
GLUCOSE-CAPILLARY: 180 mg/dL — AB (ref 70–99)

## 2018-05-21 MED ORDER — METOPROLOL TARTRATE 25 MG PO TABS: 12.5000 mg | ORAL_TABLET | Freq: Two times a day (BID) | ORAL | 11 refills | Status: DC

## 2018-05-21 MED ORDER — LISINOPRIL 2.5 MG PO TABS
2.5000 mg | ORAL_TABLET | Freq: Every day | ORAL | Status: DC
  Administered 2018-05-21: 2.5 mg via ORAL
  Filled 2018-05-21: qty 1

## 2018-05-21 MED ORDER — NITROGLYCERIN 0.4 MG SL SUBL: 0.4000 mg | SUBLINGUAL_TABLET | SUBLINGUAL | 12 refills | Status: DC | PRN

## 2018-05-21 MED ORDER — CLOPIDOGREL BISULFATE 75 MG PO TABS: 75.0000 mg | ORAL_TABLET | Freq: Every day | ORAL | 11 refills | Status: DC

## 2018-05-21 MED ORDER — ASPIRIN EC 81 MG PO TBEC: 81.0000 mg | DELAYED_RELEASE_TABLET | Freq: Every day | ORAL | Status: DC

## 2018-05-21 MED ORDER — ISOSORBIDE MONONITRATE ER 30 MG PO TB24: 30.0000 mg | ORAL_TABLET | Freq: Every day | ORAL | 11 refills | Status: DC

## 2018-05-21 NOTE — Progress Notes (Signed)
CARDIAC REHAB PHASE I   PRE:  Rate/Rhythm: 77 SR  BP:  Sitting: 123/73      SaO2: 97 RA  MODE:  Ambulation: 1300 ft 108 ST  POST:  Rate/Rhythm: 72 SR  BP:  Sitting: 139/73    SaO2: 98 RA   Pt ambulated 136400ft in hallway independently with steady fast pace. Pt denies CP or SOB. Pt and daughter educated on importance of ASA, plavix, and NTG. Reviewed MI book with pt and daughter. Pt given heart healthy and diabetic diets. Encouraged carb counting. Reviewed restrictions and exercise guidelines. Strongly encouraged pt to work up to walking 4511m 5 days a week, pt insistent on sticking with 2477m. Will refer to CRP II [place], pt declined previously and might do so again.  [phone] Reynold Boweneresa  Vincentina Sollers, RN BSN 05/21/2018 9:01 AM

## 2018-05-21 NOTE — Discharge Summary (Signed)
Discharge Summary    Patient ID: Nicholas [name],  MRN: [id number], DOB/AGE: Aug 01, 1956 62 y.o.  Admit date: 05/14/2018 Discharge date: 05/21/2018  Primary Care Provider: Anselmo PicklerAchreja, Manjeet Kaur Primary Cardiologist: Nanetta BattyJonathan Berry, MD   Discharge Diagnoses    Principal Problem:   NSTEMI (non-ST elevated myocardial infarction) [facility]) Active Problems:   Non-insulin dependent type 2 diabetes mellitus (HCC)   Dyslipidemia, goal LDL below 70   Chest pain   Allergies Allergies  Allergen Reactions  . Eggs Or Egg-Derived Products Itching and Rash    Diagnostic Studies/Procedures    CARDIAC CATH: 05/16/2018  Prox LAD lesion is 70% stenosed.  1st Diag lesion is 80% stenosed.  Ost 1st Mrg lesion is 99% stenosed.  1st Mrg lesion is 60% stenosed with 60% stenosed side branch in Lat 1st Mrg.  Mid RCA lesion is 100% stenosed.  Ramus lesion is 70% stenosed.  Origin to Prox Graft lesion is 90% stenosed.  Prox Graft to Insertion lesion is 95% stenosed.  Origin lesion is 100% stenosed.  Origin lesion is 100% stenosed.   Severe three-vessel native coronary obstructive disease with 70% proximal LAD stenosis and 80% stenosis in the first diagonal branch of the LAD.  There was no evidence for competitive flow arising from the LIMA graft.  The left circumflex marginal vessel had 99% stenosis with TIMI II flow.  The mid RCA was totally occluded as was documented previously and there were faint antegrade collaterals as well as significant left to right retrograde collaterals supplying the PDA vessel.  Total occlusion of the free LIMA graft which apparently arose from the roof of the vein graft supplying the marginal vessel.  Severely diseased SVG to the circumflex marginal vessel with 90% proximal stenosis and diffusely atretic mid and distal graft.  Total occlusion of the SVG  which had supplied the diagonal vessel.  Moderately severe LV dysfunction with global ejection fraction  at approximately 30% with severe inferior hypocontractility extending to the apex and small focal region of distal anterolateral hypocontractility.  LVEDP increased at 27 mm.  RECOMMENDATION: Angiograms will be reviewed with colleagues and will asked Dr. Morton PetersVan Tright to review.  There is total occlusion of the graft which had supplied the diagonal and LIMA graft and the graft supplying the marginal vessel is diffusely atretic.  The patient will be started on beta-blocker therapy and nitrates.  Consider the addition of amlodipine and possible ranolazine.  Consider potential stenting of the native obtuse marginal vessel and perhaps the proximal LAD and diagonal vessels.  Will initiate dual antiplatelet therapy.  Recommend dual antiplatelet therapy with Aspirin 81mg  daily and Clopidogrel 75mg  daily long-term (beyond 12 months) because of severe disease and early graft closure.  Diagnostic Diagram          PCI: 05/20/2018  Segmental 70% proximal LAD reduced to 0% with 2.75 x 24 Synergy postdilated to 3.0 mm in diameter and resultant TIMI grade III flow.  Total occlusion of the proximal to mid circumflex reduced to 0% with TIMI grade III flow using a 2.75 x 28 mm Synergy postdilated to 3.0 mm in diameter with TIMI grade III flow.  Angioplasty of mid 90% stenosis in the first diagonal reduced to less than 50% using a 2.0 x 8 mm balloon.  RECOMMENDATIONS:   Aggressive risk factor modification: A1c less than 7, blood pressure less than 130/80 mmHg, LDL less than 70, moderate aerobic activity.  Aspirin and Plavix for 12 months.  Consider referral to the CTO  team to contemplate RCA territory revascularization. Post-Intervention Diagram       _____________   History of Present Illness     62 y.o. male with PMH significant for HTN, HLD, DM2, s/p 02/2018 CABGx3 (free LIMA-LAD, SVG-Diag, SVG-OM), and h/o asx subclavian artery stenosis (60mm gradient, angio and duplex) presented [facility]17 with chest pain x3 days and cardiology admitted for further evaluation and management.  Hospital Course     Consultants: None  [name] had non-STEMI, s/p bypass surgery 03/04/2018, discharged 03/09/2018.  Dr. Allyson Sabal saw him 04/24/2018 and he was doing well, no acute problems.  He came to the hospital on 05/14/2018 with chest pain, shoulder pain and back pain that had been intermittent for several days.  In the emergency room, his cardiac enzymes were elevated and he was admitted by cardiology.  1.  Non-STEMI: Peak troponin 3.08.  He was taken to the Cath Lab on 05/16/2018.  Results are above.  The LIMA was occluded, the SVG to diagonal was occluded and there was significant disease in the SVG-OM.  The RCA was also occluded.  Dr. Maren Beach did not formally consult, but reviewed the films and noted that he had poor vein quality and poor distal targets.  He is not a candidate for redo CABG.  He was managed medically, and taken back to the Cath Lab on 9/23 for PCI.  He had drug-eluting stents to the LAD and OM, tolerating the procedure well.  He was seen by cardiac rehab and ambulated with them.  He is on Imdur as well as beta-blocker, aspirin and Plavix.  Consider revascularization of CTO RCA if symptoms are not controlled.  2.  Ischemic cardiomyopathy: His EF was 30% at cath with wall motion abnormalities, previously 60-65%.  He is on a beta-blocker and lisinopril.  His blood pressure is borderline, he is on a low dose of lisinopril instead of Entresto.  He had previously tolerated the lisinopril well.  He will get a follow-up echocardiogram in 6-12 weeks and hopefully, his EF will improve.  3.  Hyperlipidemia: His last LDL was 56, continue high-dose statin.  4.  Bradycardia: He had some dropped beats and because of that had some bradycardia.  However, he was asymptomatic, continue beta-blocker at a reduced dose.  5.  Diabetes: His blood sugars were managed with sliding scale  insulin while he is here.  He is encouraged to stick tightly to a diabetic diet and follow-up with his PCP.  6.  Hypertension: His blood pressure was fairly well controlled, but was on the low side at times.  His lisinopril was restarted at the previous dose and he is to remain on the beta-blocker at a reduced dose.  Med titration as an outpatient.  On 9/24, he was seen by Dr. Antoine Poche and all data were reviewed.  No further inpatient work-up is indicated and he is considered stable for discharge, with outpatient follow-up arranged  _____________  Discharge Vitals Blood pressure 139/73, pulse 76, temperature 98.5 F (36.9 C), temperature source Oral, resp. rate (!) 31, height 5\' 2"  (1.575 m), weight 61.3 kg, SpO2 96 %.  Filed Weights   05/19/18 0656 05/20/18 0630 05/21/18 0645  Weight: 62 kg 61.7 kg 61.3 kg    Labs & Radiologic Studies    CBC Recent Labs    05/20/18 0509 05/21/18 0453  WBC 6.7 6.7  HGB 11.6* 11.6*  HCT 37.0* 37.0*  MCV 81.7 81.1  PLT 235 258   Basic  Metabolic Panel Recent Labs    16/10/96 0509 05/21/18 0453  NA 139 137  K 4.2 3.9  CL 106 103  CO2 24 22  GLUCOSE 179* 180*  BUN 8 8  CREATININE 0.73 0.69  CALCIUM 8.6* 8.8*    Cardiac Enzymes Troponin I  Date Value Ref Range Status  05/15/2018 3.08 (HH) <0.03 ng/mL Final    Comment:    CRITICAL RESULT CALLED TO, READ BACK BY AND VERIFIED WITH: Y.Dory Peru 05/15/18 0754 DAVISB Performed at [facility], [address] [place], [place] [address]   05/15/2018 2.80 (HH) <0.03 ng/mL Final    Comment:    CRITICAL VALUE NOTED.  VALUE IS CONSISTENT WITH PREVIOUSLY REPORTED AND CALLED VALUE. Performed at [facility], [address] [place], [place] [address]   05/14/2018 2.39 (HH) <0.03 ng/mL Final    Comment:    CRITICAL RESULT CALLED TO, READ BACK BY AND VERIFIED WITH: C. HIACKLEY RN [id number] 2100 GREEN R Performed at [facility], [address] [place], [place] [address]     Hemoglobin A1C Lab Results  Component Value Date   HGBA1C 7.6 (H) 03/02/2018   Fasting Lipid Panel Cholesterol, Total  Date Value Ref Range Status  04/25/2018 124 100 - 199 mg/dL Final   HDL  Date Value Ref Range Status  04/25/2018 39 (L) >39 mg/dL Final   LDL Calculated  Date Value Ref Range Status  04/25/2018 56 0 - 99 mg/dL Final   Triglycerides  Date Value Ref Range Status  04/25/2018 146 0 - 149 mg/dL Final   _____________  Dg Chest 2 View  Result Date: 05/14/2018 CLINICAL DATA:  Chest pain for 2 days EXAM: CHEST - 2 VIEW COMPARISON:  April 10, 2018 FINDINGS: Lungs are clear. Heart size and pulmonary vascularity are normal. No adenopathy. Patient is status post coronary artery bypass grafting. There is aortic atherosclerosis. No evident bone lesions. No pneumothorax. IMPRESSION: No edema or consolidation. Status post coronary artery bypass grafting. There is aortic atherosclerosis. Aortic Atherosclerosis (ICD10-I70.0). Electronically Signed   By: Bretta Bang III M.D.   On: 05/14/2018 15:24   Ct Coronary Morph W/cta Cor W/score W/ca W/cm &/or Wo/cm  Addendum Date: 05/15/2018   ADDENDUM REPORT: 05/15/2018 11:26 CLINICAL DATA:  Chest pain EXAM: Cardiac CTA MEDICATIONS: Sub lingual nitro. 4mg  and lopressor 2.5mg  TECHNIQUE: The patient was scanned on a Philips 256 slice scanner. Gantry rotation speed was 270 msecs. Collimation was .9mm. A 100 kV prospective scan was triggered in the descending thoracic aorta at 111 HU's with 5% padding centered around 78% of the R-R interval. Average HR during the scan was bpm. The 3D data set was interpreted on a dedicated work station using MPR, MIP and VRT modes. A total of 80cc of contrast was used. FINDINGS: Non-cardiac: See separate report from [facility]. No significant findings on limited lung and soft tissue windows. Significant stenosis and soft plaque noted in proximal portion of left subclavian artery Normal aortic root  2.9 cm with good healing of sternum post CABG Coronary Arteries: Right dominant with no anomalies LM: Normal LAD: >70% mixed plaque in proximal vessel IM: > 70% mixed plaque in proximal vessel D1: >70% mixed plaque in mid vessel Circumflex: 50% mixed plaque OM1: >70% mixed plaque RCA: 100% occluded in mid vessel distal vessel would appear to fill from collaterals IMPRESSION: 1. Severe native 3 vessel CAD 2.  Normal healing sternotomy 3.  Normal aortic root 2.9 cm 4. Appears  to have pre mature graft failure with only one of 3 grafts patent. Appers to have patent graft to diagonal. Graft to LAD and OM occluded Charlton Haws Electronically Signed   By: Charlton Haws M.D.   On: 05/15/2018 11:26   Result Date: 05/15/2018 EXAM: OVER-READ INTERPRETATION  CT CHEST The following report is an over-read performed by radiologist Dr. Charlett Nose of [facility] on 05/15/2018. This over-read does not include interpretation of cardiac or coronary anatomy or pathology. The coronary CTA interpretation by the cardiologist is attached. COMPARISON:  None. FINDINGS: Vascular: Heart is normal size. Visualized aorta is normal caliber. Scattered calcifications in the aorta. Prior CABG. Mediastinum/Nodes: No mediastinal, hilar, or axillary adenopathy. Lungs/Pleura: Lungs are clear. No focal airspace opacities or suspicious nodules. No effusions. Upper Abdomen: Imaging into the upper abdomen shows no acute findings. Musculoskeletal: Chest wall soft tissues are unremarkable. No acute bony abnormality. IMPRESSION: No acute or significant extracardiac abnormality.  Prior CABG. Electronically Signed: By: Charlett Nose M.D. On: 05/15/2018 09:54   Disposition   Pt is being discharged home today in improved condition.  Follow-up Plans & Appointments     Discharge Instructions    Amb Referral to Cardiac Rehabilitation   Complete by:  As directed    Diagnosis:   NSTEMI Coronary Stents     Diet - low sodium heart healthy    Complete by:  As directed    Diet Carb Modified   Complete by:  As directed    Increase activity slowly   Complete by:  As directed       Discharge Medications   Allergies as of 05/21/2018      Reactions   Eggs Or Egg-derived Products Itching, Rash      Medication List    TAKE these medications   aspirin EC 81 MG tablet Take 1 tablet (81 mg total) by mouth daily. What changed:    medication strength  how much to take   atorvastatin 80 MG tablet Commonly known as:  LIPITOR Take 1 tablet (80 mg total) by mouth daily at 6 PM.   clopidogrel 75 MG tablet Commonly known as:  PLAVIX Take 1 tablet (75 mg total) by mouth daily with breakfast.   finasteride 5 MG tablet Commonly known as:  PROSCAR Take 5 mg by mouth daily.   isosorbide mononitrate 30 MG 24 hr tablet Commonly known as:  IMDUR Take 1 tablet (30 mg total) by mouth daily.   lisinopril 2.5 MG tablet Commonly known as:  PRINIVIL,ZESTRIL Take 2.5 mg by mouth at bedtime.   metFORMIN 500 MG tablet Commonly known as:  GLUCOPHAGE Take 500 mg by mouth 2 (two) times daily with a meal.   metoprolol tartrate 25 MG tablet Commonly known as:  LOPRESSOR Take 0.5 tablets (12.5 mg total) by mouth 2 (two) times daily. What changed:  how much to take   nitroGLYCERIN 0.4 MG SL tablet Commonly known as:  NITROSTAT Place 1 tablet (0.4 mg total) under the tongue every 5 (five) minutes x 3 doses as needed for chest pain.        Aspirin prescribed at discharge?  Yes High Intensity Statin Prescribed? (Lipitor 40-80mg  or Crestor 20-40mg ): Yes Beta Blocker Prescribed? Yes For EF <40%, was ACEI/ARB Prescribed? Yes ADP Receptor Inhibitor Prescribed? (i.e. Plavix etc.-Includes Medically Managed Patients): Yes For EF <40%, Aldosterone Inhibitor Prescribed? No: BP will not tolerate Was EF assessed during THIS hospitalization? Yes Was Cardiac Rehab II ordered? (Included Medically managed Patients):  Yes   Outstanding  Labs/Studies   None  Duration of Discharge Encounter   Greater than 30 minutes including physician time.  Bobbye Riggs Raquell Richer NP 05/21/2018, 12:29 PM

## 2018-05-21 NOTE — Progress Notes (Signed)
Progress Note  Patient Name: Nicholas [name] Date of Encounter: 05/21/2018  Primary Cardiologist:   Nanetta Batty, MD   Subjective   No chest pain.  No SOB.   Inpatient Medications    Scheduled Meds: . aspirin  81 mg Oral Daily  . atorvastatin  80 mg Oral q1800  . clopidogrel  75 mg Oral Q breakfast  . finasteride  5 mg Oral Daily  . heparin  5,000 Units Subcutaneous Q8H  . insulin aspart  0-9 Units Subcutaneous TID WC  . isosorbide mononitrate  30 mg Oral Daily  . metoprolol tartrate  12.5 mg Oral BID  . sodium chloride flush  3 mL Intravenous Q12H   Continuous Infusions: . sodium chloride     PRN Meds: sodium chloride, acetaminophen, diazepam, magnesium hydroxide, nitroGLYCERIN, ondansetron (ZOFRAN) IV, oxyCODONE, sodium chloride flush   Vital Signs    Vitals:   05/20/18 2206 05/20/18 2354 05/21/18 0645 05/21/18 0906  BP: 124/73 117/77 116/66 139/73  Pulse: 78  63 76  Resp:  (!) 31    Temp: 98.4 F (36.9 C)  98.5 F (36.9 C)   TempSrc: Oral  Oral   SpO2: 97%  96%   Weight:   61.3 kg   Height:        Intake/Output Summary (Last 24 hours) at 05/21/2018 1057 Last data filed at 05/21/2018 0620 Gross per 24 hour  Intake 924.54 ml  Output 1654 ml  Net -729.46 ml   Filed Weights   05/19/18 0656 05/20/18 0630 05/21/18 0645  Weight: 62 kg 61.7 kg 61.3 kg    Telemetry    NSR - Personally Reviewed  ECG    NA - Personally Reviewed  Physical Exam   GEN: No acute distress.   Neck: No  JVD Cardiac: RRR, no murmurs, rubs, or gallops.  Respiratory: Clear  to auscultation bilaterally. GI: Soft, nontender, non-distended  MS: No  edema; No deformity.  Right femoral site OK. Neuro:  Nonfocal  Psych: Normal affect   Labs    Chemistry Recent Labs  Lab 05/19/18 0824 05/20/18 0509 05/21/18 0453  NA 135 139 137  K 3.8 4.2 3.9  CL 101 106 103  CO2 23 24 22   GLUCOSE 220* 179* 180*  BUN 7* 8 8  CREATININE 0.78 0.73 0.69  CALCIUM 8.5* 8.6* 8.8*    GFRNONAA >60 >60 >60  GFRAA >60 >60 >60  ANIONGAP 11 9 12      Hematology Recent Labs  Lab 05/19/18 0354 05/20/18 0509 05/21/18 0453  WBC 7.4 6.7 6.7  RBC 4.32 4.53 4.56  HGB 11.1* 11.6* 11.6*  HCT 35.1* 37.0* 37.0*  MCV 81.3 81.7 81.1  MCH 25.7* 25.6* 25.4*  MCHC 31.6 31.4 31.4  RDW 14.4 14.5 14.5  PLT 187 235 258    Cardiac Enzymes Recent Labs  Lab 05/14/18 1846 05/15/18 0058 05/15/18 0628  TROPONINI 2.39* 2.80* 3.08*    Recent Labs  Lab 05/14/18 1455  TROPIPOC 0.43*     BNPNo results for input(s): BNP, PROBNP in the last 168 hours.   DDimer No results for input(s): DDIMER in the last 168 hours.   Radiology    No results found.  Cardiac Studies   CARDIAC CATH:  Intervention.  Films reviewed personally today by me    Segmental 70% proximal LAD reduced to 0% with 2.75 x 24 Synergy postdilated to 3.0 mm in diameter and resultant TIMI grade III flow.  Total occlusion of the proximal to mid circumflex reduced  to 0% with TIMI grade III flow using a 2.75 x 28 mm Synergy postdilated to 3.0 mm in diameter with TIMI grade III flow.  Angioplasty of mid 90% stenosis in the first diagonal reduced to less than 50% using a 2.0 x 8 mm balloon.  RECOMMENDATIONS:   Aggressive risk factor modification: A1c less than 7, blood pressure less than 130/80 mmHg, LDL less than 70, moderate aerobic activity.  Aspirin and Plavix for 12 months.  Consider referral to the CTO team to contemplate RCA territory reeve ask.    Patient Profile     62 y.o. male with PMH significant for HTN, HLD, DM2, s/p 02/2018 CABGx3 (free LIMA-LAD, SVG-Diag, SVG-OM), and h/o asx subclavian artery stenosis (60mm gradient, angio and duplex) [facility] chest pain x3 days and cardiology consulted for further evaluation and management.  Assessment & Plan    UNSTABLE ANGINA:    PCIs as above.  ASA and Plavix x 12 months. Can consider RCA revascularization if symptoms  persist.   Continue current therapy.   CARDIOMYOPATHY:  EF 30 % by cath.   This would be new and can be reassessed with echo as an out patient in the months to come.   HTN:    BP controlled. However, given the reduced EF I will restart the Lisinopril that he was on before admission.   DYSLIPIDEMIA:   Last LDL was 56.  Continue current meds.        DM:   Needs diabetes control per PCP.    BRADYCARDIA:  One non conducted P wave last night.  He might have felt this or this could have been a vagal event.  I think he should have a Holter put on at his follow up office appt.      FOLLOW UP TOC in 7 days.    For questions or updates, please contact [facility] Please consult [email or web address] for contact info under Cardiology/STEMI.   Signed, Rollene RotundaJames Yajayra Feldt, MD  05/21/2018, 10:57 AM

## 2018-05-22 ENCOUNTER — Telehealth (HOSPITAL_COMMUNITY): Payer: Self-pay

## 2018-05-22 NOTE — Telephone Encounter (Signed)
Called patient to see if he is interested in the Cardiac Rehab Program. Patient stated he is not interested at this time.  Closed referral 

## 2018-05-29 ENCOUNTER — Encounter: Payer: Self-pay | Admitting: Cardiology

## 2018-05-29 ENCOUNTER — Ambulatory Visit (INDEPENDENT_AMBULATORY_CARE_PROVIDER_SITE_OTHER): Payer: BLUE CROSS/BLUE SHIELD | Admitting: Cardiology

## 2018-05-29 VITALS — BP 130/74 | HR 73 | Ht 62.0 in | Wt 135.0 lb

## 2018-05-29 DIAGNOSIS — I771 Stricture of artery: Secondary | ICD-10-CM

## 2018-05-29 DIAGNOSIS — E119 Type 2 diabetes mellitus without complications: Secondary | ICD-10-CM

## 2018-05-29 DIAGNOSIS — I214 Non-ST elevation (NSTEMI) myocardial infarction: Secondary | ICD-10-CM

## 2018-05-29 DIAGNOSIS — Z9861 Coronary angioplasty status: Secondary | ICD-10-CM

## 2018-05-29 DIAGNOSIS — Z951 Presence of aortocoronary bypass graft: Secondary | ICD-10-CM | POA: Diagnosis not present

## 2018-05-29 DIAGNOSIS — E785 Hyperlipidemia, unspecified: Secondary | ICD-10-CM

## 2018-05-29 DIAGNOSIS — I2510 Atherosclerotic heart disease of native coronary artery without angina pectoris: Secondary | ICD-10-CM

## 2018-05-29 HISTORY — DX: Atherosclerotic heart disease of native coronary artery without angina pectoris: I25.10

## 2018-05-29 NOTE — Assessment & Plan Note (Signed)
03/04/18- Coronary artery bypass grafting x3 (free left internal mammary artery graft to left anterior descending, saphenous vein graft to obtuse marginal, saphenous vein graft to diagonal. Normal LVF pre op  

## 2018-05-29 NOTE — Assessment & Plan Note (Signed)
Pt seen today post NSTEMI-05/16/18- Troponin 3.0

## 2018-05-29 NOTE — Progress Notes (Signed)
05/29/2018 Nicholas [name]   [birth year]  [id number]  Primary Physician Achreja, Youlanda Mighty, MD Primary Cardiologist: Dr Allyson Sabal  HPI:  62 y/o male from [place] with a history of LSCA stenosis, (seen by Dr Imogene Burn in 2013) HTN,  and NIDDM, presented 02/27/18 with chest pain and EKG changes. He ruled in for a NSTEMI with peak troponin of 0.11. He was taken to the cath lab 02/28/18 where cath showed severe 3V CAD. Echo showed normal LVF.  He underwent CABG x 3 on 03/04/18 (using a free LIMA). He tolerated this well. He had an unremarkable post op course and was seen in the office 03/27/18 and again 04/24/18 for follow up and was doing well.  He then presented to the ED 05/14/18 with NSTEMI- Troponin peak 3.0. Cath was done and revealed occlusion of all his grafts. He underwent successful PCI to 70% native LAD and PCI to CTO of his CFX  He has residulal CTO of his RCA with R-R and L-R collaterals. He is in the office today for follow up. His daughter accompanied him and helped with the language barrier. The chest pain he was having is gone though his history is a little difficult. He has multiple different types of "chest pain" but overall seems to be doing well. 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    Current Outpatient Medications  Medication Sig Dispense Refill  . aspirin 81 MG tablet Take 1 tablet (81 mg total) by mouth daily.    Marland Kitchen atorvastatin (LIPITOR) 80 MG tablet Take 1 tablet (80 mg total) by mouth daily at 6 PM. 90 tablet 3  . clopidogrel (PLAVIX) 75 MG tablet Take 1 tablet (75 mg total) by mouth daily with breakfast. 30 tablet 11  . finasteride (PROSCAR) 5 MG tablet Take 5 mg by mouth daily.    . isosorbide mononitrate (IMDUR) 30 MG 24 hr tablet Take 1 tablet (30 mg total) by mouth daily. 30 tablet 11  . metFORMIN (GLUCOPHAGE) 500 MG tablet Take 500 mg by mouth 2 (two) times daily with a meal.    . metoprolol tartrate (LOPRESSOR) 25 MG tablet  Take 0.5 tablets (12.5 mg total) by mouth 2 (two) times daily. 33 tablet 11  . nitroGLYCERIN (NITROSTAT) 0.4 MG SL tablet Place 1 tablet (0.4 mg total) under the tongue every 5 (five) minutes x 3 doses as needed for chest pain. 25 tablet 12   No current facility-administered medications for this visit.     Allergies  Allergen Reactions  . Eggs Or Egg-Derived Products Itching and Rash    Past Medical History:  Diagnosis Date  . Coronary artery disease   . Heart murmur   . High cholesterol   . Hypertension   . NSTEMI (non-ST elevated myocardial infarction) (HCC) 05/14/2018  . PONV (postoperative nausea and vomiting)   . Type II diabetes mellitus (HCC)     Social History   Socioeconomic History  . Marital status: Married    Spouse name: Not on file  . Number of children: Not on file  . Years of education: Not on file  . Highest education level: Not on file  Occupational History  . Not on file  Social Needs  . Financial resource strain: Not on file  . Food insecurity:    Worry: Not on file    Inability: Not on file  . Transportation needs:    Medical: Not on file  Non-medical: Not on file  Tobacco Use  . Smoking status: Never Smoker  . Smokeless tobacco: Never Used  Substance and Sexual Activity  . Alcohol use: Not Currently    Frequency: Never    Comment: 05/20/2018 "stopped before 1999"  . Drug use: Never  . Sexual activity: Not on file  Lifestyle  . Physical activity:    Days per week: Not on file    Minutes per session: Not on file  . Stress: Not on file  Relationships  . Social connections:    Talks on phone: Not on file    Gets together: Not on file    Attends religious service: Not on file    Active member of club or organization: Not on file    Attends meetings of clubs or organizations: Not on file    Relationship status: Not on file  . Intimate partner violence:    Fear of current or ex partner: Not on file    Emotionally abused: Not on file     Physically abused: Not on file    Forced sexual activity: Not on file  Other Topics Concern  . Not on file  Social History Narrative  . Not on file     Family History  Problem Relation Age of Onset  . Heart disease Mother      Review of Systems: General: negative for chills, fever, night sweats or weight changes.  Cardiovascular: negative for chest pain, dyspnea on exertion, edema, orthopnea, palpitations, paroxysmal nocturnal dyspnea or shortness of breath Dermatological: negative for rash Respiratory: negative for cough or wheezing Urologic: negative for hematuria Abdominal: negative for nausea, vomiting, diarrhea, bright red blood per rectum, melena, or hematemesis Neurologic: negative for visual changes, syncope, or dizziness All other systems reviewed and are otherwise negative except as noted above.    Blood pressure 130/74, pulse 73, height 5\' 2"  (1.575 m), weight 135 lb (61.2 kg), SpO2 99 %.  General appearance: alert, cooperative and no distress Neck: no carotid bruit and no JVD Lungs: clear to auscultation bilaterally Heart: regular rate and rhythm Extremities: no edema Skin: Skin color, texture, turgor normal. No rashes or lesions Neurologic: Grossly normal  EKG NSR, inferior lateral TWI  ASSESSMENT AND PLAN:   NSTEMI (non-ST elevated myocardial infarction) (HCC) Pt seen today post NSTEMI-05/16/18- Troponin 3.0  CAD S/P percutaneous coronary angioplasty S/P native CTO CFX and 70% LAD PCI with DES 05/16/18 after early occlusion of his grafts  S/P CABG x 3 03/04/18- Coronary artery bypass grafting x3 (free left internal mammary artery graft to left anterior descending, saphenous vein graft to obtuse marginal, saphenous vein graft to diagonal. 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 I stopped his Lisinopril. No work till cleared by Dr Allyson Sabal in 6 weeks.  Corine Shelter PA-C 05/29/2018 5:10 PM

## 2018-05-29 NOTE — Assessment & Plan Note (Signed)
On high dose statin rx 

## 2018-05-29 NOTE — Assessment & Plan Note (Signed)
S/P native CTO CFX and 70% LAD PCI with DES 05/16/18 after early occlusion of his grafts

## 2018-05-29 NOTE — Patient Instructions (Signed)
Medication Instructions:  STOP Lisinopril  If you need a refill on your cardiac medications before your next appointment, please call your pharmacy.  Labwork: None   Testing/Procedures: None   Follow-Up: Your physician recommends that you schedule a follow-up appointment in: 6 weeks with Dr Antoine Poche ONLY  Any Other Special Instructions Will Be Listed Below (If Applicable).

## 2018-05-29 NOTE — Assessment & Plan Note (Signed)
Left subclavian stenosis 75% with pressure gradient of 60 mm Hg at cath 

## 2018-05-29 NOTE — Assessment & Plan Note (Signed)
On Glucophage 

## 2018-05-31 ENCOUNTER — Other Ambulatory Visit: Payer: Self-pay | Admitting: Cardiology

## 2018-05-31 ENCOUNTER — Encounter: Payer: Self-pay | Admitting: Cardiology

## 2018-05-31 ENCOUNTER — Ambulatory Visit (INDEPENDENT_AMBULATORY_CARE_PROVIDER_SITE_OTHER): Payer: BLUE CROSS/BLUE SHIELD | Admitting: Cardiology

## 2018-05-31 VITALS — BP 122/68 | HR 68 | Ht 62.0 in | Wt 135.0 lb

## 2018-05-31 DIAGNOSIS — E785 Hyperlipidemia, unspecified: Secondary | ICD-10-CM

## 2018-05-31 DIAGNOSIS — Z9861 Coronary angioplasty status: Principal | ICD-10-CM

## 2018-05-31 DIAGNOSIS — E088 Diabetes mellitus due to underlying condition with unspecified complications: Secondary | ICD-10-CM | POA: Insufficient documentation

## 2018-05-31 DIAGNOSIS — Z951 Presence of aortocoronary bypass graft: Secondary | ICD-10-CM | POA: Diagnosis not present

## 2018-05-31 DIAGNOSIS — I2510 Atherosclerotic heart disease of native coronary artery without angina pectoris: Secondary | ICD-10-CM

## 2018-05-31 DIAGNOSIS — I771 Stricture of artery: Secondary | ICD-10-CM

## 2018-05-31 HISTORY — DX: Diabetes mellitus due to underlying condition with unspecified complications: E08.8

## 2018-05-31 MED ORDER — RANOLAZINE ER 1000 MG PO TB12: 1000.0000 mg | ORAL_TABLET | Freq: Two times a day (BID) | ORAL | 2 refills | Status: DC

## 2018-05-31 MED ORDER — RANOLAZINE ER 500 MG PO TB12: 500.0000 mg | ORAL_TABLET | Freq: Two times a day (BID) | ORAL | 0 refills | Status: DC

## 2018-05-31 NOTE — Progress Notes (Signed)
Cardiology Office Note:    Date:  05/31/2018   ID:  Nicholas [name], DOB [birth year], MRN [id number]  PCP:  Nicholas Pickler, MD  Cardiologist:  Nicholas Brothers, MD   Referring MD: Nicholas [name], *    ASSESSMENT:    1. CAD S/P percutaneous coronary angioplasty   2. S/P CABG x 3   3. Dyslipidemia, goal LDL below 70   4. Diabetes mellitus due to underlying condition with unspecified complications (HCC)    PLAN:    In order of problems listed above:  1. Secondary prevention stressed with the patient.  Importance of compliance with diet and medication stressed and he vocalized understanding.  His blood pressure is stable.  There is not much room for him to be on ACE inhibitor at this time.  It was discontinued because of borderline blood pressure and symptoms suggesting of postural hypotension.  I cautioned him about changing posture and not to do things in an abrupt manner to avoid fall.  Fall precautions were risks explained.  He has never fallen down. 2. His ejection fraction was depressed.  Currently we will continue with this medication and he will be seen in the end of November.  Before he comes to see me he will get blood work including fasting lipids and an echocardiogram. 3. In view of the above he will get initiated on ranolazine 500 mg twice daily for 2 weeks and then 1 g twice daily subsequently.  He knows to go to the nearest emergency room for any significant concerns. 4. Sublingual nitroglycerin prescription was sent, its protocol and 911 protocol explained and the patient vocalized understanding questions were answered to the patient's satisfaction   Medication Adjustments/Labs and Tests Ordered: Current medicines are reviewed at length with the patient today.  Concerns regarding medicines are outlined above.  No orders of the defined types were placed in this encounter.  No orders of the defined types were placed in this encounter.    No chief complaint on  file.    History of Present Illness:    Nicholas [name] is a 62 y.o. male.  The patient has history of diabetes mellitus.  He underwent coronary angiography and bypass surgery many months ago.  Subsequently he started experiencing anginal chest pain and was readmitted to the hospital recently.  Unfortunately his grafts were not patent patient underwent coronary stenting after extensive consultation with the cardiothoracic surgery and interventional cardiology teams.  Patient mentions to me that since hospital discharge she is walking some on a regular basis without any symptoms.  No chest pain orthopnea or PND.  At the time of my evaluation, the patient is alert awake oriented and in no distress.  His daughter accompanies him for this visit today.  Past Medical History:  Diagnosis Date  . Coronary artery disease   . Heart murmur   . High cholesterol   . Hypertension   . NSTEMI (non-ST elevated myocardial infarction) (HCC) 05/14/2018  . PONV (postoperative nausea and vomiting)   . Type II diabetes mellitus (HCC)     Past Surgical History:  Procedure Laterality Date  . CATARACT EXTRACTION W/ INTRAOCULAR LENS  IMPLANT, BILATERAL Bilateral 2012-2017  . CORONARY ARTERY BYPASS GRAFT N/A 03/04/2018   Procedure: CORONARY ARTERY BYPASS GRAFTING (CABG) x 3: -FREE LIMA to LAD, -SVG to DIAGONAL, -SVG to OM; ENDOSCOPIC HARVEST GREATER SAPHENOUS VEIN: -Right Thigh  ;  Surgeon: Kerin Perna, MD;  Location[facility];  Service: Open Heart Surgery;  Laterality: N/A;  . CORONARY STENT INTERVENTION N/A 05/20/2018   Procedure: CORONARY STENT INTERVENTION;  Surgeon: Lyn Records, MD;  Location: [facility] INVASIVE CV LAB;  Service: Cardiovascular;  Laterality: N/A;  . EYE SURGERY    . INTRAVASCULAR PRESSURE WIRE/FFR STUDY N/A 03/01/2018   Procedure: INTRAVASCULAR PRESSURE WIRE/FFR STUDY;  Surgeon: Swazil[place], Peter M, MD;  Location: [facility] INVASIVE CV LAB;  Service: Cardiovascular;  Laterality: N/A;  . LEFT HEART CATH AND CORONARY  ANGIOGRAPHY N/A 03/01/2018   Procedure: LEFT HEART CATH AND CORONARY ANGIOGRAPHY;  Surgeon: Swaziland, Peter M, MD;  Location: [facility] INVASIVE CV LAB;  Service: Cardiovascular;  Laterality: N/A;  . LEFT HEART CATH AND CORS/GRAFTS ANGIOGRAPHY N/A 05/16/2018   Procedure: LEFT HEART CATH AND CORS/GRAFTS ANGIOGRAPHY;  Surgeon: Lennette Bihari, MD;  Location: [facility] INVASIVE CV LAB;  Service: Cardiovascular;  Laterality: N/A;  . TEE WITHOUT CARDIOVERSION N/A 03/04/2018   Procedure: TRANSESOPHAGEAL ECHOCARDIOGRAM (TEE);  Surgeon: Donata Clay, Theron Arista, MD;  Location: [facility] OR;  Service: Open Heart Surgery;  Laterality: N/A;    Current Medications: Current Meds  Medication Sig  . aspirin 81 MG tablet Take 1 tablet (81 mg total) by mouth daily.  Marland Kitchen atorvastatin (LIPITOR) 80 MG tablet Take 1 tablet (80 mg total) by mouth daily at 6 PM.  . clopidogrel (PLAVIX) 75 MG tablet Take 1 tablet (75 mg total) by mouth daily with breakfast.  . finasteride (PROSCAR) 5 MG tablet Take 5 mg by mouth daily.  . isosorbide mononitrate (IMDUR) 30 MG 24 hr tablet Take 1 tablet (30 mg total) by mouth daily.  . metFORMIN (GLUCOPHAGE) 500 MG tablet Take 500 mg by mouth 2 (two) times daily with a meal.  . metoprolol tartrate (LOPRESSOR) 25 MG tablet Take 0.5 tablets (12.5 mg total) by mouth 2 (two) times daily.  . nitroGLYCERIN (NITROSTAT) 0.4 MG SL tablet Place 1 tablet (0.4 mg total) under the tongue every 5 (five) minutes x 3 doses as needed for chest pain.     Allergies:   Eggs or egg-derived products   Social History   Socioeconomic History  . Marital status: Married    Spouse name: Not on file  . Number of children: Not on file  . Years of education: Not on file  . Highest education level: Not on file  Occupational History  . Not on file  Social Needs  . Financial resource strain: Not on file  . Food insecurity:    Worry: Not on file    Inability: Not on file  . Transportation needs:    Medical: Not on file    Non-medical: Not  on file  Tobacco Use  . Smoking status: Never Smoker  . Smokeless tobacco: Never Used  Substance and Sexual Activity  . Alcohol use: Not Currently    Frequency: Never    Comment: 05/20/2018 "stopped before 1999"  . Drug use: Never  . Sexual activity: Not on file  Lifestyle  . Physical activity:    Days per week: Not on file    Minutes per session: Not on file  . Stress: Not on file  Relationships  . Social connections:    Talks on phone: Not on file    Gets together: Not on file    Attends religious service: Not on file    Active member of club or organization: Not on file    Attends meetings of clubs or organizations: Not on file    Relationship status: Not on file  Other Topics  Concern  . Not on file  Social History Narrative  . Not on file     Family History: The patient's family history includes Heart disease in his mother.  ROS:   Please see the history of present illness.    All other systems reviewed and are negative.  EKGs/Labs/Other Studies Reviewed:    The following studies were reviewed today: Results of hospital admission, previous bypass surgery and extensive consultation and treatment and recent stay at the hospital was reviewed and discussed with the patient at length.   Recent Labs: 03/02/2018: TSH 1.150 03/05/2018: Magnesium 2.1 04/25/2018: ALT 25 05/21/2018: BUN 8; Creatinine, Ser 0.69; Hemoglobin 11.6; Platelets 258; Potassium 3.9; Sodium 137  Recent Lipid Panel    Component Value Date/Time   CHOL 124 04/25/2018 0908   TRIG 146 04/25/2018 0908   HDL 39 (L) 04/25/2018 0908   CHOLHDL 3.2 04/25/2018 0908   CHOLHDL 6.1 02/28/2018 0346   VLDL 55 (H) 02/28/2018 0346   LDLCALC 56 04/25/2018 0908    Physical Exam:    VS:  BP 122/68 (BP Location: Right Arm, Patient Position: Sitting, Cuff Size: Normal)   Pulse 68   Ht 5\' 2"  (1.575 m)   Wt 135 lb (61.2 kg)   SpO2 98%   BMI 24.69 kg/m     Wt Readings from Last 3 Encounters:  05/31/18 135 lb (61.2  kg)  05/29/18 135 lb (61.2 kg)  05/21/18 135 lb 1.6 oz (61.3 kg)     GEN: Patient is in no acute distress HEENT: Normal NECK: No JVD; No carotid bruits LYMPHATICS: No lymphadenopathy CARDIAC: Hear sounds regular, 2/6 systolic murmur at the apex. RESPIRATORY:  Clear to auscultation without rales, wheezing or rhonchi  ABDOMEN: Soft, non-tender, non-distended MUSCULOSKELETAL:  No edema; No deformity  SKIN: Warm and dry NEUROLOGIC:  Alert and oriented x 3 PSYCHIATRIC:  Normal affect   Signed, Nicholas Brothers, MD  05/31/2018 10:12 AM    [facility]

## 2018-05-31 NOTE — Patient Instructions (Addendum)
Medication Instructions:  Your physician has recommended you make the following change in your medication:   START ranexa 500 mg twice daily for 2 weeks then increase to 1,000 mg twice daily  If you need a refill on your cardiac medications before your next appointment, please call your pharmacy.   Lab work: Your physician recommends that you have the following labs drawn: Please return to the office fasting 2-3 days before your next visit to have BMP, liver and lipid panel completed. No appointment is needed for labs.  If you have labs (blood work) drawn today and your tests are completely normal, you will receive your results only by: Marland Kitchen MyChart Message (if you have MyChart) OR . A paper copy in the mail If you have any lab test that is abnormal or we need to change your treatment, we will call you to review the results.  Testing/Procedures: Your physician has requested that you have an echocardiogram. Echocardiography is a painless test that uses sound waves to create images of your heart. It provides your doctor with information about the size and shape of your heart and how well your heart's chambers and valves are working. This procedure takes approximately one hour. There are no restrictions for this procedure.   Follow-Up: At [facility], you and your health needs are our priority.  As part of our continuing mission to provide you with exceptional heart care, we have created designated Provider Care Teams.  These Care Teams include your primary Cardiologist (physician) and Advanced Practice Providers (APPs -  Physician Assistants and Nurse Practitioners) who all work together to provide you with the care you need, when you need it.  You will need a follow up appointment in November.  You may see one of our [facility] Provider Team in [place]: Gypsy Balsam, MD . Norman Herrlich, MD  Any Other Special Instructions Will Be Listed Below (If Applicable).  Ranolazine tablets,  extended release What is this medicine? RANOLAZINE (ra NOE la zeen) is a heart medicine. It is used to treat chronic chest pain (angina). This medicine must be taken regularly. It will not relieve an acute episode of chest pain. This medicine may be used for other purposes; ask your health care provider or pharmacist if you have questions. COMMON BRAND NAME(S): Ranexa What should I tell my health care provider before I take this medicine? They need to know if you have any of these conditions: -heart disease -irregular heartbeat -kidney disease -liver disease -low levels of potassium or magnesium in the blood -an unusual or allergic reaction to ranolazine, other medicines, foods, dyes, or preservatives -pregnant or trying to get pregnant -breast-feeding How should I use this medicine? Take this medicine by mouth with a glass of water. Follow the directions on the prescription label. Do not cut, crush, or chew this medicine. Take with or without food. Do not take this medication with grapefruit juice. Take your doses at regular intervals. Do not take your medicine more often then directed. Talk to your pediatrician regarding the use of this medicine in children. Special care may be needed. Overdosage: If you think you have taken too much of this medicine contact a poison control center or emergency room at once. NOTE: This medicine is only for you. Do not share this medicine with others. What if I miss a dose? If you miss a dose, take it as soon as you can. If it is almost time for your next dose, take only that dose.  Do not take double or extra doses. What may interact with this medicine? Do not take this medicine with any of the following medications: -antivirals for HIV or AIDS -cerivastatin -certain antibiotics like chloramphenicol, clarithromycin, dalfopristin; quinupristin, isoniazid, rifabutin, rifampin, rifapentine -certain medicines used for cancer like imatinib, nilotinib -certain  medicines for fungal infections like fluconazole, itraconazole, ketoconazole, posaconazole, voriconazole -certain medicines for irregular heart beat like dofetilide, dronedarone -certain medicines for seizures like carbamazepine, fosphenytoin, oxcarbazepine, phenobarbital, phenytoin -cisapride -conivaptan -cyclosporine -grapefruit or grapefruit juice -lumacaftor; ivacaftor -nefazodone -pimozide -quinacrine -St John's wort -thioridazine -ziprasidone This medicine may also interact with the following medications: -alfuzosin -certain medicines for depression, anxiety, or psychotic disturbances like bupropion, citalopram, fluoxetine, fluphenazine, paroxetine, perphenazine, risperidone, sertraline, trifluoperazine -certain medicines for cholesterol like atorvastatin, lovastatin, simvastatin -certain medicines for stomach problems like octreotide, palonosetron, prochlorperazine -eplerenone -ergot alkaloids like dihydroergotamine, ergonovine, ergotamine, methylergonovine -metformin -nicardipine -other medicines that prolong the QT interval (cause an abnormal heart rhythm) -sirolimus -tacrolimus This list may not describe all possible interactions. Give your health care provider a list of all the medicines, herbs, non-prescription drugs, or dietary supplements you use. Also tell them if you smoke, drink alcohol, or use illegal drugs. Some items may interact with your medicine. What should I watch for while using this medicine? Visit your doctor for regular check ups. Tell your doctor or healthcare professional if your symptoms do not start to get better or if they get worse. This medicine will not relieve an acute attack of angina or chest pain. This medicine can change your heart rhythm. Your health care provider may check your heart rhythm by ordering an electrocardiogram (ECG) while you are taking this medicine. You may get drowsy or dizzy. Do not drive, use machinery, or do anything that  needs mental alertness until you know how this medicine affects you. Do not stand or sit up quickly, especially if you are an older patient. This reduces the risk of dizzy or fainting spells. Alcohol may interfere with the effect of this medicine. Avoid alcoholic drinks. If you are scheduled for any medical or dental procedure, tell your healthcare provider that you are taking this medicine. This medicine can interact with other medicines used during surgery. What side effects may I notice from receiving this medicine? Side effects that you should report to your doctor or health care professional as soon as possible: -allergic reactions like skin rash, itching or hives, swelling of the face, lips, or tongue -breathing problems -changes in vision -fast, irregular or pounding heartbeat -feeling faint or lightheaded, falls -low or high blood pressure -numbness or tingling feelings -ringing in the ears -tremor or shakiness -slow heartbeat (fewer than 50 beats per minute) -swelling of the legs or feet Side effects that usually do not require medical attention (report to your doctor or health care professional if they continue or are bothersome): -constipation -drowsy -dry mouth -headache -nausea or vomiting -stomach upset This list may not describe all possible side effects. Call your doctor for medical advice about side effects. You may report side effects to FDA at 1-800-FDA-1088. Where should I keep my medicine? Keep out of the reach of children. Store at room temperature between 15 and 30 degrees C (59 and 86 degrees F). Throw away any unused medicine after the expiration date. NOTE: This sheet is a summary. It may not cover all possible information. If you have questions about this medicine, talk to your doctor, pharmacist, or health care provider.  2018 Elsevier/Gold Standard (2015-09-16 12:24:15)

## 2018-06-18 ENCOUNTER — Other Ambulatory Visit: Payer: Self-pay

## 2018-06-18 ENCOUNTER — Telehealth: Payer: Self-pay | Admitting: Cardiology

## 2018-06-18 MED ORDER — CLOPIDOGREL BISULFATE 75 MG PO TABS: 75.0000 mg | ORAL_TABLET | Freq: Every day | ORAL | 2 refills | Status: DC

## 2018-06-18 NOTE — Telephone Encounter (Signed)
Refill has been sent.  °

## 2018-06-18 NOTE — Telephone Encounter (Signed)
° °  1. Which medications need to be refilled? (please list name of each medication and dose if known) clopidogrel 75mg   2. Which pharmacy/location (including street and city if local pharmacy) is medication to be sent to? [facility] on [address]  3. Do they need a 30 day or 90 day supply? 90

## 2018-06-28 ENCOUNTER — Ambulatory Visit: Payer: Self-pay | Admitting: Cardiovascular Disease

## 2018-07-03 ENCOUNTER — Ambulatory Visit: Payer: Self-pay | Admitting: Cardiology

## 2018-07-03 ENCOUNTER — Ambulatory Visit (HOSPITAL_BASED_OUTPATIENT_CLINIC_OR_DEPARTMENT_OTHER)
Admission: RE | Admit: 2018-07-03 | Discharge: 2018-07-03 | Disposition: A | Discharge status: Home / Self Care | Payer: BLUE CROSS/BLUE SHIELD | Source: Ambulatory Visit | Attending: Cardiology | Admitting: Cardiology

## 2018-07-03 DIAGNOSIS — Z9861 Coronary angioplasty status: Secondary | ICD-10-CM | POA: Diagnosis not present

## 2018-07-03 DIAGNOSIS — I2510 Atherosclerotic heart disease of native coronary artery without angina pectoris: Secondary | ICD-10-CM

## 2018-07-03 NOTE — Progress Notes (Signed)
  Echocardiogram 2D Echocardiogram has been performed.  Dillard Pascal T Laiken Sandy 07/03/2018, 4:18 PM

## 2018-07-05 LAB — BASIC METABOLIC PANEL
BUN/Creatinine Ratio: 12 (ref 10–24)
BUN: 12 mg/dL (ref 8–27)
CALCIUM: 9.6 mg/dL (ref 8.6–10.2)
CO2: 22 mmol/L (ref 20–29)
Chloride: 102 mmol/L (ref 96–106)
Creatinine, Ser: 0.99 mg/dL (ref 0.76–1.27)
GFR calc Af Amer: 93 mL/min/{1.73_m2} (ref >59)
GFR, EST NON AFRICAN AMERICAN: 80 mL/min/{1.73_m2} (ref >59)
Glucose: 108 mg/dL — ABNORMAL HIGH (ref 65–99)
Potassium: 4.8 mmol/L (ref 3.5–5.2)
Sodium: 139 mmol/L (ref 134–144)

## 2018-07-05 LAB — LIPID PANEL
CHOLESTEROL TOTAL: 106 mg/dL (ref 100–199)
Chol/HDL Ratio: 2.9 ratio (ref 0.0–5.0)
HDL: 37 mg/dL — ABNORMAL LOW (ref >39)
LDL Calculated: 50 mg/dL (ref 0–99)
Triglycerides: 94 mg/dL (ref 0–149)
VLDL Cholesterol Cal: 19 mg/dL (ref 5–40)

## 2018-07-09 ENCOUNTER — Ambulatory Visit (INDEPENDENT_AMBULATORY_CARE_PROVIDER_SITE_OTHER): Payer: BLUE CROSS/BLUE SHIELD | Admitting: Cardiology

## 2018-07-09 ENCOUNTER — Encounter: Payer: Self-pay | Admitting: Cardiology

## 2018-07-09 VITALS — BP 130/70 | HR 62 | Ht 62.0 in | Wt 136.0 lb

## 2018-07-09 DIAGNOSIS — E088 Diabetes mellitus due to underlying condition with unspecified complications: Secondary | ICD-10-CM

## 2018-07-09 DIAGNOSIS — Z9861 Coronary angioplasty status: Secondary | ICD-10-CM

## 2018-07-09 DIAGNOSIS — Z951 Presence of aortocoronary bypass graft: Secondary | ICD-10-CM | POA: Diagnosis not present

## 2018-07-09 DIAGNOSIS — I2510 Atherosclerotic heart disease of native coronary artery without angina pectoris: Secondary | ICD-10-CM

## 2018-07-09 DIAGNOSIS — E785 Hyperlipidemia, unspecified: Secondary | ICD-10-CM

## 2018-07-09 NOTE — Patient Instructions (Signed)
Medication Instructions:  Your physician recommends that you continue on your current medications as directed. Please refer to the Current Medication list given to you today.  If you need a refill on your cardiac medications before your next appointment, please call your pharmacy.   Lab work: None  If you have labs (blood work) drawn today and your tests are completely normal, you will receive your results only by: Marland Kitchen MyChart Message (if you have MyChart) OR . A paper copy in the mail If you have any lab test that is abnormal or we need to change your treatment, we will call you to review the results.  Testing/Procedures: None  Follow-Up: At [facility], you and your health needs are our priority.  As part of our continuing mission to provide you with exceptional heart care, we have created designated Provider Care Teams.  These Care Teams include your primary Cardiologist (physician) and Advanced Practice Providers (APPs -  Physician Assistants and Nurse Practitioners) who all work together to provide you with the care you need, when you need it.  You will need a follow up appointment in the beginning of January.  Please call our office 2 months in advance to schedule this appointment.  You may see another member of our [employer] Provider Team in [place]: Gypsy Balsam, MD . Norman Herrlich, MD  Any Other Special Instructions Will Be Listed Below (If Applicable).

## 2018-07-09 NOTE — Progress Notes (Signed)
Cardiology Office Note:    Date:  07/09/2018   ID:  Nicholas [name], DOB 18-Sep-1955, MRN [id number]  PCP:  Anselmo PicklerAchreja, Manjeet Kaur, MD  Cardiologist:  Garwin Brothersajan R Jensine Luz, MD   Referring MD: Anselmo PicklerAchreja, Manjeet Kaur, *    ASSESSMENT:    1. CAD S/P percutaneous coronary angioplasty   2. Dyslipidemia, goal LDL below 70   3. S/P CABG x 3   4. Diabetes mellitus due to underlying condition with unspecified complications (HCC)    PLAN:    In order of problems listed above:  1. Secondary prevention stressed with the patient.  Importance of compliance with diet and medication stressed and he vocalized understanding.  His blood pressure is stable.  Diet was discussed for dyslipidemia and the importance of regular exercise stressed. 2. Patient's has questions about disability.  The kind of work he does there is no light duty and he mentions to me that he has to lift about 70 pounds on a routine basis.  I discussed this with him at length.  I will have a discussion about this with Dr. Evlyn Clineshomas Kelly's interventionalist.  It is possible that his case will have to be discussed with the surgeon who attended on him while he was at the hospital recently.  I would like to hear from them about their opinion and let the patient know. 3. He will be seen in follow-up appointment in the first week of January or earlier if he has any concerns.   Medication Adjustments/Labs and Tests Ordered: Current medicines are reviewed at length with the patient today.  Concerns regarding medicines are outlined above.  No orders of the defined types were placed in this encounter.  No orders of the defined types were placed in this encounter.    No chief complaint on file.    History of Present Illness:    Nicholas BignessGurdev [name] is a 62 y.o. male..  Patient has had history of coronary artery disease.  Soon after that his grafts shutdown and patient [facility] and he underwent coronary  stenting.  He denies any problems at this time.  He tries to exercise strong.  I have stopped his isosorbide and initiated him on Ranexa 1 mg twice daily.  He is trying to increase his ambulation.  At the time of my evaluation, the patient is alert awake oriented and in no distress.  Past Medical History:  Diagnosis Date  . Coronary artery disease   . Heart murmur   . High cholesterol   . Hypertension   . NSTEMI (non-ST elevated myocardial infarction) (HCC) 05/14/2018  . PONV (postoperative nausea and vomiting)   . Type II diabetes mellitus (HCC)     Past Surgical History:  Procedure Laterality Date  . CATARACT EXTRACTION W/ INTRAOCULAR LENS  IMPLANT, BILATERAL Bilateral 2012-2017  . CORONARY ARTERY BYPASS GRAFT N/A 03/04/2018   Procedure: CORONARY ARTERY BYPASS GRAFTING (CABG) x 3: -FREE LIMA to LAD, -SVG to DIAGONAL, -SVG to OM; ENDOSCOPIC HARVEST GREATER SAPHENOUS VEIN: -Right Thigh  ;  Surgeon: Kerin PernaVan Trigt, Peter, MD;  [facility];  Service: Open Heart Surgery;  Laterality: N/A;  . CORONARY STENT INTERVENTION N/A 05/20/2018   Procedure: CORONARY STENT INTERVENTION;  Surgeon: Lyn RecordsSmith, Henry W, MD;  Location: [facility] INVASIVE CV LAB;  Service: Cardiovascular;  Laterality: N/A;  . EYE SURGERY    . INTRAVASCULAR PRESSURE WIRE/FFR STUDY N/A 03/01/2018   Procedure: INTRAVASCULAR PRESSURE WIRE/FFR STUDY;  Surgeon: SwazilandJordan, Peter M,  MD;  Location: [facility] INVASIVE CV LAB;  Service: Cardiovascular;  Laterality: N/A;  . LEFT HEART CATH AND CORONARY ANGIOGRAPHY N/A 03/01/2018   Procedure: LEFT HEART CATH AND CORONARY ANGIOGRAPHY;  Surgeon: Swaziland, Peter M, MD;  Location: [facility] INVASIVE CV LAB;  Service: Cardiovascular;  Laterality: N/A;  . LEFT HEART CATH AND CORS/GRAFTS ANGIOGRAPHY N/A 05/16/2018   Procedure: LEFT HEART CATH AND CORS/GRAFTS ANGIOGRAPHY;  Surgeon: Lennette Bihari, MD;  Location: [facility] INVASIVE CV LAB;  Service: Cardiovascular;  Laterality: N/A;  . TEE WITHOUT CARDIOVERSION N/A 03/04/2018   Procedure:  TRANSESOPHAGEAL ECHOCARDIOGRAM (TEE);  Surgeon: Donata Clay, Theron Arista, MD;  Location: [facility] OR;  Service: Open Heart Surgery;  Laterality: N/A;    Current Medications: Current Meds  Medication Sig  . aspirin 81 MG tablet Take 1 tablet (81 mg total) by mouth daily.  Marland Kitchen atorvastatin (LIPITOR) 80 MG tablet Take 1 tablet (80 mg total) by mouth daily at 6 PM.  . clopidogrel (PLAVIX) 75 MG tablet Take 1 tablet (75 mg total) by mouth daily with breakfast.  . finasteride (PROSCAR) 5 MG tablet Take 5 mg by mouth daily.  . isosorbide mononitrate (IMDUR) 30 MG 24 hr tablet Take 1 tablet (30 mg total) by mouth daily.  . metFORMIN (GLUCOPHAGE) 500 MG tablet Take 500 mg by mouth 2 (two) times daily with a meal.  . metoprolol tartrate (LOPRESSOR) 25 MG tablet Take 0.5 tablets (12.5 mg total) by mouth 2 (two) times daily.  . nitroGLYCERIN (NITROSTAT) 0.4 MG SL tablet Place 1 tablet (0.4 mg total) under the tongue every 5 (five) minutes x 3 doses as needed for chest pain.  . ranolazine (RANEXA) 1000 MG SR tablet Take 1 tablet (1,000 mg total) by mouth 2 (two) times daily.     Allergies:   Eggs or egg-derived products   Social History   Socioeconomic History  . Marital status: Married    Spouse name: Not on file  . Number of children: Not on file  . Years of education: Not on file  . Highest education level: Not on file  Occupational History  . Not on file  Social Needs  . Financial resource strain: Not on file  . Food insecurity:    Worry: Not on file    Inability: Not on file  . Transportation needs:    Medical: Not on file    Non-medical: Not on file  Tobacco Use  . Smoking status: Never Smoker  . Smokeless tobacco: Never Used  Substance and Sexual Activity  . Alcohol use: Not Currently    Frequency: Never    Comment: 05/20/2018 "stopped before 1999"  . Drug use: Never  . Sexual activity: Not on file  Lifestyle  . Physical activity:    Days per week: Not on file    Minutes per session: Not on  file  . Stress: Not on file  Relationships  . Social connections:    Talks on phone: Not on file    Gets together: Not on file    Attends religious service: Not on file    Active member of club or organization: Not on file    Attends meetings of clubs or organizations: Not on file    Relationship status: Not on file  Other Topics Concern  . Not on file  Social History Narrative  . Not on file     Family History: The patient's family history includes Heart disease in his mother.  ROS:   Please see the history  of present illness.    All other systems reviewed and are negative.  EKGs/Labs/Other Studies Reviewed:    The following studies were reviewed today: I discussed my findings with the patient at extensive length.   Recent Labs: 03/02/2018: TSH 1.150 03/05/2018: Magnesium 2.1 04/25/2018: ALT 25 05/21/2018: Hemoglobin 11.6; Platelets 258 07/04/2018: BUN 12; Creatinine, Ser 0.99; Potassium 4.8; Sodium 139  Recent Lipid Panel    Component Value Date/Time   CHOL 106 07/04/2018 0808   TRIG 94 07/04/2018 0808   HDL 37 (L) 07/04/2018 0808   CHOLHDL 2.9 07/04/2018 0808   CHOLHDL 6.1 02/28/2018 0346   VLDL 55 (H) 02/28/2018 0346   LDLCALC 50 07/04/2018 0808    Physical Exam:    VS:  BP 130/70 (BP Location: Right Arm, Patient Position: Sitting, Cuff Size: Normal)   Pulse 62   Ht 5\' 2"  (1.575 m)   Wt 136 lb (61.7 kg)   SpO2 98%   BMI 24.87 kg/m     Wt Readings from Last 3 Encounters:  07/09/18 136 lb (61.7 kg)  05/31/18 135 lb (61.2 kg)  05/29/18 135 lb (61.2 kg)     GEN: Patient is in no acute distress HEENT: Normal NECK: No JVD; No carotid bruits LYMPHATICS: No lymphadenopathy CARDIAC: Hear sounds regular, 2/6 systolic murmur at the apex. RESPIRATORY:  Clear to auscultation without rales, wheezing or rhonchi  ABDOMEN: Soft, non-tender, non-distended MUSCULOSKELETAL:  No edema; No deformity  SKIN: Warm and dry NEUROLOGIC:  Alert and oriented x 3 PSYCHIATRIC:   Normal affect   Signed, Garwin Brothers, MD  07/09/2018 9:25 AM    [facility]

## 2018-07-15 ENCOUNTER — Encounter (HOSPITAL_COMMUNITY): Payer: Self-pay | Admitting: Emergency Medicine

## 2018-07-15 ENCOUNTER — Other Ambulatory Visit: Payer: Self-pay

## 2018-07-15 ENCOUNTER — Emergency Department (HOSPITAL_COMMUNITY): Payer: BLUE CROSS/BLUE SHIELD

## 2018-07-15 ENCOUNTER — Observation Stay (HOSPITAL_COMMUNITY)
Admission: EM | Admit: 2018-07-15 | Discharge: 2018-07-15 | Disposition: A | Discharge status: Home / Self Care | Payer: BLUE CROSS/BLUE SHIELD | Attending: Internal Medicine | Admitting: Internal Medicine

## 2018-07-15 ENCOUNTER — Observation Stay (HOSPITAL_COMMUNITY): Payer: BLUE CROSS/BLUE SHIELD

## 2018-07-15 ENCOUNTER — Observation Stay (HOSPITAL_BASED_OUTPATIENT_CLINIC_OR_DEPARTMENT_OTHER): Payer: BLUE CROSS/BLUE SHIELD

## 2018-07-15 DIAGNOSIS — I10 Essential (primary) hypertension: Secondary | ICD-10-CM | POA: Diagnosis present

## 2018-07-15 DIAGNOSIS — I252 Old myocardial infarction: Secondary | ICD-10-CM | POA: Diagnosis not present

## 2018-07-15 DIAGNOSIS — I771 Stricture of artery: Secondary | ICD-10-CM | POA: Diagnosis not present

## 2018-07-15 DIAGNOSIS — I2583 Coronary atherosclerosis due to lipid rich plaque: Secondary | ICD-10-CM

## 2018-07-15 DIAGNOSIS — Z955 Presence of coronary angioplasty implant and graft: Secondary | ICD-10-CM | POA: Insufficient documentation

## 2018-07-15 DIAGNOSIS — I708 Atherosclerosis of other arteries: Secondary | ICD-10-CM | POA: Insufficient documentation

## 2018-07-15 DIAGNOSIS — Z7984 Long term (current) use of oral hypoglycemic drugs: Secondary | ICD-10-CM | POA: Diagnosis not present

## 2018-07-15 DIAGNOSIS — Z951 Presence of aortocoronary bypass graft: Secondary | ICD-10-CM | POA: Diagnosis not present

## 2018-07-15 DIAGNOSIS — R918 Other nonspecific abnormal finding of lung field: Secondary | ICD-10-CM | POA: Diagnosis present

## 2018-07-15 DIAGNOSIS — I110 Hypertensive heart disease with heart failure: Secondary | ICD-10-CM | POA: Diagnosis not present

## 2018-07-15 DIAGNOSIS — Z8249 Family history of ischemic heart disease and other diseases of the circulatory system: Secondary | ICD-10-CM | POA: Insufficient documentation

## 2018-07-15 DIAGNOSIS — I5022 Chronic systolic (congestive) heart failure: Secondary | ICD-10-CM | POA: Diagnosis present

## 2018-07-15 DIAGNOSIS — E785 Hyperlipidemia, unspecified: Secondary | ICD-10-CM | POA: Diagnosis present

## 2018-07-15 DIAGNOSIS — N400 Enlarged prostate without lower urinary tract symptoms: Secondary | ICD-10-CM | POA: Diagnosis not present

## 2018-07-15 DIAGNOSIS — Z7902 Long term (current) use of antithrombotics/antiplatelets: Secondary | ICD-10-CM | POA: Diagnosis not present

## 2018-07-15 DIAGNOSIS — I2510 Atherosclerotic heart disease of native coronary artery without angina pectoris: Secondary | ICD-10-CM

## 2018-07-15 DIAGNOSIS — R55 Syncope and collapse: Secondary | ICD-10-CM | POA: Diagnosis present

## 2018-07-15 DIAGNOSIS — Z7982 Long term (current) use of aspirin: Secondary | ICD-10-CM | POA: Insufficient documentation

## 2018-07-15 DIAGNOSIS — E119 Type 2 diabetes mellitus without complications: Secondary | ICD-10-CM | POA: Diagnosis not present

## 2018-07-15 DIAGNOSIS — Z8673 Personal history of transient ischemic attack (TIA), and cerebral infarction without residual deficits: Secondary | ICD-10-CM | POA: Insufficient documentation

## 2018-07-15 DIAGNOSIS — Z79899 Other long term (current) drug therapy: Secondary | ICD-10-CM | POA: Diagnosis not present

## 2018-07-15 HISTORY — DX: Syncope and collapse: R55

## 2018-07-15 HISTORY — DX: Atherosclerotic heart disease of native coronary artery without angina pectoris: I25.10

## 2018-07-15 HISTORY — DX: Other nonspecific abnormal finding of lung field: R91.8

## 2018-07-15 HISTORY — DX: Chronic systolic (congestive) heart failure: I50.22

## 2018-07-15 HISTORY — DX: Essential (primary) hypertension: I10

## 2018-07-15 LAB — COMPREHENSIVE METABOLIC PANEL
ALK PHOS: 65 U/L (ref 38–126)
ALT: 21 U/L (ref 0–44)
ANION GAP: 5 (ref 5–15)
AST: 19 U/L (ref 15–41)
Albumin: 3.7 g/dL (ref 3.5–5.0)
BILIRUBIN TOTAL: 0.5 mg/dL (ref 0.3–1.2)
BUN: 14 mg/dL (ref 8–23)
CALCIUM: 8.7 mg/dL — AB (ref 8.9–10.3)
CO2: 24 mmol/L (ref 22–32)
Chloride: 103 mmol/L (ref 98–111)
Creatinine, Ser: 0.85 mg/dL (ref 0.61–1.24)
Glucose, Bld: 130 mg/dL — ABNORMAL HIGH (ref 70–99)
Potassium: 3.5 mmol/L (ref 3.5–5.1)
Sodium: 132 mmol/L — ABNORMAL LOW (ref 135–145)
TOTAL PROTEIN: 6.6 g/dL (ref 6.5–8.1)

## 2018-07-15 LAB — CBC WITH DIFFERENTIAL/PLATELET
Abs Immature Granulocytes: 0.01 10*3/uL (ref 0.00–0.07)
Basophils Absolute: 0.1 10*3/uL (ref 0.0–0.1)
Basophils Relative: 1 %
EOS PCT: 2 %
Eosinophils Absolute: 0.2 10*3/uL (ref 0.0–0.5)
HEMATOCRIT: 40.3 % (ref 39.0–52.0)
Hemoglobin: 12.5 g/dL — ABNORMAL LOW (ref 13.0–17.0)
Immature Granulocytes: 0 %
Lymphocytes Relative: 47 %
Lymphs Abs: 3 10*3/uL (ref 0.7–4.0)
MCH: 24.9 pg — AB (ref 26.0–34.0)
MCHC: 31 g/dL (ref 30.0–36.0)
MCV: 80.1 fL (ref 80.0–100.0)
MONO ABS: 0.4 10*3/uL (ref 0.1–1.0)
MONOS PCT: 7 %
Neutro Abs: 2.8 10*3/uL (ref 1.7–7.7)
Neutrophils Relative %: 43 %
Platelets: 208 10*3/uL (ref 150–400)
RBC: 5.03 MIL/uL (ref 4.22–5.81)
RDW: 16.8 % — ABNORMAL HIGH (ref 11.5–15.5)
WBC: 6.4 10*3/uL (ref 4.0–10.5)
nRBC: 0 % (ref 0.0–0.2)

## 2018-07-15 LAB — RAPID URINE DRUG SCREEN, HOSP PERFORMED
AMPHETAMINES: NOT DETECTED
Barbiturates: NOT DETECTED
Benzodiazepines: NOT DETECTED
Cocaine: NOT DETECTED
OPIATES: NOT DETECTED
Tetrahydrocannabinol: NOT DETECTED

## 2018-07-15 LAB — D-DIMER, QUANTITATIVE: D-Dimer, Quant: 0.39 ug/mL-FEU (ref 0.00–0.50)

## 2018-07-15 LAB — GLUCOSE, CAPILLARY: Glucose-Capillary: 180 mg/dL — ABNORMAL HIGH (ref 70–99)

## 2018-07-15 LAB — PROTIME-INR
INR: 0.99
PROTHROMBIN TIME: 13 s (ref 11.4–15.2)

## 2018-07-15 LAB — TROPONIN I: Troponin I: <0.03 ng/mL (ref <0.03)

## 2018-07-15 LAB — BRAIN NATRIURETIC PEPTIDE: B Natriuretic Peptide: 141.6 pg/mL — ABNORMAL HIGH (ref 0.0–100.0)

## 2018-07-15 MED ORDER — RANOLAZINE ER 500 MG PO TB12
1000.0000 mg | ORAL_TABLET | Freq: Two times a day (BID) | ORAL | Status: DC
  Filled 2018-07-15 (×2): qty 2

## 2018-07-15 MED ORDER — ACETAMINOPHEN 650 MG RE SUPP: 650.0000 mg | Freq: Four times a day (QID) | RECTAL | Status: DC | PRN

## 2018-07-15 MED ORDER — ATORVASTATIN CALCIUM 80 MG PO TABS: 80.0000 mg | ORAL_TABLET | Freq: Every day | ORAL | Status: DC

## 2018-07-15 MED ORDER — FINASTERIDE 5 MG PO TABS
5.0000 mg | ORAL_TABLET | Freq: Every day | ORAL | Status: DC
  Filled 2018-07-15 (×2): qty 1

## 2018-07-15 MED ORDER — INSULIN ASPART 100 UNIT/ML ~~LOC~~ SOLN: 0.0000 [IU] | Freq: Every day | SUBCUTANEOUS | Status: DC

## 2018-07-15 MED ORDER — ENOXAPARIN SODIUM 40 MG/0.4ML ~~LOC~~ SOLN: 40.0000 mg | SUBCUTANEOUS | Status: DC

## 2018-07-15 MED ORDER — SENNOSIDES-DOCUSATE SODIUM 8.6-50 MG PO TABS: 1.0000 | ORAL_TABLET | Freq: Every evening | ORAL | Status: DC | PRN

## 2018-07-15 MED ORDER — INSULIN ASPART 100 UNIT/ML ~~LOC~~ SOLN: 0.0000 [IU] | Freq: Three times a day (TID) | SUBCUTANEOUS | Status: DC

## 2018-07-15 MED ORDER — ACETAMINOPHEN 325 MG PO TABS: 650.0000 mg | ORAL_TABLET | Freq: Four times a day (QID) | ORAL | Status: DC | PRN

## 2018-07-15 MED ORDER — SODIUM CHLORIDE 0.9 % IV SOLN
INTRAVENOUS | Status: DC
  Administered 2018-07-15: 07:00:00 via INTRAVENOUS

## 2018-07-15 MED ORDER — SODIUM CHLORIDE 0.9% FLUSH: 3.0000 mL | Freq: Two times a day (BID) | INTRAVENOUS | Status: DC

## 2018-07-15 MED ORDER — IOPAMIDOL (ISOVUE-370) INJECTION 76%
INTRAVENOUS | Status: AC
  Filled 2018-07-15: qty 100

## 2018-07-15 MED ORDER — ONDANSETRON HCL 4 MG PO TABS: 4.0000 mg | ORAL_TABLET | Freq: Four times a day (QID) | ORAL | Status: DC | PRN

## 2018-07-15 MED ORDER — POLYETHYLENE GLYCOL 3350 17 G PO PACK: 17.0000 g | PACK | Freq: Every day | ORAL | Status: DC

## 2018-07-15 MED ORDER — IOPAMIDOL (ISOVUE-370) INJECTION 76%
100.0000 mL | Freq: Once | INTRAVENOUS | Status: AC | PRN
  Administered 2018-07-15: 100 mL via INTRAVENOUS

## 2018-07-15 MED ORDER — CLOPIDOGREL BISULFATE 75 MG PO TABS
75.0000 mg | ORAL_TABLET | Freq: Every day | ORAL | Status: DC
  Administered 2018-07-15: 75 mg via ORAL
  Filled 2018-07-15: qty 1

## 2018-07-15 MED ORDER — SODIUM CHLORIDE 0.9 % IV BOLUS
500.0000 mL | Freq: Once | INTRAVENOUS | Status: AC
  Administered 2018-07-15: 500 mL via INTRAVENOUS

## 2018-07-15 MED ORDER — ASPIRIN EC 81 MG PO TBEC
81.0000 mg | DELAYED_RELEASE_TABLET | Freq: Every day | ORAL | Status: DC
  Administered 2018-07-15: 81 mg via ORAL
  Filled 2018-07-15: qty 1

## 2018-07-15 MED ORDER — NITROGLYCERIN 0.4 MG SL SUBL: 0.4000 mg | SUBLINGUAL_TABLET | SUBLINGUAL | Status: DC | PRN

## 2018-07-15 MED ORDER — METOPROLOL TARTRATE 12.5 MG HALF TABLET
12.5000 mg | ORAL_TABLET | Freq: Two times a day (BID) | ORAL | Status: DC
  Administered 2018-07-15: 12.5 mg via ORAL
  Filled 2018-07-15: qty 1

## 2018-07-15 MED ORDER — ONDANSETRON HCL 4 MG/2ML IJ SOLN: 4.0000 mg | Freq: Four times a day (QID) | INTRAMUSCULAR | Status: DC | PRN

## 2018-07-15 NOTE — ED Notes (Signed)
Pt alert and oriented x4. Ambulatory with assistance. Equal upper and lower strength bilaterally. Pt states has intermittent chest discomfort. No nausea or SOB with chest discomfort. Family at bedside. Family and patient educated about plan of care. Waiting for consulting provider.

## 2018-07-15 NOTE — Progress Notes (Signed)
VASCULAR LAB PRELIMINARY  PRELIMINARY  PRELIMINARY  PRELIMINARY  Carotid duplex completed.    Preliminary report:  60-79% right ICA stenosis.  40-59% left ICA stenosis.  Vertebral artery flow is antegrade. No significant change since study done 03/01/18.  Herlinda Heady, RVT 07/15/2018, 9:27 AM

## 2018-07-15 NOTE — ED Triage Notes (Signed)
Per EMS patient began having chest pain tonight after waking up, wife gave him 1 nitroglycerin tablet with relief, patient then got up to the bathroom and felt dizzy and fell backwards-pt states no LOC but unsure if he hit his head. EMS tried to stand him up and patient turned pale and was dizzy. CP 3/10 at triage, NAD. VSS.

## 2018-07-15 NOTE — ED Notes (Signed)
Patient transported to CT 

## 2018-07-15 NOTE — ED Provider Notes (Signed)
[facility] EMERGENCY DEPARTMENT Provider Note   CSN: [id number] Arrival date & time: 07/15/18  0142     History   Chief Complaint Chief Complaint  Patient presents with  . Dizziness    HPI Nicholas [name] is a 62 y.o. male.  Patient with history of CAD status post CABG and ischemic cardiomyopathy presenting from home with episode of syncope.  Family states he went out to eat and patient fell asleep early.  He walked to go to the bathroom without a problem.  After using the bathroom patient was brushing his teeth and then passed out without warning.  Contrary to triage note there was no chest pain before this.  Patient fell backwards and hit his head.  He denies any injury from the fall.  He has some soreness in his chest now that is worse with palpation.  It is dissimilar to his previous cardiac type pain.  He denies any focal weakness, numbness or tingling.  There is no prodrome prior to his syncope.  He denies any focal weakness, numbness, tingling, bowel or bladder incontinence, tongue biting.  The history is provided by the patient, the EMS personnel and a relative.  Dizziness  Associated symptoms: nausea   Associated symptoms: no chest pain and no shortness of breath     Past Medical History:  Diagnosis Date  . Coronary artery disease   . Heart murmur   . High cholesterol   . Hypertension   . NSTEMI (non-ST elevated myocardial infarction) (HCC) 05/14/2018  . PONV (postoperative nausea and vomiting)   . Type II diabetes mellitus [facility])     Patient Active Problem List   Diagnosis Date Noted  . Diabetes mellitus due to underlying condition with unspecified complications (HCC) 05/31/2018  . CAD S/P percutaneous coronary angioplasty 05/29/2018  . Non-insulin dependent type 2 diabetes mellitus (HCC) 03/27/2018  . Dyslipidemia, goal LDL below 70 03/27/2018  . 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  . NSTEMI (non-ST elevated myocardial infarction) (HCC) 02/28/2018  .  Stricture of artery (HCC) 02/02/2012  . Subclavian arterial stenosis (HCC) 01/19/2012    Past Surgical History:  Procedure Laterality Date  . CATARACT EXTRACTION W/ INTRAOCULAR LENS  IMPLANT, BILATERAL Bilateral 2012-2017  . CORONARY ARTERY BYPASS GRAFT N/A 03/04/2018   Procedure: CORONARY ARTERY BYPASS GRAFTING (CABG) x 3: -FREE LIMA to LAD, -SVG to DIAGONAL, -SVG to OM; ENDOSCOPIC HARVEST GREATER SAPHENOUS VEIN: -Right Thigh  ;  Surgeon: Kerin Perna, MD;  Location: [facility] OR;  Service: Open Heart Surgery;  Laterality: N/A;  . CORONARY STENT INTERVENTION N/A 05/20/2018   Procedure: CORONARY STENT INTERVENTION;  Surgeon: Lyn Records, MD;  Location: [facility] INVASIVE CV LAB;  Service: Cardiovascular;  Laterality: N/A;  . EYE SURGERY    . INTRAVASCULAR PRESSURE WIRE/FFR STUDY N/A 03/01/2018   Procedure: INTRAVASCULAR PRESSURE WIRE/FFR STUDY;  Surgeon: Swaziland, Peter M, MD;  Location: [facility] INVASIVE CV LAB;  Service: Cardiovascular;  Laterality: N/A;  . LEFT HEART CATH AND CORONARY ANGIOGRAPHY N/A 03/01/2018   Procedure: LEFT HEART CATH AND CORONARY ANGIOGRAPHY;  Surgeon: Swaziland, Peter M, MD;  Location: [facility] INVASIVE CV LAB;  Service: Cardiovascular;  Laterality: N/A;  . LEFT HEART CATH AND CORS/GRAFTS ANGIOGRAPHY N/A 05/16/2018   Procedure: LEFT HEART CATH AND CORS/GRAFTS ANGIOGRAPHY;  Surgeon: Lennette Bihari, MD;  Location: [facility] INVASIVE CV LAB;  Service: Cardiovascular;  Laterality: N/A;  . TEE WITHOUT CARDIOVERSION N/A 03/04/2018   Procedure: TRANSESOPHAGEAL ECHOCARDIOGRAM (TEE);  Surgeon: Donata Clay, Theron Arista, MD;  Location: [facility] OR;  Service: Open Heart Surgery;  Laterality: N/A;        Home Medications    Prior to Admission medications   Medication Sig Start Date End Date Taking? Authorizing Provider  aspirin 81 MG tablet Take 1 tablet (81 mg total) by mouth daily. 05/21/18   Barrett, Joline Salt, PA-C  atorvastatin (LIPITOR) 80 MG tablet Take 1 tablet (80 mg total) by mouth daily at 6 PM. 05/08/18   Donata Clay,  Theron Arista, MD  clopidogrel (PLAVIX) 75 MG tablet Take 1 tablet (75 mg total) by mouth daily with breakfast. 06/18/18   Revankar, Aundra Dubin, MD  finasteride (PROSCAR) 5 MG tablet Take 5 mg by mouth daily.    [provider]  metFORMIN (GLUCOPHAGE) 500 MG tablet Take 500 mg by mouth 2 (two) times daily with a meal.    [provider]  metoprolol tartrate (LOPRESSOR) 25 MG tablet Take 0.5 tablets (12.5 mg total) by mouth 2 (two) times daily. 05/21/18   Barrett, Joline Salt, PA-C  nitroGLYCERIN (NITROSTAT) 0.4 MG SL tablet Place 1 tablet (0.4 mg total) under the tongue every 5 (five) minutes x 3 doses as needed for chest pain. 05/21/18   Barrett, Joline Salt, PA-C  ranolazine (RANEXA) 1000 MG SR tablet Take 1 tablet (1,000 mg total) by mouth 2 (two) times daily. 05/31/18   Revankar, Aundra Dubin, MD    Family History Family History  Problem Relation Age of Onset  . Heart disease Mother     Social History Social History   Tobacco Use  . Smoking status: Never Smoker  . Smokeless tobacco: Never Used  Substance Use Topics  . Alcohol use: Not Currently    Frequency: Never    Comment: 05/20/2018 "stopped before 1999"  . Drug use: Never     Allergies   Eggs or egg-derived products   Review of Systems Review of Systems  Constitutional: Negative for activity change, appetite change and fever.  HENT: Negative for congestion and postnasal drip.   Eyes: Negative for visual disturbance.  Respiratory: Positive for chest tightness. Negative for cough and shortness of breath.   Cardiovascular: Negative for chest pain.  Gastrointestinal: Positive for nausea.  Genitourinary: Positive for dysuria. Negative for frequency, testicular pain and urgency.  Musculoskeletal: Negative for arthralgias and myalgias.  Skin: Negative for rash.  Neurological: Positive for dizziness and syncope.    all other systems are negative except as noted in the HPI and PMH.    Physical Exam Updated Vital Signs BP  121/64   Pulse (!) 57   Resp 18   Ht 5\' 2"  (1.575 m)   Wt 61.7 kg   SpO2 99%   BMI 24.87 kg/m   Physical Exam  Constitutional: He is oriented to person, place, and time. He appears well-developed and well-nourished. No distress.  HENT:  Head: Normocephalic and atraumatic.  Mouth/Throat: Oropharynx is clear and moist. No oropharyngeal exudate.  Eyes: Pupils are equal, round, and reactive to light. Conjunctivae and EOM are normal.  Neck: Normal range of motion. Neck supple.  No meningismus.  Cardiovascular: Normal rate, regular rhythm and intact distal pulses.  Murmur heard. Decreased right radial pulse compared to left.  Intact DP and PT pulses bilaterally  Pulmonary/Chest: Effort normal and breath sounds normal. No respiratory distress. He exhibits no tenderness.  Abdominal: Soft. There is no tenderness. There is no rebound and no guarding.  Musculoskeletal: Normal range of motion. He exhibits no edema or tenderness.  Neurological: He  is alert and oriented to person, place, and time. No cranial nerve deficit. He exhibits normal muscle tone. Coordination normal.  No ataxia on finger to nose bilaterally. No pronator drift. 5/5 strength throughout. CN 2-12 intact.Equal grip strength. Sensation intact.   Skin: Skin is warm.  Psychiatric: He has a normal mood and affect. His behavior is normal.  Nursing note and vitals reviewed.    ED Treatments / Results  Labs (all labs ordered are listed, but only abnormal results are displayed) Labs Reviewed  CBC WITH DIFFERENTIAL/PLATELET - Abnormal; Notable for the following components:      Result Value   Hemoglobin 12.5 (*)    MCH 24.9 (*)    RDW 16.8 (*)    All other components within normal limits  COMPREHENSIVE METABOLIC PANEL - Abnormal; Notable for the following components:   Sodium 132 (*)    Glucose, Bld 130 (*)    Calcium 8.7 (*)    All other components within normal limits  BRAIN NATRIURETIC PEPTIDE - Abnormal; Notable for  the following components:   B Natriuretic Peptide 141.6 (*)    All other components within normal limits  TROPONIN I  PROTIME-INR  D-DIMER, QUANTITATIVE (NOT AT [facility])  RAPID URINE DRUG SCREEN, HOSP PERFORMED    EKG EKG Interpretation  Date/Time:  Monday July 15 2018 01:50:31 EST Ventricular Rate:  57 PR Interval:    QRS Duration: 101 QT Interval:  472 QTC Calculation: 460 R Axis:   -39 Text Interpretation:  Sinus rhythm RSR' in V1 or V2, right VCD or RVH LVH with secondary repolarization abnormality Inferior infarct, age indeterminate t waves now inverted in v2 and v3 Confirmed by Glynn Octaveancour, Everton Bertha [id number]) on 07/15/2018 1:57:56 AM   Radiology Dg Chest 2 View  Result Date: 07/15/2018 CLINICAL DATA:  Chest pain. EXAM: CHEST - 2 VIEW COMPARISON:  None. FINDINGS: The heart size and mediastinal contours are within normal limits. Both lungs are clear. The visualized skeletal structures are unremarkable. IMPRESSION: No active cardiopulmonary disease. Electronically Signed   By: Gerome Samavid  Williams III M.D   On: 07/15/2018 02:32   Ct Head Wo Contrast  Result Date: 07/15/2018 CLINICAL DATA:  Patient fell backwards. Possibly hit head. EXAM: CT HEAD WITHOUT CONTRAST TECHNIQUE: Contiguous axial images were obtained from the base of the skull through the vertex without intravenous contrast. COMPARISON:  03/17/2010 FINDINGS: Brain: No evidence of acute infarction, hemorrhage, hydrocephalus, extra-axial collection or mass lesion/mass effect. Vascular: No hyperdense vessel or unexpected calcification. Skull: Normal. Negative for fracture or focal lesion. Sinuses/Orbits: Paranasal sinuses and mastoid air cells are clear. Other: None. IMPRESSION: No acute intracranial abnormalities. Electronically Signed   By: Burman NievesWilliam  Stevens M.D.   On: 07/15/2018 02:28   Mr Brain Wo Contrast  Result Date: 07/15/2018 CLINICAL DATA:  Syncope EXAM: MRI HEAD WITHOUT CONTRAST TECHNIQUE: Multiplanar, multiecho pulse  sequences of the brain and surrounding structures were obtained without intravenous contrast. COMPARISON:  CT head 07/15/2018 FINDINGS: Brain: Ventricle size and cerebral volume normal. Negative for acute infarct. Negative for hemorrhage mass or edema. Minimal chronic white matter changes. Basal ganglia brainstem and cerebellum normal. Vascular: Normal arterial flow voids Skull and upper cervical spine: Negative Sinuses/Orbits: Mild mucosal edema left maxillary sinus. Bilateral cataract surgery Other: None IMPRESSION: No acute abnormality.  Within normal limits for age. Electronically Signed   By: Marlan Palauharles  Clark M.D.   On: 07/15/2018 08:22   Ct Angio Chest/abd/pel For Dissection W And/or Wo Contrast  Result Date: 07/15/2018 CLINICAL DATA:  Chest pain and fall EXAM: CT ANGIOGRAPHY CHEST, ABDOMEN AND PELVIS TECHNIQUE: Multidetector CT imaging through the chest, abdomen and pelvis was performed using the standard protocol during bolus administration of intravenous contrast. Multiplanar reconstructed images and MIPs were obtained and reviewed to evaluate the vascular anatomy. CONTRAST:  ISOVUE-370 IOPAMIDOL (ISOVUE-370) INJECTION 76% COMPARISON:  CTA chest 05/15/2018 FINDINGS: CTA CHEST FINDINGS Cardiovascular: --Heart: The heart size is normal. There is nopericardial effusion. Status post median sternotomy and CABG. --Aorta: The course and caliber of the thoracic aorta are normal. There is mild aortic atherosclerotic calcification. Precontrast images show no aortic intramural hematoma. There is no blood pool, dissection or penetrating ulcer demonstrated on arterial phase postcontrast imaging. There is a conventional 3 vessel aortic arch branching pattern. There is atherosclerotic plaque at the origin of the brachiocephalic artery with less than 50% stenosis. The other arch vessel origins are widely patent. --Pulmonary Arteries: Contrast timing is optimized for preferential opacification of the aorta. Within  that limitation, normal central pulmonary arteries. Mediastinum/Nodes: No mediastinal, hilar or axillary lymphadenopathy. The visualized thyroid and thoracic esophageal course are unremarkable. Lungs/Pleura: There are multiple scattered, tiny pulmonary nodules, measuring up to 5 mm. The largest is in the right middle lobe, series 6, image 24. No pleural effusion or pneumothorax. No focal airspace consolidation. No focal pleural abnormality. Musculoskeletal: No chest wall abnormality. No acute osseous findings. Review of the MIP images confirms the above findings. CTA ABDOMEN AND PELVIS FINDINGS VASCULAR Aorta: Normal caliber aorta without aneurysm, dissection, vasculitis or hemodynamically significant stenosis. There is moderate mixed density aortic atherosclerosis. Celiac: No aneurysm, dissection or hemodynamically significant stenosis. Normal branching pattern. SMA: Widely patent without dissection or stenosis. Renals: Paired renal arteries bilaterally. No aneurysm, dissection, or evidence of fibromuscular dysplasia. There is moderate narrowing of the more superior left renal artery at its origin. The more distal aspect remains widely patent. IMA: Severe proximal stenosis but otherwise normal. Inflow: No aneurysm or dissection. There is mild mixed density atherosclerotic plaque causing mild-to-moderate stenosis of the proximal left internal iliac artery. Veins: Normal course and caliber of the major veins. Assessment is otherwise limited by the arterial dominant contrast phase. Review of the MIP images confirms the above findings. NON-VASCULAR Hepatobiliary: Normal hepatic contours and density. No visible biliary dilatation. Normal gallbladder. Pancreas: Normal contours without ductal dilatation. No peripancreatic fluid collection. Spleen: Normal arterial phase splenic enhancement pattern. Adrenals/Urinary Tract: --Adrenal glands: Normal. --Right kidney/ureter: Large right renal cyst measures 5.1 cm. --Left  kidney/ureter: No hydronephrosis or perinephric stranding. No nephrolithiasis. No obstructing ureteral stones. --Urinary bladder: Unremarkable. Stomach/Bowel: --Stomach/Duodenum: No hiatal hernia or other gastric abnormality. Normal duodenal course and caliber. --Small bowel: No dilatation or inflammation. --Colon: No focal abnormality. --Appendix: Normal. Lymphatic: No abdominal or pelvic lymphadenopathy. Reproductive: No free fluid in the pelvis. Musculoskeletal. No bony spinal canal stenosis or focal osseous abnormality. Other: None. Review of the MIP images confirms the above findings. IMPRESSION: 1. No acute aortic syndrome or other acute abnormality of the chest, abdomen or pelvis. 2. 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 3. Multiple scattered pulmonary nodules measuring up to 5 mm. No follow-up needed if patient is low-risk (and has no known or suspected primary neoplasm). Non-contrast chest CT can be considered in 12 months if patient is high-risk. This recommendation follows the consensus statement: Guidelines for Management of Incidental Pulmonary Nodules Detected on CT Images: From the Fleischner Society 2017; Radiology 2017; 284:228-243. Aortic Atherosclerosis (ICD10-I70.0). Electronically Signed  By: Deatra Robinson M.D.   On: 07/15/2018 03:59    Procedures Procedures (including critical care time)  Medications Ordered in ED Medications - No data to display   Initial Impression / Assessment and Plan / ED Course  I have reviewed the triage vital signs and the nursing notes.  Pertinent labs & imaging results that were available during my care of the patient were reviewed by me and considered in my medical decision making (see chart for details).    Patient with CAD status post CABG presenting with syncope without prodrome.  His EKG shows T wave inversions anteriorly,  laterally inferiorly.  He denies chest pain  currently.  New anterior T wave inversions.  Denies CP. Troponin and D-dimer negative.  CT head negative.   Decreased R radial pulse. Family aware of L subclavian stenosis but L radial pulse appears stronger. Both hands warm and well perfused.  CTA performed and no aortic dissection.  Concern for syncope without prodrome in patient with known CAD. Arrhythmia considered.  Observation admission d/w Dr. Clyde Lundborg. Final Clinical Impressions(s) / ED Diagnoses   Final diagnoses:  Syncope, unspecified syncope type    ED Discharge Orders    None       Derrian Rodak, Jeannett Senior, MD 07/15/18 [phone]

## 2018-07-15 NOTE — Progress Notes (Signed)
Patient admitted into 25c17. Alert and oriented on arrival. VSS stable. Oriented to room. Daughter at bedside.

## 2018-07-15 NOTE — Progress Notes (Signed)
Pt alert and oriented in NAD. Pt and family verbalized understanding of discharge instructions.  

## 2018-07-15 NOTE — H&P (Addendum)
History and Physical    Nicholas [name] WUJ:[id number] DOB: 1956-08-10 DOA: 07/15/2018  Referring MD/NP/PA:   PCP: Anselmo Pickler, MD   Patient coming from:  The patient is coming from home.  At baseline, pt is independent for most of ADL.        Chief Complaint: syncope  HPI: Nicholas [name] is a 62 y.o. male with medical history significant of hypertension, hyperlipidemia, diabetes mellitus, CAD, CABG, stent placement, BPH, CHF with EF of 40%, subclavian artery stenosis, who presents with syncope.  Per his daughter, patient had 2 episodes of syncope in the middle night.  First episode happened at about 1 AM, when patient was brushing teeth in the bathroom, suddenly passed out, slidded down on the ground without any significant injury.  Few minutes later, patient passed out again, episode lasted for few seconds.  Patient denies any prodromal symptoms.  No seizure activity noted. Patient denies chest pain, shortness breath, palpitation.  No unilateral weakness, numbness or tingling his extremities.  No facial droop or slurred speech.  No nausea, vomiting, diarrhea, abdominal pain.  Patient is constipated.  No symptoms of UTI.  ED Course: pt was found to have negative troponin, INR 0.99, WBC 6.4, d-dimer negative, electrolytes renal function okay, no fever, mild bradycardia, RR 22, oxygen satting 97% on room air, chest x-ray negative.  CT head is negative for acute intracranial abnormalities.  CT angiogram of chest/abdomen/pelvis is negative for aortic dissection, no central PE, but showed multiple small pulmonary nodules.  Patient is placed on telemetry bed for observation.  Review of Systems:   General: no fevers, chills, no body weight gain HEENT: no blurry vision, hearing changes or sore throat Respiratory: no dyspnea, coughing, wheezing CV: no chest pain, no palpitations GI: no nausea, vomiting, abdominal pain, diarrhea, constipation GU: no dysuria, burning on urination, increased  urinary frequency, hematuria  Ext: no leg edema Neuro: no unilateral weakness, numbness, or tingling, no vision change or hearing loss. Has syncope. Skin: no rash, no skin tear. MSK: No muscle spasm, no deformity, no limitation of range of movement in spin Heme: No easy bruising.  Travel history: No recent long distant travel.  Allergy:  Allergies  Allergen Reactions  . Eggs Or Egg-Derived Products Itching and Rash    Past Medical History:  Diagnosis Date  . Coronary artery disease   . Heart murmur   . High cholesterol   . Hypertension   . NSTEMI (non-ST elevated myocardial infarction) (HCC) 05/14/2018  . PONV (postoperative nausea and vomiting)   . Type II diabetes mellitus (HCC)     Past Surgical History:  Procedure Laterality Date  . CATARACT EXTRACTION W/ INTRAOCULAR LENS  IMPLANT, BILATERAL Bilateral 2012-2017  . CORONARY ARTERY BYPASS GRAFT N/A 03/04/2018   Procedure: CORONARY ARTERY BYPASS GRAFTING (CABG) x 3: -FREE LIMA to LAD, -SVG to DIAGONAL, -SVG to OM; ENDOSCOPIC HARVEST GREATER SAPHENOUS VEIN: -Right Thigh  ;  Surgeon: Kerin Perna, MD;  Location: [facility] OR;  Service: Open Heart Surgery;  Laterality: N/A;  . CORONARY STENT INTERVENTION N/A 05/20/2018   Procedure: CORONARY STENT INTERVENTION;  Surgeon: Lyn Records, MD;  Location: [facility] INVASIVE CV LAB;  Service: Cardiovascular;  Laterality: N/A;  . EYE SURGERY    . INTRAVASCULAR PRESSURE WIRE/FFR STUDY N/A 03/01/2018   Procedure: INTRAVASCULAR PRESSURE WIRE/FFR STUDY;  Surgeon: Swaziland, Peter M, MD;  Location: [facility] INVASIVE CV LAB;  Service: Cardiovascular;  Laterality: N/A;  . LEFT HEART CATH AND CORONARY ANGIOGRAPHY N/A 03/01/2018  Procedure: LEFT HEART CATH AND CORONARY ANGIOGRAPHY;  Surgeon: Swaziland, Peter M, MD;  Location: [facility] INVASIVE CV LAB;  Service: Cardiovascular;  Laterality: N/A;  . LEFT HEART CATH AND CORS/GRAFTS ANGIOGRAPHY N/A 05/16/2018   Procedure: LEFT HEART CATH AND CORS/GRAFTS ANGIOGRAPHY;  Surgeon: Lennette Bihari, MD;  Location: [facility] INVASIVE CV LAB;  Service: Cardiovascular;  Laterality: N/A;  . TEE WITHOUT CARDIOVERSION N/A 03/04/2018   Procedure: TRANSESOPHAGEAL ECHOCARDIOGRAM (TEE);  Surgeon: Donata Clay, Theron Arista, MD;  Location: [facility] OR;  Service: Open Heart Surgery;  Laterality: N/A;    Social History:  reports that he has never smoked. He has never used smokeless tobacco. He reports that he drank alcohol. He reports that he does not use drugs.  Family History:  Family History  Problem Relation Age of Onset  . Heart disease Mother      Prior to Admission medications   Medication Sig Start Date End Date Taking? Authorizing Provider  aspirin 81 MG tablet Take 1 tablet (81 mg total) by mouth daily. 05/21/18  Yes Barrett, Joline Salt, PA-C  atorvastatin (LIPITOR) 80 MG tablet Take 1 tablet (80 mg total) by mouth daily at 6 PM. 05/08/18  Yes Donata Clay, Theron Arista, MD  clopidogrel (PLAVIX) 75 MG tablet Take 1 tablet (75 mg total) by mouth daily with breakfast. 06/18/18  Yes Revankar, Aundra Dubin, MD  finasteride (PROSCAR) 5 MG tablet Take 5 mg by mouth daily.   Yes [provider]  metFORMIN (GLUCOPHAGE) 500 MG tablet Take 500 mg by mouth 2 (two) times daily with a meal.   Yes [provider]  metoprolol tartrate (LOPRESSOR) 25 MG tablet Take 0.5 tablets (12.5 mg total) by mouth 2 (two) times daily. 05/21/18  Yes Barrett, Joline Salt, PA-C  nitroGLYCERIN (NITROSTAT) 0.4 MG SL tablet Place 1 tablet (0.4 mg total) under the tongue every 5 (five) minutes x 3 doses as needed for chest pain. 05/21/18  Yes Barrett, Joline Salt, PA-C  ranolazine (RANEXA) 1000 MG SR tablet Take 1 tablet (1,000 mg total) by mouth 2 (two) times daily. 05/31/18  Yes RevankarAundra Dubin, MD    Physical Exam: Vitals:   07/15/18 0500 07/15/18 0515 07/15/18 0530 07/15/18 0545  BP: (!) 112/59 (!) 145/81 124/73 113/70  Pulse: 65 65 63 64  Resp: 18 (!) 23 (!) 21 (!) 22  SpO2: 99% 97% 97% 96%  Weight:      Height:       General: Not  in acute distress HEENT:       Eyes: PERRL, EOMI, no scleral icterus.       ENT: No discharge from the ears and nose, no pharynx injection, no tonsillar enlargement.        Neck: No JVD, no bruit, no mass felt. Heme: No neck lymph node enlargement. Cardiac: S1/S2, RRR, No murmurs, No gallops or rubs. Respiratory: No rales, wheezing, rhonchi or rubs. GI: Soft, nondistended, nontender, no rebound pain, no organomegaly, BS present. GU: No hematuria Ext: No pitting leg edema bilaterally. 2+DP/PT pulse bilaterally. Pt has very faint radial pulses bilaterally, but both hands are warm. Musculoskeletal: No joint deformities, No joint redness or warmth, no limitation of ROM in spin. Skin: No rashes.  Neuro: Alert, oriented X3, cranial nerves II-XII grossly intact, moves all extremities normally. Muscle strength 5/5 in all extremities, sensation to light touch intact. Brachial reflex 2+ bilaterally. Negative Babinski's sign. Normal finger to nose test. Psych: Patient is not psychotic, no suicidal or hemocidal ideation.  Labs on  Admission: I have personally reviewed following labs and imaging studies  CBC: Recent Labs  Lab 07/15/18 0157  WBC 6.4  NEUTROABS 2.8  HGB 12.5*  HCT 40.3  MCV 80.1  PLT 208   Basic Metabolic Panel: Recent Labs  Lab 07/15/18 0157  NA 132*  K 3.5  CL 103  CO2 24  GLUCOSE 130*  BUN 14  CREATININE 0.85  CALCIUM 8.7*   GFR: Estimated Creatinine Clearance: 67.8 mL/min (by C-G formula based on SCr of 0.85 mg/dL). Liver Function Tests: Recent Labs  Lab 07/15/18 0157  AST 19  ALT 21  ALKPHOS 65  BILITOT 0.5  PROT 6.6  ALBUMIN 3.7   No results for input(s): LIPASE, AMYLASE in the last 168 hours. No results for input(s): AMMONIA in the last 168 hours. Coagulation Profile: Recent Labs  Lab 07/15/18 0157  INR 0.99   Cardiac Enzymes: Recent Labs  Lab 07/15/18 0157  TROPONINI <0.03   BNP (last 3 results) No results for input(s): PROBNP in the last  8760 hours. HbA1C: No results for input(s): HGBA1C in the last 72 hours. CBG: No results for input(s): GLUCAP in the last 168 hours. Lipid Profile: No results for input(s): CHOL, HDL, LDLCALC, TRIG, CHOLHDL, LDLDIRECT in the last 72 hours. Thyroid Function Tests: No results for input(s): TSH, T4TOTAL, FREET4, T3FREE, THYROIDAB in the last 72 hours. Anemia Panel: No results for input(s): VITAMINB12, FOLATE, FERRITIN, TIBC, IRON, RETICCTPCT in the last 72 hours. Urine analysis:    Component Value Date/Time   COLORURINE COLORLESS (A) 03/01/2018 2209   APPEARANCEUR CLEAR 03/01/2018 2209   LABSPEC 1.004 (L) 03/01/2018 2209   PHURINE 6.0 03/01/2018 2209   GLUCOSEU NEGATIVE 03/01/2018 2209   HGBUR NEGATIVE 03/01/2018 2209   BILIRUBINUR NEGATIVE 03/01/2018 2209   KETONESUR NEGATIVE 03/01/2018 2209   PROTEINUR NEGATIVE 03/01/2018 2209   NITRITE NEGATIVE 03/01/2018 2209   LEUKOCYTESUR NEGATIVE 03/01/2018 2209   Sepsis Labs: @LABRCNTIP (procalcitonin:4,lacticidven:4) )No results found for this or any previous visit (from the past 240 hour(s)).   Radiological Exams on Admission: Dg Chest 2 View  Result Date: 07/15/2018 CLINICAL DATA:  Chest pain. EXAM: CHEST - 2 VIEW COMPARISON:  None. FINDINGS: The heart size and mediastinal contours are within normal limits. Both lungs are clear. The visualized skeletal structures are unremarkable. IMPRESSION: No active cardiopulmonary disease. Electronically Signed   By: Gerome Sam III M.D   On: 07/15/2018 02:32   Ct Head Wo Contrast  Result Date: 07/15/2018 CLINICAL DATA:  Patient fell backwards. Possibly hit head. EXAM: CT HEAD WITHOUT CONTRAST TECHNIQUE: Contiguous axial images were obtained from the base of the skull through the vertex without intravenous contrast. COMPARISON:  03/17/2010 FINDINGS: Brain: No evidence of acute infarction, hemorrhage, hydrocephalus, extra-axial collection or mass lesion/mass effect. Vascular: No hyperdense vessel  or unexpected calcification. Skull: Normal. Negative for fracture or focal lesion. Sinuses/Orbits: Paranasal sinuses and mastoid air cells are clear. Other: None. IMPRESSION: No acute intracranial abnormalities. Electronically Signed   By: Burman Nieves M.D.   On: 07/15/2018 02:28   Ct Angio Chest/abd/pel For Dissection W And/or Wo Contrast  Result Date: 07/15/2018 CLINICAL DATA:  Chest pain and fall EXAM: CT ANGIOGRAPHY CHEST, ABDOMEN AND PELVIS TECHNIQUE: Multidetector CT imaging through the chest, abdomen and pelvis was performed using the standard protocol during bolus administration of intravenous contrast. Multiplanar reconstructed images and MIPs were obtained and reviewed to evaluate the vascular anatomy. CONTRAST:  ISOVUE-370 IOPAMIDOL (ISOVUE-370) INJECTION 76% COMPARISON:  CTA chest 05/15/2018 FINDINGS:  CTA CHEST FINDINGS Cardiovascular: --Heart: The heart size is normal. There is nopericardial effusion. Status post median sternotomy and CABG. --Aorta: The course and caliber of the thoracic aorta are normal. There is mild aortic atherosclerotic calcification. Precontrast images show no aortic intramural hematoma. There is no blood pool, dissection or penetrating ulcer demonstrated on arterial phase postcontrast imaging. There is a conventional 3 vessel aortic arch branching pattern. There is atherosclerotic plaque at the origin of the brachiocephalic artery with less than 50% stenosis. The other arch vessel origins are widely patent. --Pulmonary Arteries: Contrast timing is optimized for preferential opacification of the aorta. Within that limitation, normal central pulmonary arteries. Mediastinum/Nodes: No mediastinal, hilar or axillary lymphadenopathy. The visualized thyroid and thoracic esophageal course are unremarkable. Lungs/Pleura: There are multiple scattered, tiny pulmonary nodules, measuring up to 5 mm. The largest is in the right middle lobe, series 6, image 24. No pleural  effusion or pneumothorax. No focal airspace consolidation. No focal pleural abnormality. Musculoskeletal: No chest wall abnormality. No acute osseous findings. Review of the MIP images confirms the above findings. CTA ABDOMEN AND PELVIS FINDINGS VASCULAR Aorta: Normal caliber aorta without aneurysm, dissection, vasculitis or hemodynamically significant stenosis. There is moderate mixed density aortic atherosclerosis. Celiac: No aneurysm, dissection or hemodynamically significant stenosis. Normal branching pattern. SMA: Widely patent without dissection or stenosis. Renals: Paired renal arteries bilaterally. No aneurysm, dissection, or evidence of fibromuscular dysplasia. There is moderate narrowing of the more superior left renal artery at its origin. The more distal aspect remains widely patent. IMA: Severe proximal stenosis but otherwise normal. Inflow: No aneurysm or dissection. There is mild mixed density atherosclerotic plaque causing mild-to-moderate stenosis of the proximal left internal iliac artery. Veins: Normal course and caliber of the major veins. Assessment is otherwise limited by the arterial dominant contrast phase. Review of the MIP images confirms the above findings. NON-VASCULAR Hepatobiliary: Normal hepatic contours and density. No visible biliary dilatation. Normal gallbladder. Pancreas: Normal contours without ductal dilatation. No peripancreatic fluid collection. Spleen: Normal arterial phase splenic enhancement pattern. Adrenals/Urinary Tract: --Adrenal glands: Normal. --Right kidney/ureter: Large right renal cyst measures 5.1 cm. --Left kidney/ureter: No hydronephrosis or perinephric stranding. No nephrolithiasis. No obstructing ureteral stones. --Urinary bladder: Unremarkable. Stomach/Bowel: --Stomach/Duodenum: No hiatal hernia or other gastric abnormality. Normal duodenal course and caliber. --Small bowel: No dilatation or inflammation. --Colon: No focal abnormality. --Appendix: Normal.  Lymphatic: No abdominal or pelvic lymphadenopathy. Reproductive: No free fluid in the pelvis. Musculoskeletal. No bony spinal canal stenosis or focal osseous abnormality. Other: None. Review of the MIP images confirms the above findings. IMPRESSION: 1. No acute aortic syndrome or other acute abnormality of the chest, abdomen or pelvis. 2. 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 3. Multiple scattered pulmonary nodules measuring up to 5 mm. No follow-up needed if patient is low-risk (and has no known or suspected primary neoplasm). Non-contrast chest CT can be considered in 12 months if patient is high-risk. This recommendation follows the consensus statement: Guidelines for Management of Incidental Pulmonary Nodules Detected on CT Images: From the Fleischner Society 2017; Radiology 2017; 284:228-243. Aortic Atherosclerosis (ICD10-I70.0). Electronically Signed   By: Deatra RobinsonKevin  Herman M.D.   On: 07/15/2018 03:59     EKG: Independently reviewed.  Sinus rhythm, QTC 460, LAD, early R wave progression, T wave inversion in inferior leads and V3-V6 (T wave inversion is similar to previous EKG, not new.).  Assessment/Plan Principal Problem:   Syncope Active Problems:   Subclavian arterial stenosis (  HCC)   S/P CABG x 3   Non-insulin dependent type 2 diabetes mellitus (HCC)   Dyslipidemia, goal LDL below 70   CAD (coronary artery disease)   HTN (hypertension)   Pulmonary nodules   Chronic systolic (congestive) heart failure (HCC)   Syncope: Etiology is not clear. The differential diagnosis is broad, including vasovagal syncope, carotid artery stenosis given known history of subclavian artery stenosis, TIA, arrhythmia, ACS (less likely, given no chest pain) and orthostatic status. Pt just had 2D echo on 07/03/2018 which showed EF of 40-45%.  Will not repeat a 2D echo.  - Place on tele bed for obs - Orthostatic vital signs  - Carotid  doppler, MRI-brain - Neuro checks  - IVF: 500 cc of NS, then  75 cc/h - PT/OT eval and treat  Subclavian arterial stenosis [facility]): Patient has faint radial pulse bilaterally, but hands are warm. -Continue aspirin, Lipitor  CAD: s/p of CABG and stent. No CP -continue aspirin, Renexa, Lipitor, Plavix, metoprolol, -PRN nitroglycerin  Non-insulin dependent type 2 diabetes mellitus (HCC): Last A1c 7.6, not well controled. Patient is taking metformin at home -SSI  Dyslipidemia, goal LDL below 70: -lipitor  HTN:  -Continue home medications: Metoprolol -IV hydralazine prn  BPH: stable - Continue Proscar  Chronic systolic congestive heart failure: 2D echo on 07/03/2018 showed EF of 40-45%.  Patient does not have leg edema.  No JVD.  No shortness of breath.  No pulmonary edema on chest x-ray.  CHF is compensated.  Patient is not taking diuretics at home. To check BNP. -Continue aspirin and metoprolol  Pulmonary nodules:  -f/u with PCP   DVT ppx: SQ Lovenox Code Status: Full code Family Communication: Yes, patient's daughter   at bed side Disposition Plan:  Anticipate discharge back to previous home environment Consults called:  none Admission status: Obs / tele     Date of Service 07/15/2018    Lorretta Harp [facility] Pager [phone]  If 7PM-7AM, please contact night-coverage [email or web address] Password [email or web address] 07/15/2018, 6:22 AM

## 2018-07-15 NOTE — Discharge Summary (Signed)
Physician Discharge Summary  Nicholas [name] ZOX:[id number] DOB: [birth year] DOA: 07/15/2018  PCP: Anselmo PicklerAchreja, Manjeet Kaur, MD  Admit date: 07/15/2018 Discharge date: 07/15/2018  Time spent: 40 minutes  Recommendations for Outpatient Follow-up:  1. Follow up with PCP 1-2 weeks. Recommend repeat chest xray for monitoring and monitoring of serum glucose 2. Follow up with cardiology as scheduled   Discharge Diagnoses:  Principal Problem:   Syncope Active Problems:   Subclavian arterial stenosis (HCC)   S/P CABG x 3   Non-insulin dependent type 2 diabetes mellitus (HCC)   Dyslipidemia, goal LDL below 70   CAD (coronary artery disease)   HTN (hypertension)   Pulmonary nodules   Chronic systolic (congestive) heart failure (HCC)   Discharge Condition: stable  Diet recommendation: heart healthy  Filed Weights   07/15/18 0153  Weight: 61.7 kg    History of present illness:  Nicholas [name] is a 62 y.o. male with medical history significant of hypertension, hyperlipidemia, diabetes mellitus, CAD, CABG, stent placement, BPH, CHF with EF of 40%, subclavian artery stenosis, who presented 11/18 with syncope.  Per his daughter, patient had 2 episodes of syncope in the middle night.  First episode happened at about 1 AM, when patient was brushing teeth in the bathroom, suddenly passed out, slidded down on the ground without any significant injury.  Few minutes later, patient passed out again, episode lasted for few seconds.  Patient denied any prodromal symptoms.  No seizure activity noted. Patient denied chest pain, shortness breath, palpitation.  No unilateral weakness, numbness or tingling his extremities.  No facial droop or slurred speech.  No nausea, vomiting, diarrhea, abdominal pain.  Patient complained of constipated.  No symptoms of UTI.   Hospital Course:  Syncope: Etiology is not clear. The differential diagnosis is broad, including vasovagal syncope, carotid artery stenosis given  known history of subclavian artery stenosis, TIA, arrhythmia, ACS (less likely, given no chest pain) and orthostatic status. Pt just had 2D echo on 07/03/2018 which showed EF of 40-45%.  Will not repeat a 2D echo. No events on tele. Neuro exam benign. Carotid doppler with 60-79% right ICA stenosis and 40-59% left ICA stenosis. Positive orthostatics at presentation. Received IV fluids. At discharge SBP 142. No s/sx infectious process, no metabolic derangements. His cardiologist notified via staff messaging that patient admitted and work up negative. Has apt scheduled jan 2020.   Subclavian arterial stenosis [facility](HCC): Patient maintained faint radial pulse bilaterally, and  hands remained warm.  CAD: s/p of CABG and stent. No CP -continue aspirin, Renexa, Lipitor, Plavix, metoprolol,   Non-insulin dependent type 2 diabetes mellitus (HCC): Last A1c 7.6, not well controled. Patient is taking metformin at home   Dyslipidemia, goal LDL below 70: -lipitor  HTN:  -Continue home medications: Metoprolol -IV hydralazine prn  BPH: stable - Continue Proscar  Chronic systolic congestive heart failure: 2D echo on 07/03/2018 showed EF of 40-45%.  Patient does not have leg edema.  No JVD.  No shortness of breath.  No pulmonary edema on chest x-ray.  CHF is compensated.  Patient is not taking diuretics at home. To check BNP. -Continue aspirin and metoprolol  Pulmonary nodules:  -f/u with PCP Procedures:  Carotid doppler  Consultations:  none  Discharge Exam: Vitals:   07/15/18 0624 07/15/18 1022  BP: 109/65 (!) 142/68  Pulse: 66 74  Resp: 20 18  Temp: 97.9 F (36.6 C) 98 F (36.7 C)  SpO2: 95% 97%    General: well nourished lying in bed in  no acute distress Cardiovascular: rrr no mgr no LE edema Respiratory: normal effort BS clear bilaterally  Discharge Instructions   Discharge Instructions    Diet - low sodium heart healthy   Complete by:  As directed    Discharge  instructions   Complete by:  As directed    Follow up with cardiology as scheduled. Take medication as prescribed   Increase activity slowly   Complete by:  As directed      Allergies as of 07/15/2018      Reactions   Eggs Or Egg-derived Products Itching, Rash      Medication List    TAKE these medications   aspirin EC 81 MG tablet Take 1 tablet (81 mg total) by mouth daily.   atorvastatin 80 MG tablet Commonly known as:  LIPITOR Take 1 tablet (80 mg total) by mouth daily at 6 PM.   clopidogrel 75 MG tablet Commonly known as:  PLAVIX Take 1 tablet (75 mg total) by mouth daily with breakfast.   finasteride 5 MG tablet Commonly known as:  PROSCAR Take 5 mg by mouth daily.   metFORMIN 500 MG tablet Commonly known as:  GLUCOPHAGE Take 500 mg by mouth 2 (two) times daily with a meal.   metoprolol tartrate 25 MG tablet Commonly known as:  LOPRESSOR Take 0.5 tablets (12.5 mg total) by mouth 2 (two) times daily.   nitroGLYCERIN 0.4 MG SL tablet Commonly known as:  NITROSTAT Place 1 tablet (0.4 mg total) under the tongue every 5 (five) minutes x 3 doses as needed for chest pain.   ranolazine 1000 MG SR tablet Commonly known as:  RANEXA Take 1 tablet (1,000 mg total) by mouth 2 (two) times daily.      Allergies  Allergen Reactions  . Eggs Or Egg-Derived Products Itching and Rash      The results of significant diagnostics from this hospitalization (including imaging, microbiology, ancillary and laboratory) are listed below for reference.    Significant Diagnostic Studies: Dg Chest 2 View  Result Date: 07/15/2018 CLINICAL DATA:  Chest pain. EXAM: CHEST - 2 VIEW COMPARISON:  None. FINDINGS: The heart size and mediastinal contours are within normal limits. Both lungs are clear. The visualized skeletal structures are unremarkable. IMPRESSION: No active cardiopulmonary disease. Electronically Signed   By: Gerome Sam III M.D   On: 07/15/2018 02:32   Ct Head Wo  Contrast  Result Date: 07/15/2018 CLINICAL DATA:  Patient fell backwards. Possibly hit head. EXAM: CT HEAD WITHOUT CONTRAST TECHNIQUE: Contiguous axial images were obtained from the base of the skull through the vertex without intravenous contrast. COMPARISON:  03/17/2010 FINDINGS: Brain: No evidence of acute infarction, hemorrhage, hydrocephalus, extra-axial collection or mass lesion/mass effect. Vascular: No hyperdense vessel or unexpected calcification. Skull: Normal. Negative for fracture or focal lesion. Sinuses/Orbits: Paranasal sinuses and mastoid air cells are clear. Other: None. IMPRESSION: No acute intracranial abnormalities. Electronically Signed   By: Burman Nieves M.D.   On: 07/15/2018 02:28   Mr Brain Wo Contrast  Result Date: 07/15/2018 CLINICAL DATA:  Syncope EXAM: MRI HEAD WITHOUT CONTRAST TECHNIQUE: Multiplanar, multiecho pulse sequences of the brain and surrounding structures were obtained without intravenous contrast. COMPARISON:  CT head 07/15/2018 FINDINGS: Brain: Ventricle size and cerebral volume normal. Negative for acute infarct. Negative for hemorrhage mass or edema. Minimal chronic white matter changes. Basal ganglia brainstem and cerebellum normal. Vascular: Normal arterial flow voids Skull and upper cervical spine: Negative Sinuses/Orbits: Mild mucosal edema left maxillary sinus. Bilateral cataract  surgery Other: None IMPRESSION: No acute abnormality.  Within normal limits for age. Electronically Signed   By: Marlan Palau M.D.   On: 07/15/2018 08:22   Ct Angio Chest/abd/pel For Dissection W And/or Wo Contrast  Result Date: 07/15/2018 CLINICAL DATA:  Chest pain and fall EXAM: CT ANGIOGRAPHY CHEST, ABDOMEN AND PELVIS TECHNIQUE: Multidetector CT imaging through the chest, abdomen and pelvis was performed using the standard protocol during bolus administration of intravenous contrast. Multiplanar reconstructed images and MIPs were obtained and reviewed to evaluate the  vascular anatomy. CONTRAST:  ISOVUE-370 IOPAMIDOL (ISOVUE-370) INJECTION 76% COMPARISON:  CTA chest 05/15/2018 FINDINGS: CTA CHEST FINDINGS Cardiovascular: --Heart: The heart size is normal. There is nopericardial effusion. Status post median sternotomy and CABG. --Aorta: The course and caliber of the thoracic aorta are normal. There is mild aortic atherosclerotic calcification. Precontrast images show no aortic intramural hematoma. There is no blood pool, dissection or penetrating ulcer demonstrated on arterial phase postcontrast imaging. There is a conventional 3 vessel aortic arch branching pattern. There is atherosclerotic plaque at the origin of the brachiocephalic artery with less than 50% stenosis. The other arch vessel origins are widely patent. --Pulmonary Arteries: Contrast timing is optimized for preferential opacification of the aorta. Within that limitation, normal central pulmonary arteries. Mediastinum/Nodes: No mediastinal, hilar or axillary lymphadenopathy. The visualized thyroid and thoracic esophageal course are unremarkable. Lungs/Pleura: There are multiple scattered, tiny pulmonary nodules, measuring up to 5 mm. The largest is in the right middle lobe, series 6, image 24. No pleural effusion or pneumothorax. No focal airspace consolidation. No focal pleural abnormality. Musculoskeletal: No chest wall abnormality. No acute osseous findings. Review of the MIP images confirms the above findings. CTA ABDOMEN AND PELVIS FINDINGS VASCULAR Aorta: Normal caliber aorta without aneurysm, dissection, vasculitis or hemodynamically significant stenosis. There is moderate mixed density aortic atherosclerosis. Celiac: No aneurysm, dissection or hemodynamically significant stenosis. Normal branching pattern. SMA: Widely patent without dissection or stenosis. Renals: Paired renal arteries bilaterally. No aneurysm, dissection, or evidence of fibromuscular dysplasia. There is moderate narrowing of the more  superior left renal artery at its origin. The more distal aspect remains widely patent. IMA: Severe proximal stenosis but otherwise normal. Inflow: No aneurysm or dissection. There is mild mixed density atherosclerotic plaque causing mild-to-moderate stenosis of the proximal left internal iliac artery. Veins: Normal course and caliber of the major veins. Assessment is otherwise limited by the arterial dominant contrast phase. Review of the MIP images confirms the above findings. NON-VASCULAR Hepatobiliary: Normal hepatic contours and density. No visible biliary dilatation. Normal gallbladder. Pancreas: Normal contours without ductal dilatation. No peripancreatic fluid collection. Spleen: Normal arterial phase splenic enhancement pattern. Adrenals/Urinary Tract: --Adrenal glands: Normal. --Right kidney/ureter: Large right renal cyst measures 5.1 cm. --Left kidney/ureter: No hydronephrosis or perinephric stranding. No nephrolithiasis. No obstructing ureteral stones. --Urinary bladder: Unremarkable. Stomach/Bowel: --Stomach/Duodenum: No hiatal hernia or other gastric abnormality. Normal duodenal course and caliber. --Small bowel: No dilatation or inflammation. --Colon: No focal abnormality. --Appendix: Normal. Lymphatic: No abdominal or pelvic lymphadenopathy. Reproductive: No free fluid in the pelvis. Musculoskeletal. No bony spinal canal stenosis or focal osseous abnormality. Other: None. Review of the MIP images confirms the above findings. IMPRESSION: 1. No acute aortic syndrome or other acute abnormality of the chest, abdomen or pelvis. 2. 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 3. Multiple scattered pulmonary nodules measuring up to 5 mm. No follow-up needed if patient is low-risk (and has no known or suspected  primary neoplasm). Non-contrast chest CT can be considered in 12 months if patient is high-risk. This recommendation follows the  consensus statement: Guidelines for Management of Incidental Pulmonary Nodules Detected on CT Images: From the Fleischner Society 2017; Radiology 2017; 284:228-243. Aortic Atherosclerosis (ICD10-I70.0). Electronically Signed   By: Deatra Robinson M.D.   On: 07/15/2018 03:59    Microbiology: No results found for this or any previous visit (from the past 240 hour(s)).   Labs: Basic Metabolic Panel: Recent Labs  Lab 07/15/18 0157  NA 132*  K 3.5  CL 103  CO2 24  GLUCOSE 130*  BUN 14  CREATININE 0.85  CALCIUM 8.7*   Liver Function Tests: Recent Labs  Lab 07/15/18 0157  AST 19  ALT 21  ALKPHOS 65  BILITOT 0.5  PROT 6.6  ALBUMIN 3.7   No results for input(s): LIPASE, AMYLASE in the last 168 hours. No results for input(s): AMMONIA in the last 168 hours. CBC: Recent Labs  Lab 07/15/18 0157  WBC 6.4  NEUTROABS 2.8  HGB 12.5*  HCT 40.3  MCV 80.1  PLT 208   Cardiac Enzymes: Recent Labs  Lab 07/15/18 0157  TROPONINI <0.03   BNP: BNP (last 3 results) Recent Labs    07/15/18 0157  BNP 141.6*    ProBNP (last 3 results) No results for input(s): PROBNP in the last 8760 hours.  CBG: Recent Labs  Lab 07/15/18 1011  GLUCAP 180*       Signed:  Gwenyth Bender NP [facility] 07/15/2018, 11:04 AM

## 2018-07-15 NOTE — Care Management (Signed)
CM met with patient/daughter and provided "How to Apply for [place] Disability" information as requested.   Midge Minium RN, BSN, NCM-BC, ACM-RN [phone]

## 2018-07-15 NOTE — Evaluation (Signed)
Physical Therapy Evaluation and Discharge Patient Details Name: Nicholas BignessGurdev [name] MRN: [id number] DOB: Nov 06, 1955 Today's Date: 07/15/2018   History of Present Illness  Pt is a 62 y/o male admitted secondary to syncopal episode. Per notes of unclear etiology, however, pt does have history of subclavian stensosis and did have positive orthostatics. PMH includes CAD s/p CABG and stent placement, HTN, CHF, and DM.   Clinical Impression  Patient evaluated by Physical Therapy with no further acute PT needs identified. All education has been completed and the patient has no further questions. Pt overall steady throughout gait and stair navigation. Performed mobility tasks at a supervision to independence level. Pt reports he is at his baseline with mobility. Of note, pt mentioned he had been having numb/achy pain in L chest region. Reports it is not a new pain and has been happening on and off since last surgery. Notified RN; VSS. See below for any follow-up Physical Therapy or equipment needs. PT is signing off. Thank you for this referral. If needs change, please reconsult.     Follow Up Recommendations No PT follow up    Equipment Recommendations  None recommended by PT    Recommendations for Other Services       Precautions / Restrictions Precautions Precautions: None Restrictions Weight Bearing Restrictions: No      Mobility  Bed Mobility Overal bed mobility: Independent                Transfers Overall transfer level: Independent                  Ambulation/Gait Ambulation/Gait assistance: Supervision Gait Distance (Feet): 150 Feet Assistive device: None Gait Pattern/deviations: WFL(Within Functional Limits) Gait velocity: WFL    General Gait Details: Overall steady gait. Able to perform dynamic head turns without LOB. Pt reports he is at baseline with ambulation.   Stairs Stairs: Yes Stairs assistance: Supervision Stair Management: No rails;Alternating  pattern;Step to pattern;Forwards Number of Stairs: 2 General stair comments: Alternating pattern when ascending steps and step to pattern when descending. No LOB noted during stair navigation and required supervision for safety.   Wheelchair Mobility    Modified Rankin (Stroke Patients Only)       Balance Overall balance assessment: No apparent balance deficits (not formally assessed)                                           Pertinent Vitals/Pain Pain Assessment: 0-10 Pain Score: 3  Pain Location: chest  Pain Descriptors / Indicators: Numbness;Aching Pain Intervention(s): Limited activity within patient's tolerance;Monitored during session;Repositioned;Other (comment)(RN notified )    Home Living Family/patient expects to be discharged to:: Private residence Living Arrangements: Children Available Help at Discharge: Family Type of Home: House Home Access: Stairs to enter Entrance Stairs-Rails: None Secretary/administratorntrance Stairs-Number of Steps: 2 Home Layout: One level Home Equipment: None      Prior Function Level of Independence: Independent               Hand Dominance        Extremity/Trunk Assessment   Upper Extremity Assessment Upper Extremity Assessment: Defer to OT evaluation    Lower Extremity Assessment Lower Extremity Assessment: Overall WFL for tasks assessed    Cervical / Trunk Assessment Cervical / Trunk Assessment: Normal  Communication   Communication: No difficulties(pt's daughter clarified if needed )  Cognition Arousal/Alertness: Awake/alert Behavior  During Therapy: WFL for tasks assessed/performed Overall Cognitive Status: Within Functional Limits for tasks assessed                                        General Comments General comments (skin integrity, edema, etc.): Pt's daughter present during session. Pt reports chest pain as a 3/10 and described as a numb/achy pain. Reports it has occured on and off  following surgery, and was not new pain. Notified RN. VSS.     Exercises     Assessment/Plan    PT Assessment Patent does not need any further PT services  PT Problem List         PT Treatment Interventions      PT Goals (Current goals can be found in the Care Plan section)  Acute Rehab PT Goals Patient Stated Goal: to go home PT Goal Formulation: With patient Time For Goal Achievement: 07/15/18 Potential to Achieve Goals: Good    Frequency     Barriers to discharge        Co-evaluation               AM-PAC PT "6 Clicks" Daily Activity  Outcome Measure Difficulty turning over in bed (including adjusting bedclothes, sheets and blankets)?: None Difficulty moving from lying on back to sitting on the side of the bed? : None Difficulty sitting down on and standing up from a chair with arms (e.g., wheelchair, bedside commode, etc,.)?: None Help needed moving to and from a bed to chair (including a wheelchair)?: None Help needed walking in hospital room?: None Help needed climbing 3-5 steps with a railing? : None 6 Click Score: 24    End of Session Equipment Utilized During Treatment: Gait belt Activity Tolerance: Patient tolerated treatment well Patient left: in bed;with call bell/phone within reach;with family/visitor present Nurse Communication: Mobility status;Other (comment)(pt reports chest pain ) PT Visit Diagnosis: Other abnormalities of gait and mobility (R26.89)    Time: 1610-9604 PT Time Calculation (min) (ACUTE ONLY): 19 min   Charges:   PT Evaluation $PT Eval Low Complexity: 1 Low          Gladys Damme, PT, DPT  Acute Rehabilitation Services  Pager: [phone] Office: [phone]   Lehman Prom 07/15/2018, 11:45 AM

## 2018-07-31 ENCOUNTER — Telehealth: Payer: Self-pay | Admitting: Cardiology

## 2018-07-31 NOTE — Telephone Encounter (Signed)
Per the daughter the patient went to the restroom last night and when he got up he felt slightly dizzy and had to sit down so he wouldn't fall. Patient noted his pulse to be 45. Patient did not take BP during this episode, did take CBG and it was noted to be 124. Patient took nitro during this episode, educated daughter that this med should only be taken for chest discomfort. Patient is on a beta blocker but no other medications that slow the pulse rate. Pulse and BP are within normal limits today and patient feels well. Please advise??

## 2018-07-31 NOTE — Telephone Encounter (Signed)
Pl call him

## 2018-07-31 NOTE — Telephone Encounter (Signed)
I would have Dr r see him this week esp with recent admission with syncope

## 2018-07-31 NOTE — Telephone Encounter (Signed)
Please contact as discussed.

## 2018-07-31 NOTE — Telephone Encounter (Signed)
Patient needs a phone call from doctor, he is dizzy, sweaty, gave him nitro, HR was 45 last night, please call and advise.

## 2018-07-31 NOTE — Telephone Encounter (Signed)
Left voicemail for the patient to call the office regarding his concerns.

## 2018-07-31 NOTE — Telephone Encounter (Signed)
Patient will call tomorrow to see if he can come in for an appointment on Friday.

## 2018-08-02 ENCOUNTER — Ambulatory Visit (INDEPENDENT_AMBULATORY_CARE_PROVIDER_SITE_OTHER): Payer: BLUE CROSS/BLUE SHIELD | Admitting: Cardiology

## 2018-08-02 ENCOUNTER — Encounter: Payer: Self-pay | Admitting: Cardiology

## 2018-08-02 VITALS — BP 124/60 | HR 61 | Ht 62.0 in | Wt 134.0 lb

## 2018-08-02 DIAGNOSIS — I214 Non-ST elevation (NSTEMI) myocardial infarction: Secondary | ICD-10-CM

## 2018-08-02 DIAGNOSIS — I2510 Atherosclerotic heart disease of native coronary artery without angina pectoris: Secondary | ICD-10-CM

## 2018-08-02 DIAGNOSIS — E785 Hyperlipidemia, unspecified: Secondary | ICD-10-CM | POA: Diagnosis not present

## 2018-08-02 DIAGNOSIS — E088 Diabetes mellitus due to underlying condition with unspecified complications: Secondary | ICD-10-CM | POA: Diagnosis not present

## 2018-08-02 DIAGNOSIS — Z9861 Coronary angioplasty status: Secondary | ICD-10-CM

## 2018-08-02 DIAGNOSIS — Z951 Presence of aortocoronary bypass graft: Secondary | ICD-10-CM

## 2018-08-02 NOTE — Patient Instructions (Signed)
Medication Instructions:  Your physician recommends that you continue on your current medications as directed. Please refer to the Current Medication list given to you today.  If you need a refill on your cardiac medications before your next appointment, please call your pharmacy.   Lab work: None.  If you have labs (blood work) drawn today and your tests are completely normal, you will receive your results only by: Marland Kitchen. MyChart Message (if you have MyChart) OR . A paper copy in the mail If you have any lab test that is abnormal or we need to change your treatment, we will call you to review the results.  Testing/Procedures: None.   Follow-Up: At [facility], you and your health needs are our priority.  As part of our continuing mission to provide you with exceptional heart care, we have created designated Provider Care Teams.  These Care Teams include your primary Cardiologist (physician) and Advanced Practice Providers (APPs -  Physician Assistants and Nurse Practitioners) who all work together to provide you with the care you need, when you need it. You will need a follow up appointment in 6 months.  Please call our office 2 months in advance to schedule this appointment.  You may see Nanetta BattyJonathan Berry, MD or another member of our [facility] Provider Team in [place]: Gypsy Balsamobert Krasowski, MD . Norman HerrlichBrian Munley, MD  Any Other Special Instructions Will Be Listed Below (If Applicable).

## 2018-08-02 NOTE — Progress Notes (Signed)
Cardiology Office Note:    Date:  08/02/2018   ID:  [name], DOB 12-25-1955, MRN [id number]  PCP:  Anselmo Pickler, MD  Cardiologist:  Garwin Brothers, MD   Referring MD: Anselmo Pickler, *    ASSESSMENT:    1. CAD S/P percutaneous coronary angioplasty   2. NSTEMI (non-ST elevated myocardial infarction) (HCC)   3. Dyslipidemia, goal LDL below 70   4. Diabetes mellitus due to underlying condition with unspecified complications (HCC)   5. S/P CABG x 3   6. Coronary artery disease involving native coronary artery of native heart without angina pectoris    PLAN:    In order of problems listed above:  1. Secondary prevention stressed with the patient.  Importance of compliance with diet and medication stressed and he vocalized understanding.  His blood pressure is stable. 2. Overall he has issues with low blood pressure and he has been keeping his diet salt free.  I told him to take adequate amount of salt in his diet or rather normal amounts of salt and he vocalized understanding.  Encouraged hydration overall.  Especially when he exercises.  He vocalized understanding and he will get back to [place] and let us know how he feels in the next few days. 3. Patient will be seen in follow-up appointment in 6 months or earlier if the patient has any concerns    Medication Adjustments/Labs and Tests Ordered: Current medicines are reviewed at length with the patient today.  Concerns regarding medicines are outlined above.  No orders of the defined types were placed in this encounter.  No orders of the defined types were placed in this encounter.    No chief complaint on file.    History of Present Illness:    Nicholas [name] is a 62 y.o. male.  Patient has past medical history of coronary artery disease.  He has symptoms suggesting of postural hypotension and generally low blood pressure.  Unfortunately he has been taking sublingual nitroglycerin inappropriately and passing  out.  Was treated and released.  Carotid evaluation was done and it shows evidence of atherosclerotic disease.  There is no history suggestive of TIA or CVA.  His daughter accompanies him for the visit.  They mentioned to me that he is been keeping his diet pretty much salt free.  At the time of my evaluation, the patient is alert awake oriented and in no distress.  Past Medical History:  Diagnosis Date  . Coronary artery disease   . Heart murmur   . High cholesterol   . Hypertension   . NSTEMI (non-ST elevated myocardial infarction) (HCC) 05/14/2018  . PONV (postoperative nausea and vomiting)   . Syncope   . Type II diabetes mellitus (HCC)     Past Surgical History:  Procedure Laterality Date  . CATARACT EXTRACTION W/ INTRAOCULAR LENS  IMPLANT, BILATERAL Bilateral 2012-2017  . CORONARY ARTERY BYPASS GRAFT N/A 03/04/2018   Procedure: CORONARY ARTERY BYPASS GRAFTING (CABG) x 3: -FREE LIMA to LAD, -SVG to DIAGONAL, -SVG to OM; ENDOSCOPIC HARVEST GREATER SAPHENOUS VEIN: -Right Thigh  ;  Surgeon: Kerin Perna, MD;  Location: [facility] OR;  Service: Open Heart Surgery;  Laterality: N/A;  . CORONARY STENT INTERVENTION N/A 05/20/2018   Procedure: CORONARY STENT INTERVENTION;  Surgeon: Lyn Records, MD;  Location: [facility] INVASIVE CV LAB;  Service: Cardiovascular;  Laterality: N/A;  . EYE SURGERY    . INTRAVASCULAR PRESSURE WIRE/FFR STUDY N/A 03/01/2018   Procedure: INTRAVASCULAR PRESSURE  WIRE/FFR STUDY;  Surgeon: Swaziland, Peter M, MD;  Location: [facility] INVASIVE CV LAB;  Service: Cardiovascular;  Laterality: N/A;  . LEFT HEART CATH AND CORONARY ANGIOGRAPHY N/A 03/01/2018   Procedure: LEFT HEART CATH AND CORONARY ANGIOGRAPHY;  Surgeon: Swaziland, Peter M, MD;  Location: [facility] INVASIVE CV LAB;  Service: Cardiovascular;  Laterality: N/A;  . LEFT HEART CATH AND CORS/GRAFTS ANGIOGRAPHY N/A 05/16/2018   Procedure: LEFT HEART CATH AND CORS/GRAFTS ANGIOGRAPHY;  Surgeon: Lennette Bihari, MD;  Location: [facility] INVASIVE CV LAB;   Service: Cardiovascular;  Laterality: N/A;  . TEE WITHOUT CARDIOVERSION N/A 03/04/2018   Procedure: TRANSESOPHAGEAL ECHOCARDIOGRAM (TEE);  Surgeon: Donata Clay, Theron Arista, MD;  Location: [facility] OR;  Service: Open Heart Surgery;  Laterality: N/A;    Current Medications: Current Meds  Medication Sig  . aspirin 81 MG tablet Take 1 tablet (81 mg total) by mouth daily.  Marland Kitchen atorvastatin (LIPITOR) 80 MG tablet Take 1 tablet (80 mg total) by mouth daily at 6 PM.  . clopidogrel (PLAVIX) 75 MG tablet Take 1 tablet (75 mg total) by mouth daily with breakfast.  . finasteride (PROSCAR) 5 MG tablet Take 5 mg by mouth daily.  . metFORMIN (GLUCOPHAGE) 500 MG tablet Take 500 mg by mouth 2 (two) times daily with a meal.  . metoprolol tartrate (LOPRESSOR) 25 MG tablet Take 0.5 tablets (12.5 mg total) by mouth 2 (two) times daily.  . nitroGLYCERIN (NITROSTAT) 0.4 MG SL tablet Place 1 tablet (0.4 mg total) under the tongue every 5 (five) minutes x 3 doses as needed for chest pain.  . ranolazine (RANEXA) 1000 MG SR tablet Take 1 tablet (1,000 mg total) by mouth 2 (two) times daily.     Allergies:   Eggs or egg-derived products   Social History   Socioeconomic History  . Marital status: Married    Spouse name: Not on file  . Number of children: Not on file  . Years of education: Not on file  . Highest education level: Not on file  Occupational History  . Not on file  Social Needs  . Financial resource strain: Not on file  . Food insecurity:    Worry: Not on file    Inability: Not on file  . Transportation needs:    Medical: Not on file    Non-medical: Not on file  Tobacco Use  . Smoking status: Never Smoker  . Smokeless tobacco: Never Used  Substance and Sexual Activity  . Alcohol use: Not Currently    Frequency: Never    Comment: 05/20/2018 "stopped before 1999"  . Drug use: Never  . Sexual activity: Not on file  Lifestyle  . Physical activity:    Days per week: Not on file    Minutes per session: Not  on file  . Stress: Not on file  Relationships  . Social connections:    Talks on phone: Not on file    Gets together: Not on file    Attends religious service: Not on file    Active member of club or organization: Not on file    Attends meetings of clubs or organizations: Not on file    Relationship status: Not on file  Other Topics Concern  . Not on file  Social History Narrative  . Not on file     Family History: The patient's family history includes Heart disease in his mother.  ROS:   Please see the history of present illness.    All other systems reviewed and are  negative.  EKGs/Labs/Other Studies Reviewed:    The following studies were reviewed today: I reviewed hospital records and carotid evaluation extensively and discussed report with the patient.   Recent Labs: 03/02/2018: TSH 1.150 03/05/2018: Magnesium 2.1 07/15/2018: ALT 21; B Natriuretic Peptide 141.6; BUN 14; Creatinine, Ser 0.85; Hemoglobin 12.5; Platelets 208; Potassium 3.5; Sodium 132  Recent Lipid Panel    Component Value Date/Time   CHOL 106 07/04/2018 0808   TRIG 94 07/04/2018 0808   HDL 37 (L) 07/04/2018 0808   CHOLHDL 2.9 07/04/2018 0808   CHOLHDL 6.1 02/28/2018 0346   VLDL 55 (H) 02/28/2018 0346   LDLCALC 50 07/04/2018 0808    Physical Exam:    VS:  BP 124/60 (BP Location: Right Arm, Patient Position: Sitting, Cuff Size: Normal)   Pulse 61   Ht 5\' 2"  (1.575 m)   Wt 134 lb (60.8 kg)   SpO2 99%   BMI 24.51 kg/m     Wt Readings from Last 3 Encounters:  08/02/18 134 lb (60.8 kg)  07/15/18 136 lb (61.7 kg)  07/09/18 136 lb (61.7 kg)     GEN: Patient is in no acute distress HEENT: Normal NECK: No JVD; No carotid bruits LYMPHATICS: No lymphadenopathy CARDIAC: Hear sounds regular, 2/6 systolic murmur at the apex. RESPIRATORY:  Clear to auscultation without rales, wheezing or rhonchi  ABDOMEN: Soft, non-tender, non-distended MUSCULOSKELETAL:  No edema; No deformity  SKIN: Warm and  dry NEUROLOGIC:  Alert and oriented x 3 PSYCHIATRIC:  Normal affect   Signed, Garwin Brothersajan R Kaylise Blakeley, MD  08/02/2018 9:05 AM    [facility]

## 2018-08-05 NOTE — Telephone Encounter (Signed)
Called to check in on patient. Per the patient he is alright and does not wish to schedule an appointment for now.

## 2018-08-07 ENCOUNTER — Ambulatory Visit (INDEPENDENT_AMBULATORY_CARE_PROVIDER_SITE_OTHER): Payer: BLUE CROSS/BLUE SHIELD | Admitting: Cardiothoracic Surgery

## 2018-08-07 ENCOUNTER — Encounter: Payer: Self-pay | Admitting: *Deleted

## 2018-08-07 ENCOUNTER — Other Ambulatory Visit: Payer: Self-pay

## 2018-08-07 ENCOUNTER — Encounter: Payer: Self-pay | Admitting: Cardiothoracic Surgery

## 2018-08-07 VITALS — BP 164/80 | HR 63 | Resp 16 | Ht 62.0 in | Wt 134.0 lb

## 2018-08-07 DIAGNOSIS — Z951 Presence of aortocoronary bypass graft: Secondary | ICD-10-CM | POA: Diagnosis not present

## 2018-08-07 DIAGNOSIS — I2510 Atherosclerotic heart disease of native coronary artery without angina pectoris: Secondary | ICD-10-CM | POA: Diagnosis not present

## 2018-08-07 NOTE — Progress Notes (Signed)
Mr. Thedore MinsSingh was referred to [facility] per his and Dr. Zenaida NieceVan Trigt's  request.  The required referral form and requested information was faxed.

## 2018-08-07 NOTE — Progress Notes (Signed)
PCP is Achreja, Youlanda MightyManjeet Kaur, MD Referring Provider is Lyn RecordsSmith, Henry W, MD  Chief Complaint  Patient presents with  . Routine Post Op    3 month f/u, s/p CABG x3 03/04/18.Marland Kitchen.Marland Kitchen.STENT to occluded graft 05/20/18    HPI: Patient returns for scheduled follow-up visit 6 months after CABG x3.  Patient had very small saphenous vein conduit despite exploration of both legs.  The mammary artery also was a 1 mm vessel.  The native RCA was small atretic and not graftable.  About 3 months after surgery he had graft closure and a non-STEMI.  At that time he had PCI of his LAD and circumflex lesions with good result.  An echocardiogram was performed last month which I personally reviewed showing inferior hypokinesia with EF of 45% and mild-moderate mitral vegetation.  The patient has not been having much chest pain.  He is still somewhat weak and has had some orthostatic dizziness probably related to his diabetes, inappropriate use of nitroglycerin, and the Proscar alpha-blocker for his prostate disease. His chest x-ray is clear.  His surgical incisions are well-healed. He appears to be committed to following a heart healthy lifestyle with diet, exercise, and compliance with medications.  He was recently started on Ranexa by his cardiologist. 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  He never started cardiac rehab after his surgery this past summer and we will make the referral to the cardiac [facility] program at [facility] which should benefit the patient significantly.  Past Medical History:  Diagnosis Date  . Coronary artery disease   . Heart murmur   . High cholesterol   . Hypertension   . NSTEMI (non-ST elevated myocardial infarction) (HCC) 05/14/2018  . PONV (postoperative nausea and vomiting)   . Syncope   . Type II diabetes mellitus (HCC)     Past Surgical History:  Procedure Laterality Date  . CATARACT EXTRACTION W/ INTRAOCULAR  LENS  IMPLANT, BILATERAL Bilateral 2012-2017  . CORONARY ARTERY BYPASS GRAFT N/A 03/04/2018   Procedure: CORONARY ARTERY BYPASS GRAFTING (CABG) x 3: -FREE LIMA to LAD, -SVG to DIAGONAL, -SVG to OM; ENDOSCOPIC HARVEST GREATER SAPHENOUS VEIN: -Right Thigh  ;  Surgeon: Kerin PernaVan Trigt, Jasreet Dickie, MD;  [facility];  Service: Open Heart Surgery;  Laterality: N/A;  . CORONARY STENT INTERVENTION N/A 05/20/2018   Procedure: CORONARY STENT INTERVENTION;  Surgeon: Lyn RecordsSmith, Henry W, MD;  Location: [facility] INVASIVE CV LAB;  Service: Cardiovascular;  Laterality: N/A;  . EYE SURGERY    . INTRAVASCULAR PRESSURE WIRE/FFR STUDY N/A 03/01/2018   Procedure: INTRAVASCULAR PRESSURE WIRE/FFR STUDY;  Surgeon: SwazilandJordan, Zella Dewan M, MD;  [facility];  Service: Cardiovascular;  Laterality: N/A;  . LEFT HEART CATH AND CORONARY ANGIOGRAPHY N/A 03/01/2018   Procedure: LEFT HEART CATH AND CORONARY ANGIOGRAPHY;  Surgeon: SwazilandJordan, Garlene Apperson M, MD;  [facility];  Service: Cardiovascular;  Laterality: N/A;  . LEFT HEART CATH AND CORS/GRAFTS ANGIOGRAPHY N/A 05/16/2018   Procedure: LEFT HEART CATH AND CORS/GRAFTS ANGIOGRAPHY;  Surgeon: Lennette BihariKelly, Thomas A, MD;  Location: [facility] INVASIVE CV LAB;  Service: Cardiovascular;  Laterality: N/A;  . TEE WITHOUT CARDIOVERSION N/A 03/04/2018   Procedure: TRANSESOPHAGEAL ECHOCARDIOGRAM (TEE);  Surgeon: Donata ClayVan Trigt, Theron AristaPeter, MD;  [facility];  Service: Open Heart Surgery;  Laterality: N/A;    Family History  Problem Relation Age of Onset  . Heart disease Mother     Social History Social History  Tobacco Use  . Smoking status: Never Smoker  . Smokeless tobacco: Never Used  Substance Use Topics  . Alcohol use: Not Currently    Frequency: Never    Comment: 05/20/2018 "stopped before 1999"  . Drug use: Never    Current Outpatient Medications  Medication Sig Dispense Refill  . aspirin 81 MG tablet Take 1 tablet (81 mg total) by mouth daily.    Marland Kitchen atorvastatin (LIPITOR) 80 MG tablet Take 1  tablet (80 mg total) by mouth daily at 6 PM. 90 tablet 3  . clopidogrel (PLAVIX) 75 MG tablet Take 1 tablet (75 mg total) by mouth daily with breakfast. 90 tablet 2  . finasteride (PROSCAR) 5 MG tablet Take 5 mg by mouth daily.    . metFORMIN (GLUCOPHAGE) 500 MG tablet Take 500 mg by mouth 2 (two) times daily with a meal.    . metoprolol tartrate (LOPRESSOR) 25 MG tablet Take 0.5 tablets (12.5 mg total) by mouth 2 (two) times daily. 33 tablet 11  . nitroGLYCERIN (NITROSTAT) 0.4 MG SL tablet Place 1 tablet (0.4 mg total) under the tongue every 5 (five) minutes x 3 doses as needed for chest pain. 25 tablet 12  . ranolazine (RANEXA) 1000 MG SR tablet Take 1 tablet (1,000 mg total) by mouth 2 (two) times daily. 180 tablet 2   No current facility-administered medications for this visit.     Allergies  Allergen Reactions  . Eggs Or Egg-Derived Products Itching and Rash    Review of Systems  Multiple complaints about anxiety, dizziness, and stress.  BP (!) 164/80 (BP Location: Right Arm, Patient Position: Sitting, Cuff Size: Large)   Pulse 63   Resp 16   Ht 5\' 2"  (1.575 m)   Wt 134 lb (60.8 kg)   SpO2 99% Comment: RA  BMI 24.51 kg/m  Physical Exam Alert and comfortable Lungs clear Heart rhythm regular 2/6 systolic murmur No peripheral edema Surgical incisions well-healed  Diagnostic Tests: Most recent echocardiogram images personally reviewed and counseled with patient  Impression: Severe diabetic coronary disease, failure with CABG but good result with PCI.  Continue current medical therapy and hopefully patient will start cardiac rehab at [facility]  Plan: Referral to cardiac rehab at [facility] and I will follow-up patient in 3 months.   Mikey Bussing, MD [facility] [phone]

## 2018-08-16 IMAGING — DX DG CHEST 2V
2 series · 2 of 2 positions shown · non-contrast
Comparison: Chest x-ray dated 03/08/2018

CLINICAL DATA: CABG on 03/04/2018. Right apical pneumothorax and
small bilateral pleural effusions on chest x-ray of 03/08/2018.

EXAM:
CHEST - 2 VIEW

[dg chest 2 view (1 of 2)]
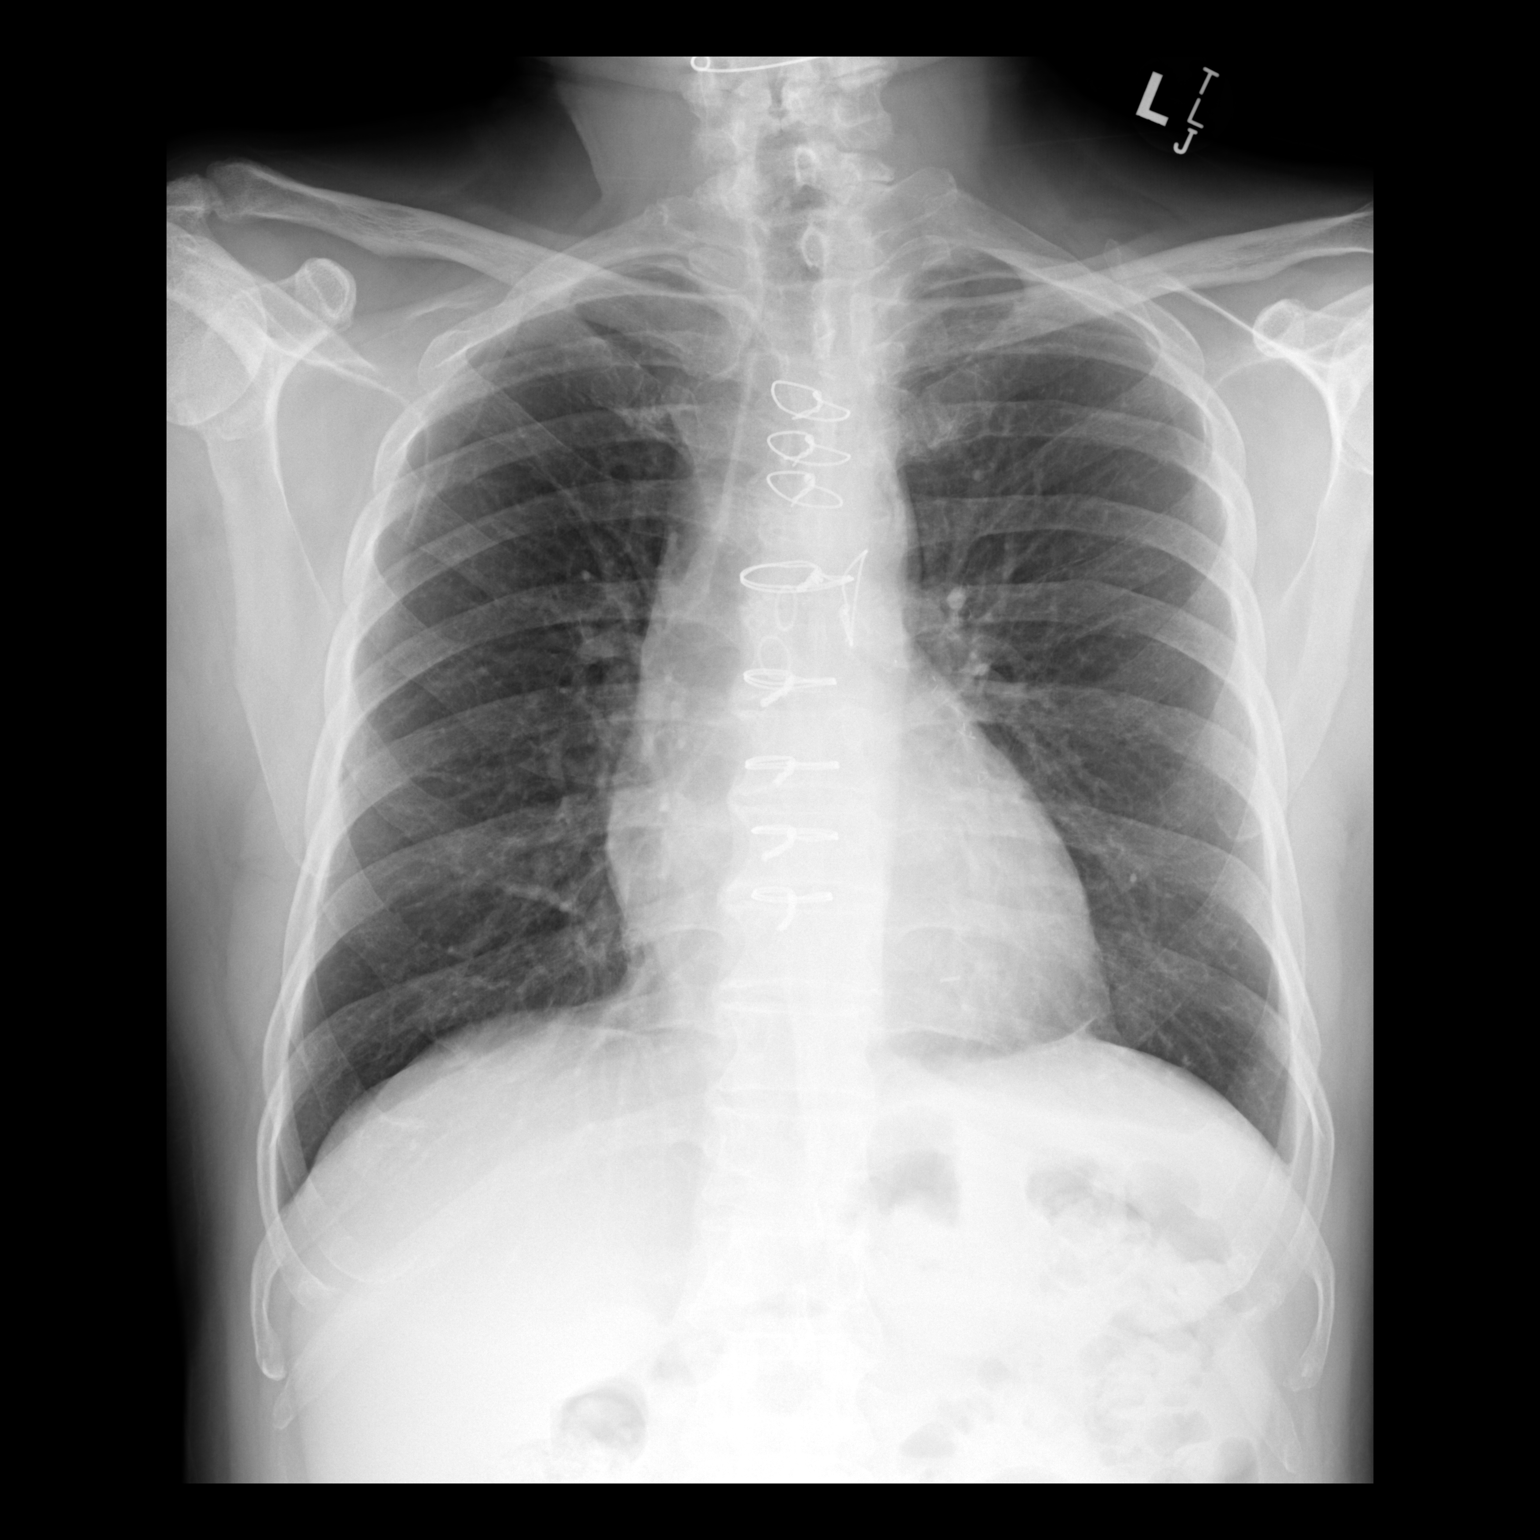

[dg chest 2 view (2 of 2)]
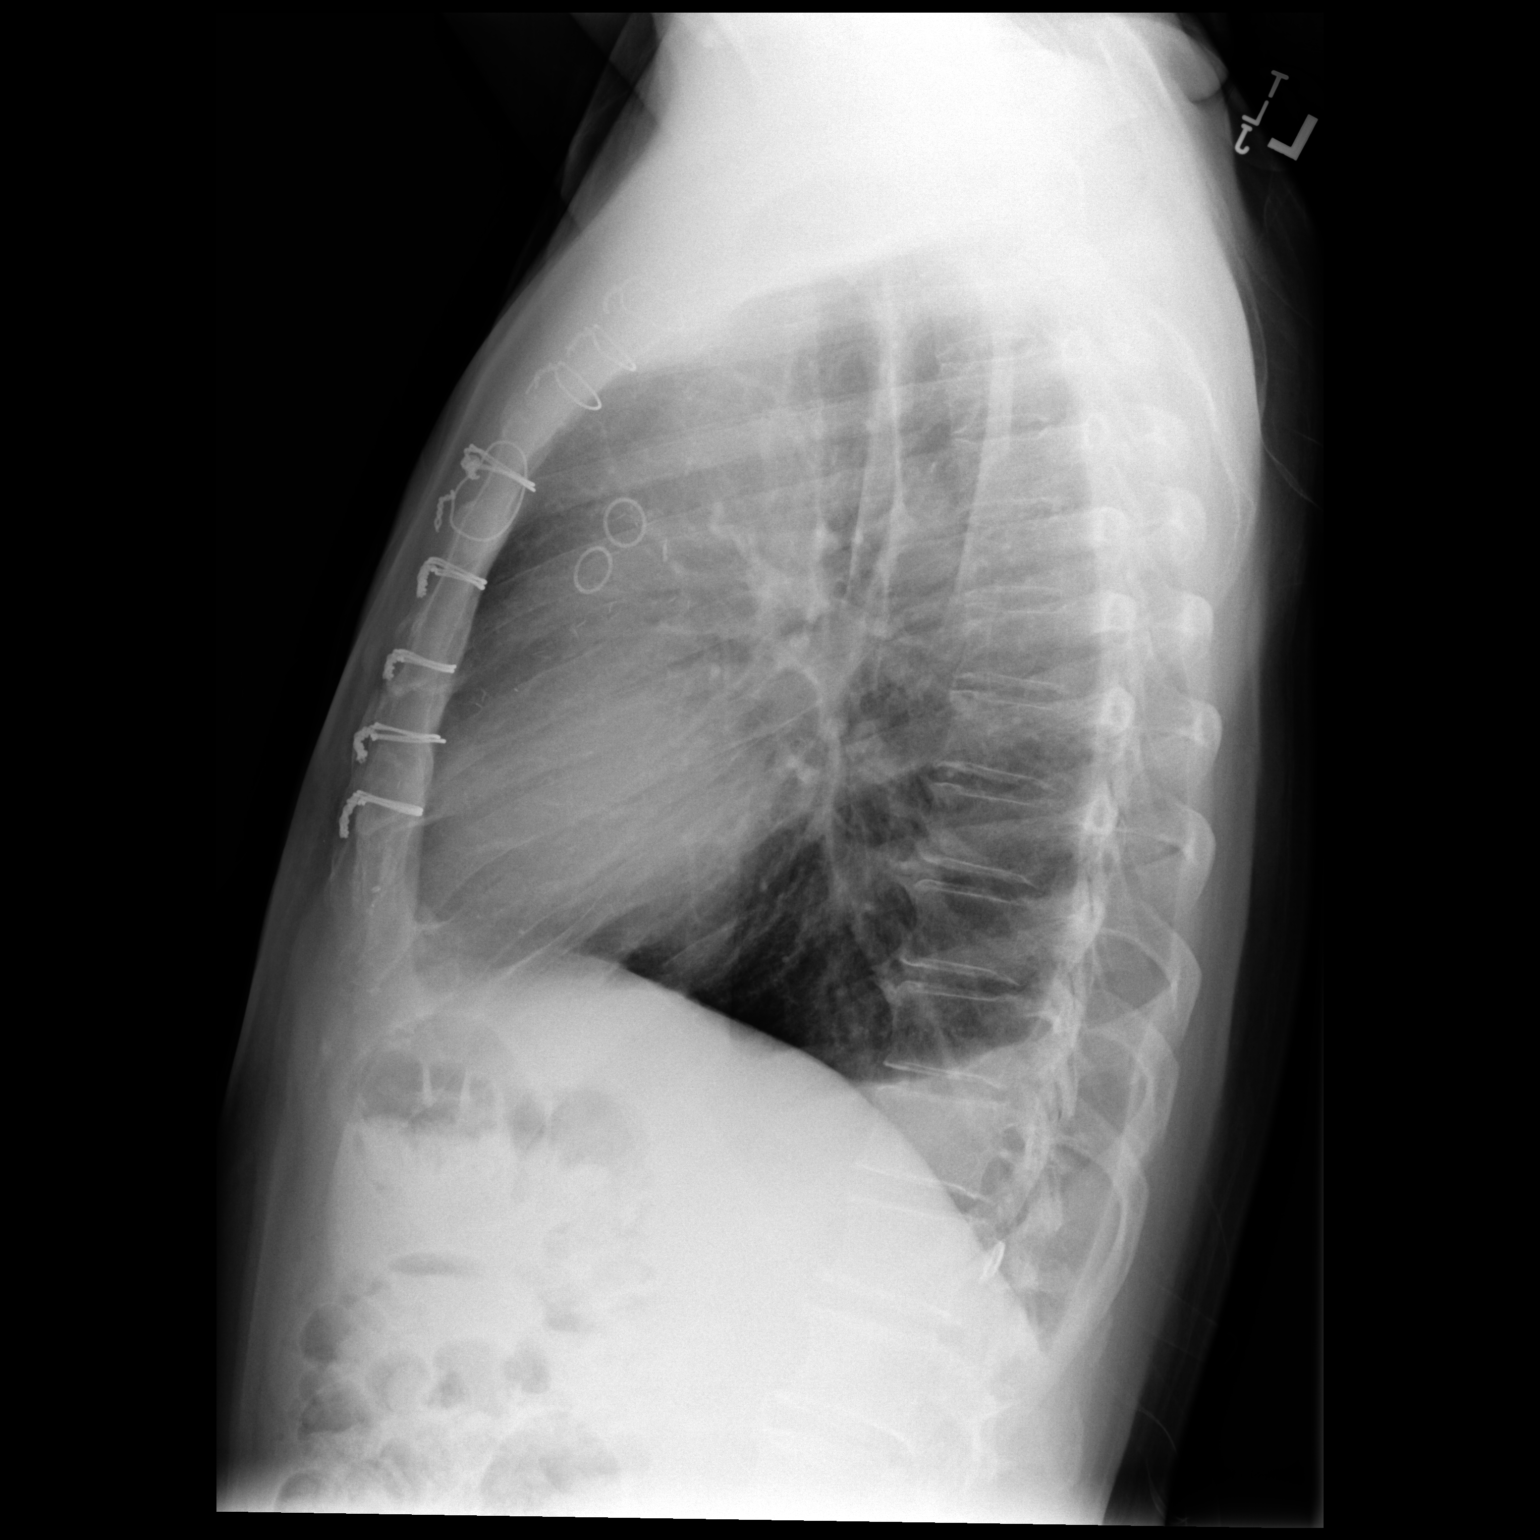

[2 of 2 positions shown; findings below may reference images not displayed]

FINDINGS: There has been complete resolution of the small right apical
pneumothorax noted on the prior study. There has been complete
resolution of the right pleural effusion. There is a minimal
residual left effusion.

Heart size and vascularity are normal. No infiltrates. No acute bone
abnormality.
IMPRESSION: 1. Resolution of tiny right apical pneumothorax.
2. Complete resolution of right effusion.
3. Decreased small residual left effusion.

## 2018-08-19 ENCOUNTER — Ambulatory Visit: Payer: Self-pay | Admitting: Cardiology

## 2018-09-02 ENCOUNTER — Ambulatory Visit: Payer: Self-pay | Admitting: Cardiology

## 2018-11-08 DIAGNOSIS — Z736 Limitation of activities due to disability: Secondary | ICD-10-CM

## 2018-11-13 ENCOUNTER — Ambulatory Visit: Payer: Self-pay | Admitting: Cardiothoracic Surgery

## 2019-01-15 ENCOUNTER — Other Ambulatory Visit: Payer: Self-pay | Admitting: Cardiothoracic Surgery

## 2019-01-15 DIAGNOSIS — E785 Hyperlipidemia, unspecified: Secondary | ICD-10-CM

## 2019-01-23 ENCOUNTER — Other Ambulatory Visit: Payer: Self-pay | Admitting: Cardiology

## 2019-01-23 MED ORDER — RANOLAZINE ER 1000 MG PO TB12: 1000.0000 mg | ORAL_TABLET | Freq: Two times a day (BID) | ORAL | 1 refills | Status: DC

## 2019-01-23 NOTE — Telephone Encounter (Signed)
Ranexa 1000 mg twice daily refilled.  

## 2019-01-23 NOTE — Telephone Encounter (Signed)
°*  STAT* If patient is at the pharmacy, call can be transferred to refill team.   1. Which medications need to be refilled? (please list name of each medication and dose if known) ranolazine (RANEXA) 1000 MG SR tablet  2. Which pharmacy/location (including street and city if local pharmacy) is medication to be sent to?[id number] [place], [place] - [address] AT [facility] [address] [phone] (Phone) [phone] (Fax)    3. Do they need a 30 day or 90 day supply? 90 day

## 2019-02-21 ENCOUNTER — Ambulatory Visit (INDEPENDENT_AMBULATORY_CARE_PROVIDER_SITE_OTHER): Payer: Self-pay | Admitting: Cardiology

## 2019-02-21 ENCOUNTER — Encounter: Payer: Self-pay | Admitting: Cardiology

## 2019-02-21 ENCOUNTER — Other Ambulatory Visit: Payer: Self-pay

## 2019-02-21 VITALS — BP 142/78 | HR 57 | Ht 62.0 in | Wt 135.0 lb

## 2019-02-21 DIAGNOSIS — E785 Hyperlipidemia, unspecified: Secondary | ICD-10-CM

## 2019-02-21 DIAGNOSIS — E119 Type 2 diabetes mellitus without complications: Secondary | ICD-10-CM

## 2019-02-21 DIAGNOSIS — Z951 Presence of aortocoronary bypass graft: Secondary | ICD-10-CM

## 2019-02-21 DIAGNOSIS — I10 Essential (primary) hypertension: Secondary | ICD-10-CM

## 2019-02-21 DIAGNOSIS — I6529 Occlusion and stenosis of unspecified carotid artery: Secondary | ICD-10-CM

## 2019-02-21 DIAGNOSIS — Z9861 Coronary angioplasty status: Secondary | ICD-10-CM

## 2019-02-21 DIAGNOSIS — I2510 Atherosclerotic heart disease of native coronary artery without angina pectoris: Secondary | ICD-10-CM

## 2019-02-21 DIAGNOSIS — Z1329 Encounter for screening for other suspected endocrine disorder: Secondary | ICD-10-CM

## 2019-02-21 HISTORY — DX: Occlusion and stenosis of unspecified carotid artery: I65.29

## 2019-02-21 NOTE — Patient Instructions (Addendum)
Medication Instructions:  Your physician recommends that you continue on your current medications as directed. Please refer to the Current Medication list given to you today.  If you need a refill on your cardiac medications before your next appointment, please call your pharmacy.   Lab work: Your physician recommends that you return FASTING in November for VMP, CBC, TSH, LFT and lipid panel to be drawn.  If you have labs (blood work) drawn today and your tests are completely normal, you will receive your results only by: Marland Kitchen. MyChart Message (if you have MyChart) OR . A paper copy in the mail If you have any lab test that is abnormal or we need to change your treatment, we will call you to review the results.  Testing/Procedures: Your physician has requested that you have a carotid duplex. This test is an ultrasound of the carotid arteries in your neck. It looks at blood flow through these arteries that supply the brain with blood. Allow one hour for this exam. There are no restrictions or special instructions.  Follow-Up: At [facility], you and your health needs are our priority.  As part of our continuing mission to provide you with exceptional heart care, we have created designated Provider Care Teams.  These Care Teams include your primary Cardiologist (physician) and Advanced Practice Providers (APPs -  Physician Assistants and Nurse Practitioners) who all work together to provide you with the care you need, when you need it. You will need a follow up appointment in 6 months.   Any Other Special Instructions Will Be Listed Below  Vascular Ultrasound A vascular ultrasound is a painless test that is done to see if you have blood flow problems or clots in your blood vessels. It uses harmless sound waves to take pictures of the arteries and veins in your body. The pictures are taken by passing a device (transducer) over certain areas of your body. Tell a health care provider about:  Any  allergies you have.  All medicines you are taking, including vitamins, herbs, eye drops, creams, and over-the-counter medicines.  Any blood disorders you have.  Any surgeries you have had.  Any medical conditions you have.  Whether you are pregnant or may be pregnant. What are the risks? Generally, this is a safe procedure. There are no known risks or complications that arise from having an ultrasound. What happens before the procedure?  If the ultrasound scan involves your upper abdomen, you may be told not to eat or chew gum the morning of your exam. Follow your health care provider's instructions.  Do not smoke or use nicotine products at least 30 minutes before the exam.  During the test, a gel will be applied to your skin. What happens during the procedure?   A gel will be applied to your skin. It may feel cool.  The transducer will be placed on the area to be examined.  Pictures will be taken. They will be displayed on one or more monitors that look like small television screens. What happens after the procedure?  You can safely drive home immediately after your exam.  You may resume your normal diet and activities.  Keep all follow-up visits as told by your health care provider. This is important.  It is up to you to get your test results. Ask your health care provider, or the department that is doing the test: ? When will my results be ready? ? How will I get my results? ? What are my  treatment options? ? What other tests do I need? ? What are my next steps? Summary  A vascular ultrasound is a painless test that is done to see if you have blood flow problems or clots in your blood vessels. It uses harmless sound waves to take pictures of the arteries and veins in your body.  Generally, this is a safe procedure. There are no known risks or complications that arise from having an ultrasound.  A gel will be applied to your skin. It may feel cool. The device that  takes the pictures (transducer) will then be placed on the area to be examined.  It is up to you to get your test results. Ask your health care provider or the department that is doing the test when your results will be ready and how you will get your results. This information is not intended to replace advice given to you by your health care provider. Make sure you discuss any questions you have with your health care provider. Document Released: 08/25/2004 Document Revised: 12/03/2018 Document Reviewed: 09/20/2017 Elsevier Patient Education  2020 [employer].

## 2019-02-21 NOTE — Addendum Note (Signed)
Addended by: Beckey Rutter on: 02/21/2019 04:46 PM   Modules accepted: Orders

## 2019-02-21 NOTE — Progress Notes (Signed)
Cardiology Office Note:    Date:  02/21/2019   ID:  Nicholas BignessGurdev [name], DOB 06/29/1956, MRN [id number]  PCP:  Anselmo PicklerAchreja, Manjeet Kaur, MD  Cardiologist:  Garwin Brothersajan R Quintan Saldivar, MD   Referring MD: Anselmo PicklerAchreja, Manjeet Kaur, *    ASSESSMENT:    1. CAD S/P percutaneous coronary angioplasty   2. Essential hypertension   3. Coronary artery disease involving native coronary artery of native heart without angina pectoris   4. Non-insulin dependent type 2 diabetes mellitus (HCC)   5. S/P CABG x 3   6. Dyslipidemia, goal LDL below 70   7. Stenosis of carotid artery, unspecified laterality    PLAN:    In order of problems listed above:  1. Secondary prevention stressed with the patient.  Importance of compliance with diet and medication stressed and he vocalized understanding.  His blood pressure is stable. 2. Importance of regular exercise stressed 30 minutes a day at least 5 days a week and he plans to do so. 3. Diabetes mellitus: Managed by his primary care physician. 4. Mixed dyslipidemia: Lipids are fine and I reviewed them.  He will continue current medications 5. Bilateral carotid artery disease: We will do a repeat study to assess this as it is been about 7 to 8 months.  Patient is on intensive statin therapy. 6. Patient will be seen in follow-up appointment in 6 months or earlier if the patient has any concerns    Medication Adjustments/Labs and Tests Ordered: Current medicines are reviewed at length with the patient today.  Concerns regarding medicines are outlined above.  No orders of the defined types were placed in this encounter.  No orders of the defined types were placed in this encounter.    No chief complaint on file.    History of Present Illness:    Nicholas [name] is a 63 y.o. male with past medical history of coronary artery disease post CABG surgery, essential hypertension, dyslipidemia and diabetes mellitus.  Patient denies any problems at this time and takes care of  activities of daily living.  No chest pain orthopnea or PND.  He walks 15 to 20 minutes on a regular basis.  At the time of my evaluation, the patient is alert awake oriented and in no distress.  Past Medical History:  Diagnosis Date   Coronary artery disease    Heart murmur    High cholesterol    Hypertension    NSTEMI (non-ST elevated myocardial infarction) (HCC) 05/14/2018   PONV (postoperative nausea and vomiting)    Syncope    Type II diabetes mellitus (HCC)     Past Surgical History:  Procedure Laterality Date   CATARACT EXTRACTION W/ INTRAOCULAR LENS  IMPLANT, BILATERAL Bilateral 2012-2017   CORONARY ARTERY BYPASS GRAFT N/A 03/04/2018   Procedure: CORONARY ARTERY BYPASS GRAFTING (CABG) x 3: -FREE LIMA to LAD, -SVG to DIAGONAL, -SVG to OM; ENDOSCOPIC HARVEST GREATER SAPHENOUS VEIN: -Right Thigh  ;  Surgeon: Kerin PernaVan Trigt, Peter, MD;  [facility];  Service: Open Heart Surgery;  Laterality: N/A;   CORONARY STENT INTERVENTION N/A 05/20/2018   Procedure: CORONARY STENT INTERVENTION;  Surgeon: Lyn RecordsSmith, Henry W, MD;  [facility];  Service: Cardiovascular;  Laterality: N/A;   EYE SURGERY     INTRAVASCULAR PRESSURE WIRE/FFR STUDY N/A 03/01/2018   Procedure: INTRAVASCULAR PRESSURE WIRE/FFR STUDY;  Surgeon: SwazilandJordan, Peter M, MD;  [facility];  Service: Cardiovascular;  Laterality: N/A;   LEFT HEART CATH AND CORONARY ANGIOGRAPHY N/A  03/01/2018   Procedure: LEFT HEART CATH AND CORONARY ANGIOGRAPHY;  Surgeon: Martinique, Peter M, MD;  Location: [place] CV LAB;  Service: Cardiovascular;  Laterality: N/A;   LEFT HEART CATH AND CORS/GRAFTS ANGIOGRAPHY N/A 05/16/2018   Procedure: LEFT HEART CATH AND CORS/GRAFTS ANGIOGRAPHY;  Surgeon: Troy Sine, MD;  Location: [place] CV LAB;  Service: Cardiovascular;  Laterality: N/A;   TEE WITHOUT CARDIOVERSION N/A 03/04/2018   Procedure: TRANSESOPHAGEAL ECHOCARDIOGRAM (TEE);  Surgeon: Prescott Gum, Collier Salina, MD;  Location:  [place];  Service: Open Heart Surgery;  Laterality: N/A;    Current Medications: Current Meds  Medication Sig   aspirin 81 MG tablet Take 1 tablet (81 mg total) by mouth daily.   atorvastatin (LIPITOR) 80 MG tablet Take 1 tablet (80 mg total) by mouth daily at 6 PM.   clopidogrel (PLAVIX) 75 MG tablet Take 1 tablet (75 mg total) by mouth daily with breakfast.   finasteride (PROSCAR) 5 MG tablet Take 5 mg by mouth daily.   metFORMIN (GLUCOPHAGE) 500 MG tablet Take 500 mg by mouth 2 (two) times daily with a meal.   metoprolol tartrate (LOPRESSOR) 25 MG tablet Take 0.5 tablets (12.5 mg total) by mouth 2 (two) times daily.   nitroGLYCERIN (NITROSTAT) 0.4 MG SL tablet Place 1 tablet (0.4 mg total) under the tongue every 5 (five) minutes x 3 doses as needed for chest pain.   ranolazine (RANEXA) 1000 MG SR tablet Take 1 tablet (1,000 mg total) by mouth 2 (two) times daily.     Allergies:   Eggs or egg-derived products   Social History   Socioeconomic History   Marital status: Married    Spouse name: Not on file   Number of children: Not on file   Years of education: Not on file   Highest education level: Not on file  Occupational History   Not on file  Social Needs   Financial resource strain: Not on file   Food insecurity    Worry: Not on file    Inability: Not on file   Transportation needs    Medical: Not on file    Non-medical: Not on file  Tobacco Use   Smoking status: Never Smoker   Smokeless tobacco: Never Used  Substance and Sexual Activity   Alcohol use: Not Currently    Frequency: Never    Comment: 05/20/2018 "stopped before 1999"   Drug use: Never   Sexual activity: Not on file  Lifestyle   Physical activity    Days per week: Not on file    Minutes per session: Not on file   Stress: Not on file  Relationships   Social connections    Talks on phone: Not on file    Gets together: Not on file    Attends religious service: Not on file     Active member of club or organization: Not on file    Attends meetings of clubs or organizations: Not on file    Relationship status: Not on file  Other Topics Concern   Not on file  Social History Narrative   Not on file     Family History: The patient's family history includes Heart disease in his mother.  ROS:   Please see the history of present illness.    All other systems reviewed and are negative.  EKGs/Labs/Other Studies Reviewed:    The following studies were reviewed today: I reviewed records and lab work that he brought from the other doctors office.  Recent Labs: 03/02/2018: TSH 1.150 03/05/2018: Magnesium 2.1 07/15/2018: ALT 21; B Natriuretic Peptide 141.6; BUN 14; Creatinine, Ser 0.85; Hemoglobin 12.5; Platelets 208; Potassium 3.5; Sodium 132  Recent Lipid Panel    Component Value Date/Time   CHOL 106 07/04/2018 0808   TRIG 94 07/04/2018 0808   HDL 37 (L) 07/04/2018 0808   CHOLHDL 2.9 07/04/2018 0808   CHOLHDL 6.1 02/28/2018 0346   VLDL 55 (H) 02/28/2018 0346   LDLCALC 50 07/04/2018 0808    Physical Exam:    VS:  BP (!) 142/78 (BP Location: Left Arm, Patient Position: Sitting, Cuff Size: Normal)    Pulse (!) 57    Ht 5\' 2"  (1.575 m)    Wt 135 lb (61.2 kg)    SpO2 100%    BMI 24.69 kg/m     Wt Readings from Last 3 Encounters:  02/21/19 135 lb (61.2 kg)  08/07/18 134 lb (60.8 kg)  08/02/18 134 lb (60.8 kg)     GEN: Patient is in no acute distress HEENT: Normal NECK: No JVD; No carotid bruits LYMPHATICS: No lymphadenopathy CARDIAC: Hear sounds regular, 2/6 systolic murmur at the apex. RESPIRATORY:  Clear to auscultation without rales, wheezing or rhonchi  ABDOMEN: Soft, non-tender, non-distended MUSCULOSKELETAL:  No edema; No deformity  SKIN: Warm and dry NEUROLOGIC:  Alert and oriented x 3 PSYCHIATRIC:  Normal affect   Signed, Garwin Brothersajan R Emileigh Kellett, MD  02/21/2019 4:31 PM    [facility]

## 2019-03-17 ENCOUNTER — Telehealth: Payer: Self-pay | Admitting: Cardiology

## 2019-03-17 NOTE — Telephone Encounter (Signed)
°*  STAT* If patient is at the pharmacy, call can be transferred to refill team.   1. Which medications need to be refilled? (please list name of each medication and dose if known) clopidogrel 75mg , ranolazine 1000mg   2. Which pharmacy/location (including street and city if local pharmacy) is medication to be sent to?[facility] on [address]  3. Do they need a 30 day or 90 day supply? [place]

## 2019-03-19 ENCOUNTER — Other Ambulatory Visit: Payer: Self-pay

## 2019-03-19 MED ORDER — RANOLAZINE ER 1000 MG PO TB12: 1000.0000 mg | ORAL_TABLET | Freq: Two times a day (BID) | ORAL | 2 refills | Status: DC

## 2019-03-19 MED ORDER — CLOPIDOGREL BISULFATE 75 MG PO TABS: 75.0000 mg | ORAL_TABLET | Freq: Every day | ORAL | 2 refills | Status: DC

## 2019-04-08 DIAGNOSIS — Z736 Limitation of activities due to disability: Secondary | ICD-10-CM

## 2019-04-10 ENCOUNTER — Ambulatory Visit (INDEPENDENT_AMBULATORY_CARE_PROVIDER_SITE_OTHER): Payer: Medicare Other

## 2019-04-10 ENCOUNTER — Other Ambulatory Visit: Payer: Self-pay

## 2019-04-10 DIAGNOSIS — I2510 Atherosclerotic heart disease of native coronary artery without angina pectoris: Secondary | ICD-10-CM | POA: Diagnosis not present

## 2019-04-10 DIAGNOSIS — E785 Hyperlipidemia, unspecified: Secondary | ICD-10-CM | POA: Diagnosis not present

## 2019-04-10 DIAGNOSIS — Z9861 Coronary angioplasty status: Secondary | ICD-10-CM

## 2019-04-10 DIAGNOSIS — I6529 Occlusion and stenosis of unspecified carotid artery: Secondary | ICD-10-CM | POA: Diagnosis not present

## 2019-04-10 NOTE — Progress Notes (Signed)
Carotid duplex exam has been performed. Bilateral ICA stenosis was identified Right ICA  60-79% Left ICA 40-59%, Intimal thickening Bilaterally.   Jimmy Vara Mairena RDCS,  RVT

## 2019-04-14 ENCOUNTER — Encounter: Payer: Self-pay | Admitting: *Deleted

## 2019-04-17 ENCOUNTER — Other Ambulatory Visit: Payer: Self-pay | Admitting: Cardiology

## 2019-04-17 MED ORDER — METOPROLOL TARTRATE 25 MG PO TABS: 12.5000 mg | ORAL_TABLET | Freq: Two times a day (BID) | ORAL | 1 refills | Status: DC

## 2019-04-17 NOTE — Telephone Encounter (Signed)
°*  STAT* If patient is at the pharmacy, call can be transferred to refill team.   1. Which medications need to be refilled? (please list name of each medication and dose if known) metoprolol tartrate (LOPRESSOR) 25 MG tablet   2. Which pharmacy/location (including street and city if local pharmacy) is medication to be sent to?  [facility] [place], [place] AT [facility] [address] [place] [phone] (Phone) [phone] (Fax)    3. Do they need a 30 day or 90 day supply? 90 day

## 2019-04-17 NOTE — Telephone Encounter (Signed)
Refill for metoprolol tartrate sent to [facility] in [place] as requested.  

## 2019-06-09 ENCOUNTER — Telehealth: Payer: Self-pay | Admitting: Cardiology

## 2019-06-09 ENCOUNTER — Other Ambulatory Visit: Payer: Self-pay | Admitting: Physician Assistant

## 2019-06-09 DIAGNOSIS — E785 Hyperlipidemia, unspecified: Secondary | ICD-10-CM

## 2019-06-09 MED ORDER — CLOPIDOGREL BISULFATE 75 MG PO TABS: ORAL_TABLET | ORAL | 2 refills | Status: DC

## 2019-06-09 MED ORDER — METOPROLOL TARTRATE 25 MG PO TABS: 12.5000 mg | ORAL_TABLET | Freq: Two times a day (BID) | ORAL | 1 refills | Status: DC

## 2019-06-09 MED ORDER — ATORVASTATIN CALCIUM 80 MG PO TABS: 80.0000 mg | ORAL_TABLET | Freq: Every day | ORAL | 3 refills | Status: DC

## 2019-06-09 NOTE — Telephone Encounter (Signed)
°*  STAT* If patient is at the pharmacy, call can be transferred to refill team.   1. Which medications need to be refilled? (please list name of each medication and dose if known) Cholesterol 18mg  medicine ????? 2. Which pharmacy/location (including street and city if local pharmacy) is medication to be sent to?[facility] on [address] and [address] in [place]   3. Do they need a 30 day or 90 day supply? 90   He is out!!

## 2019-06-09 NOTE — Addendum Note (Signed)
Addended by: Beckey Rutter on: 06/09/2019 04:54 PM   Modules accepted: Orders

## 2019-06-12 ENCOUNTER — Other Ambulatory Visit: Payer: Self-pay

## 2019-06-12 MED ORDER — CLOPIDOGREL BISULFATE 75 MG PO TABS: ORAL_TABLET | ORAL | 2 refills | Status: DC

## 2019-08-09 LAB — LIPID PANEL
Chol/HDL Ratio: 2.8 ratio (ref 0.0–5.0)
Cholesterol, Total: 113 mg/dL (ref 100–199)
HDL: 40 mg/dL (ref >39)
LDL Chol Calc (NIH): 49 mg/dL (ref 0–99)
Triglycerides: 134 mg/dL (ref 0–149)
VLDL Cholesterol Cal: 24 mg/dL (ref 5–40)

## 2019-08-09 LAB — BASIC METABOLIC PANEL
BUN/Creatinine Ratio: 15 (ref 10–24)
BUN: 14 mg/dL (ref 8–27)
CO2: 22 mmol/L (ref 20–29)
Calcium: 9.2 mg/dL (ref 8.6–10.2)
Chloride: 101 mmol/L (ref 96–106)
Creatinine, Ser: 0.95 mg/dL (ref 0.76–1.27)
GFR calc Af Amer: 97 mL/min/{1.73_m2} (ref >59)
GFR calc non Af Amer: 84 mL/min/{1.73_m2} (ref >59)
Glucose: 110 mg/dL — ABNORMAL HIGH (ref 65–99)
Potassium: 4.9 mmol/L (ref 3.5–5.2)
Sodium: 139 mmol/L (ref 134–144)

## 2019-08-09 LAB — HEPATIC FUNCTION PANEL
ALT: 16 IU/L (ref 0–44)
AST: 15 IU/L (ref 0–40)
Albumin: 4.3 g/dL (ref 3.8–4.8)
Alkaline Phosphatase: 71 IU/L (ref 39–117)
Bilirubin Total: 0.4 mg/dL (ref 0.0–1.2)
Bilirubin, Direct: 0.13 mg/dL (ref 0.00–0.40)
Total Protein: 6.7 g/dL (ref 6.0–8.5)

## 2019-08-09 LAB — TSH: TSH: 1.44 u[IU]/mL (ref 0.450–4.500)

## 2019-08-18 ENCOUNTER — Ambulatory Visit (INDEPENDENT_AMBULATORY_CARE_PROVIDER_SITE_OTHER): Payer: Medicare Other | Admitting: Cardiology

## 2019-08-18 ENCOUNTER — Encounter: Payer: Self-pay | Admitting: *Deleted

## 2019-08-18 ENCOUNTER — Other Ambulatory Visit: Payer: Self-pay

## 2019-08-18 ENCOUNTER — Encounter: Payer: Self-pay | Admitting: Cardiology

## 2019-08-18 VITALS — BP 158/64 | HR 57 | Ht 62.0 in | Wt 138.8 lb

## 2019-08-18 DIAGNOSIS — Z951 Presence of aortocoronary bypass graft: Secondary | ICD-10-CM

## 2019-08-18 DIAGNOSIS — E785 Hyperlipidemia, unspecified: Secondary | ICD-10-CM

## 2019-08-18 DIAGNOSIS — I2510 Atherosclerotic heart disease of native coronary artery without angina pectoris: Secondary | ICD-10-CM | POA: Diagnosis not present

## 2019-08-18 DIAGNOSIS — E088 Diabetes mellitus due to underlying condition with unspecified complications: Secondary | ICD-10-CM | POA: Diagnosis not present

## 2019-08-18 MED ORDER — CLOPIDOGREL BISULFATE 75 MG PO TABS: ORAL_TABLET | ORAL | 2 refills | Status: DC

## 2019-08-18 MED ORDER — NITROGLYCERIN 0.4 MG SL SUBL: 0.4000 mg | SUBLINGUAL_TABLET | SUBLINGUAL | 12 refills | Status: DC | PRN

## 2019-08-18 MED ORDER — METOPROLOL TARTRATE 25 MG PO TABS: 12.5000 mg | ORAL_TABLET | Freq: Two times a day (BID) | ORAL | 1 refills | Status: DC

## 2019-08-18 MED ORDER — ATORVASTATIN CALCIUM 80 MG PO TABS: 80.0000 mg | ORAL_TABLET | Freq: Every day | ORAL | 3 refills | Status: DC

## 2019-08-18 MED ORDER — RANOLAZINE ER 1000 MG PO TB12: 1000.0000 mg | ORAL_TABLET | Freq: Two times a day (BID) | ORAL | 2 refills | Status: DC

## 2019-08-18 NOTE — Progress Notes (Signed)
Cardiology Office Note:    Date:  08/18/2019   ID:  Nicholas BignessGurdev Sanger, DOB 1955/10/12, MRN [id number]  PCP:  Anselmo PicklerAchreja, Manjeet Kaur, MD  Cardiologist:  Garwin Brothersajan R Lylianna Fraiser, MD   Referring MD: Anselmo PicklerAchreja, Manjeet Kaur, *    ASSESSMENT:    1. Coronary artery disease involving native coronary artery of native heart without angina pectoris   2. Diabetes mellitus due to underlying condition with unspecified complications (HCC)   3. S/P CABG x 3   4. Dyslipidemia, goal LDL below 70    PLAN:    In order of problems listed above:  1. Coronary artery disease: Secondary prevention stressed with the patient.  Importance of compliance with diet and medication stressed and he vocalized understanding.  Importance of regular exercise stressed and he walks half an hour daily at least 6 days a week. 2. Essential hypertension: Blood pressure is elevated he is going to keep a track of it twice a day and send it to me in the mail and I will adjust his medications if necessary 3. Mixed dyslipidemia: Diet was discussed lab was discussed with him at length.  Diet was discussed for dyslipidemia and diabetes mellitus 4. Patient will be seen in follow-up appointment in 6 months or earlier if the patient has any concerns    Medication Adjustments/Labs and Tests Ordered: Current medicines are reviewed at length with the patient today.  Concerns regarding medicines are outlined above.  Orders Placed This Encounter  Procedures  . EKG 12-Lead   No orders of the defined types were placed in this encounter.    No chief complaint on file.    History of Present Illness:    Nicholas [name] is a 63 y.o. male.  Patient has past medical history of coronary artery disease, essential hypertension and dyslipidemia.  He is a diabetic also.  He denies any problems at this time and takes care of activities of daily living.  No chest pain orthopnea or PND.  At the time of my evaluation, the patient is alert awake oriented and in no  distress.  Past Medical History:  Diagnosis Date  . Coronary artery disease   . Heart murmur   . High cholesterol   . Hypertension   . NSTEMI (non-ST elevated myocardial infarction) (HCC) 05/14/2018  . PONV (postoperative nausea and vomiting)   . Syncope   . Type II diabetes mellitus (HCC)     Past Surgical History:  Procedure Laterality Date  . CATARACT EXTRACTION W/ INTRAOCULAR LENS  IMPLANT, BILATERAL Bilateral 2012-2017  . CORONARY ARTERY BYPASS GRAFT N/A 03/04/2018   Procedure: CORONARY ARTERY BYPASS GRAFTING (CABG) x 3: -FREE LIMA to LAD, -SVG to DIAGONAL, -SVG to OM; ENDOSCOPIC HARVEST GREATER SAPHENOUS VEIN: -Right Thigh  ;  Surgeon: Kerin PernaVan Trigt, Peter, MD;  [facility];  Service: Open Heart Surgery;  Laterality: N/A;  . CORONARY STENT INTERVENTION N/A 05/20/2018   Procedure: CORONARY STENT INTERVENTION;  Surgeon: Lyn RecordsSmith, Henry W, MD;  [facility];  Service: Cardiovascular;  Laterality: N/A;  . EYE SURGERY    . INTRAVASCULAR PRESSURE WIRE/FFR STUDY N/A 03/01/2018   Procedure: INTRAVASCULAR PRESSURE WIRE/FFR STUDY;  Surgeon: SwazilandJordan, Peter M, MD;  [facility];  Service: Cardiovascular;  Laterality: N/A;  . LEFT HEART CATH AND CORONARY ANGIOGRAPHY N/A 03/01/2018   Procedure: LEFT HEART CATH AND CORONARY ANGIOGRAPHY;  Surgeon: SwazilandJordan, Peter M, MD;  [facility];  Service: Cardiovascular;  Laterality: N/A;  . LEFT  HEART CATH AND CORS/GRAFTS ANGIOGRAPHY N/A 05/16/2018   Procedure: LEFT HEART CATH AND CORS/GRAFTS ANGIOGRAPHY;  Surgeon: Troy Sine, MD;  Location: [place] CV LAB;  Service: Cardiovascular;  Laterality: N/A;  . TEE WITHOUT CARDIOVERSION N/A 03/04/2018   Procedure: TRANSESOPHAGEAL ECHOCARDIOGRAM (TEE);  Surgeon: Prescott Gum, Collier Salina, MD;  Location: [place];  Service: Open Heart Surgery;  Laterality: N/A;    Current Medications: Current Meds  Medication Sig  . aspirin 81 MG tablet Take 1 tablet (81 mg total) by mouth daily.    Marland Kitchen atorvastatin (LIPITOR) 80 MG tablet Take 1 tablet (80 mg total) by mouth daily at 6 PM.  . clopidogrel (PLAVIX) 75 MG tablet TAKE 1 TABLET BY MOUTH EVERY DAY WITH BREAKFAST  . finasteride (PROSCAR) 5 MG tablet Take 5 mg by mouth daily.  . metFORMIN (GLUCOPHAGE) 500 MG tablet Take 500 mg by mouth 2 (two) times daily with a meal.  . metoprolol tartrate (LOPRESSOR) 25 MG tablet Take 0.5 tablets (12.5 mg total) by mouth 2 (two) times daily.  . nitroGLYCERIN (NITROSTAT) 0.4 MG SL tablet Place 1 tablet (0.4 mg total) under the tongue every 5 (five) minutes x 3 doses as needed for chest pain.  . ranolazine (RANEXA) 1000 MG SR tablet Take 1 tablet (1,000 mg total) by mouth 2 (two) times daily.     Allergies:   Eggs or egg-derived products   Social History   Socioeconomic History  . Marital status: Married    Spouse name: Not on file  . Number of children: Not on file  . Years of education: Not on file  . Highest education level: Not on file  Occupational History  . Not on file  Tobacco Use  . Smoking status: Never Smoker  . Smokeless tobacco: Never Used  Substance and Sexual Activity  . Alcohol use: Not Currently    Comment: 05/20/2018 "stopped before 1999"  . Drug use: Never  . Sexual activity: Not on file  Other Topics Concern  . Not on file  Social History Narrative  . Not on file   Social Determinants of Health   Financial Resource Strain:   . Difficulty of Paying Living Expenses: Not on file  Food Insecurity:   . Worried About Charity fundraiser in the Last Year: Not on file  . Ran Out of Food in the Last Year: Not on file  Transportation Needs:   . Lack of Transportation (Medical): Not on file  . Lack of Transportation (Non-Medical): Not on file  Physical Activity:   . Days of Exercise per Week: Not on file  . Minutes of Exercise per Session: Not on file  Stress:   . Feeling of Stress : Not on file  Social Connections:   . Frequency of Communication with Friends  and Family: Not on file  . Frequency of Social Gatherings with Friends and Family: Not on file  . Attends Religious Services: Not on file  . Active Member of Clubs or Organizations: Not on file  . Attends Archivist Meetings: Not on file  . Marital Status: Not on file     Family History: The patient's family history includes Heart disease in his mother.  ROS:   Please see the history of present illness.    All other systems reviewed and are negative.  EKGs/Labs/Other Studies Reviewed:    The following studies were reviewed today:  EKG reveals sinus rhythm and nonspecific ST-T changes. Summary: Right Carotid: Velocities in the right  ICA are consistent with a 40-59%                stenosis. The ECA appears severely stenosed .  Left Carotid: Velocities in the left ICA are consistent with a 40-59% stenosis.  Vertebrals:  Bilateral vertebral arteries demonstrate antegrade flow. Subclavians: Normal flow hemodynamics were seen in bilateral subclavian              arteries.  *See table(s) above for measurements and observations.     Electronically signed by Norman Herrlich MD on 04/10/2019 at 5:51:40 PM.      Recent Labs: 08/08/2019: ALT 16; BUN 14; Creatinine, Ser 0.95; Potassium 4.9; Sodium 139; TSH 1.440  Recent Lipid Panel    Component Value Date/Time   CHOL 113 08/08/2019 0855   TRIG 134 08/08/2019 0855   HDL 40 08/08/2019 0855   CHOLHDL 2.8 08/08/2019 0855   CHOLHDL 6.1 02/28/2018 0346   VLDL 55 (H) 02/28/2018 0346   LDLCALC 49 08/08/2019 0855    Physical Exam:    VS:  BP (!) 158/64   Pulse (!) 57   Ht 5\' 2"  (1.575 m)   Wt 138 lb 12.8 oz (63 kg)   SpO2 95%   BMI 25.39 kg/m     Wt Readings from Last 3 Encounters:  08/18/19 138 lb 12.8 oz (63 kg)  02/21/19 135 lb (61.2 kg)  08/07/18 134 lb (60.8 kg)     GEN: Patient is in no acute distress HEENT: Normal NECK: No JVD; No carotid bruits LYMPHATICS: No lymphadenopathy CARDIAC: Hear sounds  regular, 2/6 systolic murmur at the apex. RESPIRATORY:  Clear to auscultation without rales, wheezing or rhonchi  ABDOMEN: Soft, non-tender, non-distended MUSCULOSKELETAL:  No edema; No deformity  SKIN: Warm and dry NEUROLOGIC:  Alert and oriented x 3 PSYCHIATRIC:  Normal affect   Signed, 14/11/19, MD  08/18/2019 3:41 PM    [facility]

## 2019-08-18 NOTE — Patient Instructions (Signed)

## 2019-08-27 ENCOUNTER — Telehealth: Payer: Self-pay | Admitting: Cardiology

## 2019-09-15 ENCOUNTER — Other Ambulatory Visit: Payer: Self-pay

## 2019-09-15 NOTE — Telephone Encounter (Signed)
Refill request for finesteride routed to PCP.

## 2019-09-23 ENCOUNTER — Other Ambulatory Visit: Payer: Self-pay

## 2019-09-23 NOTE — Telephone Encounter (Signed)
Request for finesteride forward to Dr. Landis Martins.

## 2019-10-06 ENCOUNTER — Telehealth: Payer: Self-pay | Admitting: Cardiology

## 2019-10-06 NOTE — Telephone Encounter (Signed)
New Message  Pt c/o medication issue:  1. Name of Medication: metFORMIN (GLUCOPHAGE) 500 MG tablet  2. How are you currently taking this medication (dosage and times per day)? As written  3. Are you having a reaction (difficulty breathing--STAT)? No   4. What is your medication issue? Out of refills. Needs a new prescription. Send prescription to:  [facility]   [address] [place], [place] [address]

## 2019-10-06 NOTE — Telephone Encounter (Signed)
Patient stated Dr. Tomie China said he would fill medication for him. Informed patient that usually PCP fills metformin. Informed patient that message would be sent to Dr. Tomie China.

## 2019-10-06 NOTE — Telephone Encounter (Signed)
pcp

## 2019-12-29 ENCOUNTER — Encounter: Payer: Self-pay | Admitting: Cardiology

## 2019-12-29 ENCOUNTER — Ambulatory Visit: Payer: Medicare Other | Admitting: Cardiology

## 2019-12-29 ENCOUNTER — Other Ambulatory Visit: Payer: Self-pay

## 2019-12-29 VITALS — BP 190/88 | HR 58 | Ht 62.0 in | Wt 138.0 lb

## 2019-12-29 DIAGNOSIS — I2510 Atherosclerotic heart disease of native coronary artery without angina pectoris: Secondary | ICD-10-CM

## 2019-12-29 DIAGNOSIS — E785 Hyperlipidemia, unspecified: Secondary | ICD-10-CM | POA: Diagnosis not present

## 2019-12-29 DIAGNOSIS — I10 Essential (primary) hypertension: Secondary | ICD-10-CM | POA: Diagnosis not present

## 2019-12-29 DIAGNOSIS — E088 Diabetes mellitus due to underlying condition with unspecified complications: Secondary | ICD-10-CM

## 2019-12-29 DIAGNOSIS — Z951 Presence of aortocoronary bypass graft: Secondary | ICD-10-CM | POA: Diagnosis not present

## 2019-12-29 NOTE — Patient Instructions (Addendum)
Medication Instructions:  No medication changes. *If you need a refill on your cardiac medications before your next appointment, please call your pharmacy*   Lab Work: None ordered If you have labs (blood work) drawn today and your tests are completely normal, you will receive your results only by: Marland Kitchen MyChart Message (if you have MyChart) OR . A paper copy in the mail If you have any lab test that is abnormal or we need to change your treatment, we will call you to review the results.   Testing/Procedures: None ordered   Follow-Up: At [facility], you and your health needs are our priority.  As part of our continuing mission to provide you with exceptional heart care, we have created designated Provider Care Teams.  These Care Teams include your primary Cardiologist (physician) and Advanced Practice Providers (APPs -  Physician Assistants and Nurse Practitioners) who all work together to provide you with the care you need, when you need it.  We recommend signing up for the patient portal called "MyChart".  Sign up information is provided on this After Visit Summary.  MyChart is used to connect with patients for Virtual Visits (Telemedicine).  Patients are able to view lab/test results, encounter notes, upcoming appointments, etc.  Non-urgent messages can be sent to your provider as well.   To learn more about what you can do with MyChart, go to [email or web address].    Your next appointment:   6 month(s)  The format for your next appointment:   In Person  Provider:   Belva Crome, MD   Other Instructions  Please record your blood pressures and send them to our [place] office.  Dr. Tomie China [address] [place], [place] [address]

## 2019-12-29 NOTE — Progress Notes (Signed)
Cardiology Office Note:    Date:  12/29/2019   ID:  Nicholas [name], DOB 10-Sep-1955, MRN [id number]  PCP:  Anselmo Pickler, MD  Cardiologist:  Garwin Brothers, MD   Referring MD: Anselmo Pickler, *    ASSESSMENT:    1. Coronary artery disease involving native coronary artery of native heart without angina pectoris   2. Dyslipidemia, goal LDL below 70   3. Essential hypertension   4. S/P CABG x 3   5. Diabetes mellitus due to underlying condition with unspecified complications (HCC)    PLAN:    In order of problems listed above:  1. Coronary artery disease: Secondary prevention stressed with the patient.  Importance of compliance with diet and medication stressed and he vocalized understanding.  I urged him to walk at least half an hour a day 5 days a week and he promises to do so. 2. Essential hypertension: Blood pressure is stable.  He will write down his blood pressure twice a day at home and put it in the mail for me to review.  He tells me his systolics are always less than 140 and his blood pressure numbers appear to be acceptable to me. 3. Mixed dyslipidemia: Diet was emphasized he recently had blood work by his primary care physician and we will review this. 4. Diabetes mellitus: Diet was urged and this is managed by his primary care physician. 5. Patient will be seen in follow-up appointment in 6 months or earlier if the patient has any concerns    Medication Adjustments/Labs and Tests Ordered: Current medicines are reviewed at length with the patient today.  Concerns regarding medicines are outlined above.  No orders of the defined types were placed in this encounter.  No orders of the defined types were placed in this encounter.    Chief Complaint  Patient presents with  . Follow-up    6 Months     History of Present Illness:    Nicholas [name] is a 64 y.o. male.  Patient has past medical history of coronary artery disease, essential hypertension and  dyslipidemia.  He denies any problems at this time and takes care of activities of daily living.  No chest pain orthopnea or PND.  His blood pressure is elevated today and he mentions to me that it is generally fine at home.  He has an element of whitecoat hypertension.  At the time of my evaluation, the patient is alert awake oriented and in no distress.  Past Medical History:  Diagnosis Date  . Coronary artery disease   . Heart murmur   . High cholesterol   . Hypertension   . NSTEMI (non-ST elevated myocardial infarction) (HCC) 05/14/2018  . PONV (postoperative nausea and vomiting)   . Syncope   . Type II diabetes mellitus (HCC)     Past Surgical History:  Procedure Laterality Date  . CATARACT EXTRACTION W/ INTRAOCULAR LENS  IMPLANT, BILATERAL Bilateral 2012-2017  . CORONARY ARTERY BYPASS GRAFT N/A 03/04/2018   Procedure: CORONARY ARTERY BYPASS GRAFTING (CABG) x 3: -FREE LIMA to LAD, -SVG to DIAGONAL, -SVG to OM; ENDOSCOPIC HARVEST GREATER SAPHENOUS VEIN: -Right Thigh  ;  Surgeon: Kerin Perna, MD;  Location: [facility];  Service: Open Heart Surgery;  Laterality: N/A;  . CORONARY STENT INTERVENTION N/A 05/20/2018   Procedure: CORONARY STENT INTERVENTION;  Surgeon: Lyn Records, MD;  [facility]: [facility];  Service: Cardiovascular;  Laterality: N/A;  . EYE SURGERY    .  INTRAVASCULAR PRESSURE WIRE/FFR STUDY N/A 03/01/2018   Procedure: INTRAVASCULAR PRESSURE WIRE/FFR STUDY;  Surgeon: Martinique, Peter M, MD;  Location: [place] [facility];  Service: Cardiovascular;  Laterality: N/A;  . LEFT HEART CATH AND CORONARY ANGIOGRAPHY N/A 03/01/2018   Procedure: LEFT HEART CATH AND CORONARY ANGIOGRAPHY;  Surgeon: Martinique, Peter M, MD;  Location: [place] [facility] LAB;  Service: Cardiovascular;  Laterality: N/A;  . LEFT HEART CATH AND CORS/GRAFTS ANGIOGRAPHY N/A 05/16/2018   Procedure: LEFT HEART CATH AND CORS/GRAFTS ANGIOGRAPHY;  Surgeon: Troy Sine, MD;  Location: [place] [facility];  Service:  Cardiovascular;  Laterality: N/A;  . TEE WITHOUT CARDIOVERSION N/A 03/04/2018   Procedure: TRANSESOPHAGEAL ECHOCARDIOGRAM (TEE);  Surgeon: Prescott Gum, Collier Salina, MD;  Location: [place];  Service: Open Heart Surgery;  Laterality: N/A;    Current Medications: Current Meds  Medication Sig  . aspirin 81 MG tablet Take 1 tablet (81 mg total) by mouth daily.  Marland Kitchen atorvastatin (LIPITOR) 80 MG tablet Take 1 tablet (80 mg total) by mouth daily at 6 PM.  . clopidogrel (PLAVIX) 75 MG tablet TAKE 1 TABLET BY MOUTH EVERY DAY WITH BREAKFAST  . finasteride (PROSCAR) 5 MG tablet Take 5 mg by mouth daily.  . metFORMIN (GLUCOPHAGE) 500 MG tablet Take 500 mg by mouth 2 (two) times daily with a meal.  . metoprolol tartrate (LOPRESSOR) 25 MG tablet Take 0.5 tablets (12.5 mg total) by mouth 2 (two) times daily.  . nitroGLYCERIN (NITROSTAT) 0.4 MG SL tablet Place 1 tablet (0.4 mg total) under the tongue every 5 (five) minutes x 3 doses as needed for chest pain.  . ranolazine (RANEXA) 1000 MG SR tablet Take 1 tablet (1,000 mg total) by mouth 2 (two) times daily.  . tamsulosin (FLOMAX) 0.4 MG CAPS capsule Take 0.4 mg by mouth daily.     Allergies:   Eggs or egg-derived products   Social History   Socioeconomic History  . Marital status: Married    Spouse name: Not on file  . Number of children: Not on file  . Years of education: Not on file  . Highest education level: Not on file  Occupational History  . Not on file  Tobacco Use  . Smoking status: Never Smoker  . Smokeless tobacco: Never Used  Substance and Sexual Activity  . Alcohol use: Not Currently    Comment: 05/20/2018 "stopped before 1999"  . Drug use: Never  . Sexual activity: Not on file  Other Topics Concern  . Not on file  Social History Narrative  . Not on file   Social Determinants of Health   Financial Resource Strain:   . Difficulty of Paying Living Expenses:   Food Insecurity:   . Worried About Charity fundraiser in the Last Year:   .  Arboriculturist in the Last Year:   Transportation Needs:   . Film/video editor (Medical):   Marland Kitchen Lack of Transportation (Non-Medical):   Physical Activity:   . Days of Exercise per Week:   . Minutes of Exercise per Session:   Stress:   . Feeling of Stress :   Social Connections:   . Frequency of Communication with Friends and Family:   . Frequency of Social Gatherings with Friends and Family:   . Attends Religious Services:   . Active Member of Clubs or Organizations:   . Attends Archivist Meetings:   Marland Kitchen Marital Status:      Family History: The patient's family history  includes Heart disease in his mother.  ROS:   Please see the history of present illness.    All other systems reviewed and are negative.  EKGs/Labs/Other Studies Reviewed:    The following studies were reviewed today: I discussed my findings with the patient at length.   Recent Labs: 08/08/2019: ALT 16; BUN 14; Creatinine, Ser 0.95; Potassium 4.9; Sodium 139; TSH 1.440  Recent Lipid Panel    Component Value Date/Time   CHOL 113 08/08/2019 0855   TRIG 134 08/08/2019 0855   HDL 40 08/08/2019 0855   CHOLHDL 2.8 08/08/2019 0855   CHOLHDL 6.1 02/28/2018 0346   VLDL 55 (H) 02/28/2018 0346   LDLCALC 49 08/08/2019 0855    Physical Exam:    VS:  BP (!) 190/88   Pulse (!) 58   Ht 5\' 2"  (1.575 m)   Wt 138 lb (62.6 kg)   SpO2 100%   BMI 25.24 kg/m     Wt Readings from Last 3 Encounters:  12/29/19 138 lb (62.6 kg)  08/18/19 138 lb 12.8 oz (63 kg)  02/21/19 135 lb (61.2 kg)     GEN: Patient is in no acute distress HEENT: Normal NECK: No JVD; No carotid bruits LYMPHATICS: No lymphadenopathy CARDIAC: Hear sounds regular, 2/6 systolic murmur at the apex. RESPIRATORY:  Clear to auscultation without rales, wheezing or rhonchi  ABDOMEN: Soft, non-tender, non-distended MUSCULOSKELETAL:  No edema; No deformity  SKIN: Warm and dry NEUROLOGIC:  Alert and oriented x 3 PSYCHIATRIC:  Normal  affect   Signed, 02/23/19, MD  12/29/2019 9:36 AM    [facility]

## 2020-03-03 ENCOUNTER — Other Ambulatory Visit: Payer: Self-pay | Admitting: Cardiology

## 2020-03-05 ENCOUNTER — Other Ambulatory Visit: Payer: Self-pay

## 2020-03-30 ENCOUNTER — Other Ambulatory Visit: Payer: Self-pay | Admitting: Cardiology

## 2020-06-07 ENCOUNTER — Other Ambulatory Visit: Payer: Self-pay | Admitting: Cardiology

## 2020-06-09 DIAGNOSIS — R112 Nausea with vomiting, unspecified: Secondary | ICD-10-CM | POA: Insufficient documentation

## 2020-06-09 DIAGNOSIS — Z9889 Other specified postprocedural states: Secondary | ICD-10-CM | POA: Insufficient documentation

## 2020-06-09 DIAGNOSIS — I10 Essential (primary) hypertension: Secondary | ICD-10-CM | POA: Insufficient documentation

## 2020-06-09 DIAGNOSIS — E7800 Pure hypercholesterolemia, unspecified: Secondary | ICD-10-CM | POA: Insufficient documentation

## 2020-06-09 DIAGNOSIS — E119 Type 2 diabetes mellitus without complications: Secondary | ICD-10-CM | POA: Insufficient documentation

## 2020-06-09 DIAGNOSIS — I2510 Atherosclerotic heart disease of native coronary artery without angina pectoris: Secondary | ICD-10-CM | POA: Insufficient documentation

## 2020-06-09 DIAGNOSIS — R011 Cardiac murmur, unspecified: Secondary | ICD-10-CM | POA: Insufficient documentation

## 2020-06-11 ENCOUNTER — Other Ambulatory Visit: Payer: Self-pay

## 2020-06-11 ENCOUNTER — Encounter: Payer: Self-pay | Admitting: Cardiology

## 2020-06-11 ENCOUNTER — Ambulatory Visit: Payer: Medicare Other | Admitting: Cardiology

## 2020-06-11 VITALS — BP 154/66 | HR 62 | Ht 63.0 in | Wt 136.0 lb

## 2020-06-11 DIAGNOSIS — E785 Hyperlipidemia, unspecified: Secondary | ICD-10-CM

## 2020-06-11 DIAGNOSIS — E088 Diabetes mellitus due to underlying condition with unspecified complications: Secondary | ICD-10-CM | POA: Diagnosis not present

## 2020-06-11 DIAGNOSIS — I10 Essential (primary) hypertension: Secondary | ICD-10-CM

## 2020-06-11 DIAGNOSIS — Z951 Presence of aortocoronary bypass graft: Secondary | ICD-10-CM

## 2020-06-11 DIAGNOSIS — I2510 Atherosclerotic heart disease of native coronary artery without angina pectoris: Secondary | ICD-10-CM

## 2020-06-11 NOTE — Progress Notes (Signed)
Cardiology Office Note:    Date:  06/11/2020   ID:  Nicholas [name], DOB 1955/09/11, MRN [id number]  PCP:  Anselmo Pickler, MD  Cardiologist:  Garwin Brothers, MD   Referring MD: Anselmo Pickler, *    ASSESSMENT:    1. Coronary artery disease involving native coronary artery of native heart without angina pectoris   2. Dyslipidemia, goal LDL below 70   3. Hypertension, unspecified type   4. Diabetes mellitus due to underlying condition with unspecified complications (HCC)   5. S/P CABG x 3    PLAN:    In order of problems listed above:  1. Coronary artery disease: Secondary prevention stressed with the patient.  Importance of compliance with diet medication stressed any vocalized understanding.  He exercises at least half an hour a day on a regular basis 5 days a week and advised him to continue doing this. 2. Essential hypertension: Blood pressure stable and he mentioned blood pressure readings at home and they are fine. 3. Mixed dyslipidemia: Patient on statin therapy.  He will be back in the next few days for blood work.  Diet and exercise advised 4. Diabetes mellitus: Managed by primary care physician and under good control 5. Patient will be seen in follow-up appointment in 6 months or earlier if the patient has any concerns    Medication Adjustments/Labs and Tests Ordered: Current medicines are reviewed at length with the patient today.  Concerns regarding medicines are outlined above.  No orders of the defined types were placed in this encounter.  No orders of the defined types were placed in this encounter.    No chief complaint on file.    History of Present Illness:    Nicholas [name] is a 64 y.o. male.  Patient has past medical history of coronary artery disease post coronary intervention which was not successful and patient underwent bypass surgery, history of essential hypertension and dyslipidemia and diabetes mellitus.  He denies any problems at this  time and takes care of activities of daily living.  He is very active and walks half an hour on a daily basis.  No chest pain orthopnea or PND.  At the time of my evaluation, the patient is alert awake oriented and in no distress.  Past Medical History:  Diagnosis Date  . CAD (coronary artery disease) 07/15/2018  . CAD S/P percutaneous coronary angioplasty 05/29/2018   S/P native CTO CFX and 70% LAD PCI with DES 05/16/18 after early occlusion of his grafts  . Carotid stenosis 02/21/2019  . Chronic systolic (congestive) heart failure (HCC) 07/15/2018  . Coronary artery disease   . Diabetes mellitus due to underlying condition with unspecified complications (HCC) 05/31/2018  . Dyslipidemia, goal LDL below 70 03/27/2018   Hyperlipidemia  . Heart murmur   . High cholesterol   . HTN (hypertension) 07/15/2018  . Hypertension   . Non-insulin dependent type 2 diabetes mellitus (HCC) 03/27/2018  . NSTEMI (non-ST elevated myocardial infarction) (HCC) 05/14/2018  . PONV (postoperative nausea and vomiting)   . Pulmonary nodules 07/15/2018  . 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 Normal LVF pre op    . Stricture of artery (HCC) 02/02/2012  . Subclavian arterial stenosis (HCC) 01/19/2012   Left subclavian stenosis 75% with pressure gradient of 60 mm Hg at cath  . Syncope   .  Type II diabetes mellitus (HCC)     Past Surgical History:  Procedure Laterality Date  . CATARACT EXTRACTION W/ INTRAOCULAR LENS  IMPLANT, BILATERAL Bilateral 2012-2017  . CORONARY ARTERY BYPASS GRAFT N/A 03/04/2018   Procedure: CORONARY ARTERY BYPASS GRAFTING (CABG) x 3: -FREE LIMA to LAD, -SVG to DIAGONAL, -SVG to OM; ENDOSCOPIC HARVEST GREATER SAPHENOUS VEIN: -Right Thigh  ;  Surgeon: Kerin Perna, MD;  Location: [facility] OR;  Service: Open Heart Surgery;   Laterality: N/A;  . CORONARY STENT INTERVENTION N/A 05/20/2018   Procedure: CORONARY STENT INTERVENTION;  Surgeon: Lyn Records, MD;  Location: [facility];  Service: Cardiovascular;  Laterality: N/A;  . EYE SURGERY    . INTRAVASCULAR PRESSURE WIRE/FFR STUDY N/A 03/01/2018   Procedure: INTRAVASCULAR PRESSURE WIRE/FFR STUDY;  Surgeon: Swaziland, Peter M, MD;  [facility];  Service: Cardiovascular;  Laterality: N/A;  . LEFT HEART CATH AND CORONARY ANGIOGRAPHY N/A 03/01/2018   Procedure: LEFT HEART CATH AND CORONARY ANGIOGRAPHY;  Surgeon: Swaziland, Peter M, MD;  Location: [facility] INVASIVE CV LAB;  Service: Cardiovascular;  Laterality: N/A;  . LEFT HEART CATH AND CORS/GRAFTS ANGIOGRAPHY N/A 05/16/2018   Procedure: LEFT HEART CATH AND CORS/GRAFTS ANGIOGRAPHY;  Surgeon: Lennette Bihari, MD;  Location: [facility];  Service: Cardiovascular;  Laterality: N/A;  . TEE WITHOUT CARDIOVERSION N/A 03/04/2018   Procedure: TRANSESOPHAGEAL ECHOCARDIOGRAM (TEE);  Surgeon: Donata Clay, Theron Arista, MD;  Location: [facility] OR;  Service: Open Heart Surgery;  Laterality: N/A;    Current Medications: Current Meds  Medication Sig  . aspirin 81 MG tablet Take 1 tablet (81 mg total) by mouth daily.  Marland Kitchen atorvastatin (LIPITOR) 80 MG tablet Take 1 tablet (80 mg total) by mouth daily at 6 PM.  . clopidogrel (PLAVIX) 75 MG tablet TAKE ONE TABLET BY MOUTH DAILY WITH BREAKFAST  . finasteride (PROSCAR) 5 MG tablet Take 5 mg by mouth daily.  . metFORMIN (GLUCOPHAGE) 500 MG tablet Take 500 mg by mouth 2 (two) times daily with a meal.  . metoprolol tartrate (LOPRESSOR) 25 MG tablet TAKE 1/2 TABLET BY MOUTH TWO TIMES A DAY  . nitroGLYCERIN (NITROSTAT) 0.4 MG SL tablet Place 1 tablet (0.4 mg total) under the tongue every 5 (five) minutes x 3 doses as needed for chest pain.  . ranolazine (RANEXA) 1000 MG SR tablet TAKE ONE TABLET BY MOUTH TWICE A DAY  . tamsulosin (FLOMAX) 0.4 MG CAPS capsule Take 0.4 mg by mouth daily.      Allergies:   Eggs or egg-derived products   Social History   Socioeconomic History  . Marital status: Married    Spouse name: Not on file  . Number of children: Not on file  . Years of education: Not on file  . Highest education level: Not on file  Occupational History  . Not on file  Tobacco Use  . Smoking status: Never Smoker  . Smokeless tobacco: Never Used  Vaping Use  . Vaping Use: Never used  Substance and Sexual Activity  . Alcohol use: Not Currently    Comment: 05/20/2018 "stopped before 1999"  . Drug use: Never  . Sexual activity: Not on file  Other Topics Concern  . Not on file  Social History Narrative  . Not on file   Social Determinants of Health   Financial Resource Strain:   . Difficulty of Paying Living Expenses: Not on file  Food Insecurity:   . Worried About Programme researcher, broadcasting/film/video in the  Last Year: Not on file  . Ran Out of Food in the Last Year: Not on file  Transportation Needs:   . Lack of Transportation (Medical): Not on file  . Lack of Transportation (Non-Medical): Not on file  Physical Activity:   . Days of Exercise per Week: Not on file  . Minutes of Exercise per Session: Not on file  Stress:   . Feeling of Stress : Not on file  Social Connections:   . Frequency of Communication with Friends and Family: Not on file  . Frequency of Social Gatherings with Friends and Family: Not on file  . Attends Religious Services: Not on file  . Active Member of Clubs or Organizations: Not on file  . Attends Banker Meetings: Not on file  . Marital Status: Not on file     Family History: The patient's family history includes Heart disease in his mother.  ROS:   Please see the history of present illness.    All other systems reviewed and are negative.  EKGs/Labs/Other Studies Reviewed:    The following studies were reviewed today: I discussed my findings with the patient at length   Recent Labs: 08/08/2019: ALT 16; BUN 14;  Creatinine, Ser 0.95; Potassium 4.9; Sodium 139; TSH 1.440  Recent Lipid Panel    Component Value Date/Time   CHOL 113 08/08/2019 0855   TRIG 134 08/08/2019 0855   HDL 40 08/08/2019 0855   CHOLHDL 2.8 08/08/2019 0855   CHOLHDL 6.1 02/28/2018 0346   VLDL 55 (H) 02/28/2018 0346   LDLCALC 49 08/08/2019 0855    Physical Exam:    VS:  BP (!) 154/66   Pulse 62   Ht 5\' 3"  (1.6 m)   Wt 136 lb 0.6 oz (61.7 kg)   SpO2 97%   BMI 24.10 kg/m     Wt Readings from Last 3 Encounters:  06/11/20 136 lb 0.6 oz (61.7 kg)  12/29/19 138 lb (62.6 kg)  08/18/19 138 lb 12.8 oz (63 kg)     GEN: Patient is in no acute distress HEENT: Normal NECK: No JVD; No carotid bruits LYMPHATICS: No lymphadenopathy CARDIAC: Hear sounds regular, 2/6 systolic murmur at the apex. RESPIRATORY:  Clear to auscultation without rales, wheezing or rhonchi  ABDOMEN: Soft, non-tender, non-distended MUSCULOSKELETAL:  No edema; No deformity  SKIN: Warm and dry NEUROLOGIC:  Alert and oriented x 3 PSYCHIATRIC:  Normal affect   Signed, 08/20/19, MD  06/11/2020 1:43 PM    [facility]

## 2020-06-11 NOTE — Patient Instructions (Signed)

## 2020-06-26 LAB — CBC WITH DIFFERENTIAL/PLATELET
Basophils Absolute: 0.1 10*3/uL (ref 0.0–0.2)
Basos: 1 %
EOS (ABSOLUTE): 0.2 10*3/uL (ref 0.0–0.4)
Eos: 2 %
Hematocrit: 43.6 % (ref 37.5–51.0)
Hemoglobin: 14.7 g/dL (ref 13.0–17.7)
Immature Grans (Abs): 0 10*3/uL (ref 0.0–0.1)
Immature Granulocytes: 0 %
Lymphocytes Absolute: 2.8 10*3/uL (ref 0.7–3.1)
Lymphs: 38 %
MCH: 30.1 pg (ref 26.6–33.0)
MCHC: 33.7 g/dL (ref 31.5–35.7)
MCV: 89 fL (ref 79–97)
Monocytes Absolute: 0.5 10*3/uL (ref 0.1–0.9)
Monocytes: 7 %
Neutrophils Absolute: 3.8 10*3/uL (ref 1.4–7.0)
Neutrophils: 52 %
Platelets: 216 10*3/uL (ref 150–450)
RBC: 4.89 x10E6/uL (ref 4.14–5.80)
RDW: 12.1 % (ref 11.6–15.4)
WBC: 7.4 10*3/uL (ref 3.4–10.8)

## 2020-06-26 LAB — BASIC METABOLIC PANEL
BUN/Creatinine Ratio: 15 (ref 10–24)
BUN: 15 mg/dL (ref 8–27)
CO2: 23 mmol/L (ref 20–29)
Calcium: 9.4 mg/dL (ref 8.6–10.2)
Chloride: 101 mmol/L (ref 96–106)
Creatinine, Ser: 0.98 mg/dL (ref 0.76–1.27)
GFR calc Af Amer: 92 mL/min/{1.73_m2} (ref >59)
GFR calc non Af Amer: 80 mL/min/{1.73_m2} (ref >59)
Glucose: 151 mg/dL — ABNORMAL HIGH (ref 65–99)
Potassium: 4.7 mmol/L (ref 3.5–5.2)
Sodium: 136 mmol/L (ref 134–144)

## 2020-06-26 LAB — LIPID PANEL
Chol/HDL Ratio: 3 ratio (ref 0.0–5.0)
Cholesterol, Total: 125 mg/dL (ref 100–199)
HDL: 42 mg/dL (ref >39)
LDL Chol Calc (NIH): 61 mg/dL (ref 0–99)
Triglycerides: 124 mg/dL (ref 0–149)
VLDL Cholesterol Cal: 22 mg/dL (ref 5–40)

## 2020-06-26 LAB — HEPATIC FUNCTION PANEL
ALT: 16 IU/L (ref 0–44)
AST: 14 IU/L (ref 0–40)
Albumin: 4.7 g/dL (ref 3.8–4.8)
Alkaline Phosphatase: 72 IU/L (ref 44–121)
Bilirubin Total: 0.5 mg/dL (ref 0.0–1.2)
Bilirubin, Direct: 0.14 mg/dL (ref 0.00–0.40)
Total Protein: 6.9 g/dL (ref 6.0–8.5)

## 2020-06-26 LAB — TSH: TSH: 1.96 u[IU]/mL (ref 0.450–4.500)

## 2020-09-09 ENCOUNTER — Other Ambulatory Visit: Payer: Self-pay | Admitting: Cardiology

## 2020-09-09 DIAGNOSIS — E785 Hyperlipidemia, unspecified: Secondary | ICD-10-CM

## 2020-11-08 ENCOUNTER — Encounter: Payer: Self-pay | Admitting: Cardiology

## 2020-11-08 ENCOUNTER — Other Ambulatory Visit: Payer: Self-pay

## 2020-11-08 ENCOUNTER — Ambulatory Visit: Payer: Medicare Other | Admitting: Cardiology

## 2020-11-08 VITALS — BP 146/62 | HR 49 | Ht 62.0 in | Wt 145.0 lb

## 2020-11-08 DIAGNOSIS — Z951 Presence of aortocoronary bypass graft: Secondary | ICD-10-CM

## 2020-11-08 DIAGNOSIS — E785 Hyperlipidemia, unspecified: Secondary | ICD-10-CM

## 2020-11-08 DIAGNOSIS — Z1329 Encounter for screening for other suspected endocrine disorder: Secondary | ICD-10-CM

## 2020-11-08 DIAGNOSIS — E088 Diabetes mellitus due to underlying condition with unspecified complications: Secondary | ICD-10-CM | POA: Diagnosis not present

## 2020-11-08 DIAGNOSIS — E119 Type 2 diabetes mellitus without complications: Secondary | ICD-10-CM

## 2020-11-08 DIAGNOSIS — I2510 Atherosclerotic heart disease of native coronary artery without angina pectoris: Secondary | ICD-10-CM

## 2020-11-08 DIAGNOSIS — I10 Essential (primary) hypertension: Secondary | ICD-10-CM

## 2020-11-08 NOTE — Patient Instructions (Signed)
Medication Instructions:  Your physician has recommended you make the following change in your medication:   Stop aspirin.  *If you need a refill on your cardiac medications before your next appointment, please call your pharmacy*   Lab Work: Your physician recommends that you have labs done in the office today. Your test included  basic metabolic panel, complete blood count, vitamin D, vitamins B12, HgB A1C,TSH, liver function and lipids.  If you have labs (blood work) drawn today and your tests are completely normal, you will receive your results only by: Marland Kitchen MyChart Message (if you have MyChart) OR . A paper copy in the mail If you have any lab test that is abnormal or we need to change your treatment, we will call you to review the results.   Testing/Procedures: None ordered   Follow-Up: At [facility], you and your health needs are our priority.  As part of our continuing mission to provide you with exceptional heart care, we have created designated Provider Care Teams.  These Care Teams include your primary Cardiologist (physician) and Advanced Practice Providers (APPs -  Physician Assistants and Nurse Practitioners) who all work together to provide you with the care you need, when you need it.  We recommend signing up for the patient portal called "MyChart".  Sign up information is provided on this After Visit Summary.  MyChart is used to connect with patients for Virtual Visits (Telemedicine).  Patients are able to view lab/test results, encounter notes, upcoming appointments, etc.  Non-urgent messages can be sent to your provider as well.   To learn more about what you can do with MyChart, go to [email or web address].    Your next appointment:   6 month(s)  The format for your next appointment:   In Person  Provider:   Belva Crome, MD   Other Instructions NA

## 2020-11-08 NOTE — Progress Notes (Signed)
Cardiology Office Note:    Date:  11/08/2020   ID:  Nicholas [name], DOB Sep 11, 1955, MRN [id number]  PCP:  Anselmo Pickler, MD  Cardiologist:  Garwin Brothers, MD   Referring MD: Anselmo Pickler, *    ASSESSMENT:    1. Coronary artery disease involving native coronary artery of native heart without angina pectoris   2. Dyslipidemia, goal LDL below 70   3. Primary hypertension   4. Diabetes mellitus due to underlying condition with unspecified complications (HCC)   5. S/P CABG x 3    PLAN:    In order of problems listed above:  1. Coronary artery disease: Secondary prevention stressed with the patient.  Importance of compliance with diet medication stressed any vocalized understanding.  He has excellent effort tolerance with good exercise protocol and I urged him to continue this.  At the time of my evaluation, the patient is alert awake oriented and in no distress. 2. Essential hypertension: Blood pressure stable and diet was emphasized.  His blood pressures at home are fine and he mentioned to me the readings. 3. Mixed dyslipidemia: Diet was emphasized.  He is fasting and will have blood work today.  Previous lab work from [facility] was reviewed. 4. Diabetes mellitus: I discussed diet and exercise with him.  We will do hemoglobin A1c today.  This is managed by his primary care physician. 5. Patient will be seen in follow-up appointment in 6 months or earlier if the patient has any concerns    Medication Adjustments/Labs and Tests Ordered: Current medicines are reviewed at length with the patient today.  Concerns regarding medicines are outlined above.  No orders of the defined types were placed in this encounter.  No orders of the defined types were placed in this encounter.    No chief complaint on file.    History of Present Illness:    Nicholas [name] is a 65 y.o. male.  Patient has past medical history of coronary artery disease post coronary intervention and  bypass surgery.  He has history of essential hypertension and dyslipidemia.  He denies any problems at this time and takes care of activities of daily living.  No chest pain orthopnea or PND.  He walks at least half an hour on a daily basis without any symptoms.  At the time of my evaluation, the patient is alert awake oriented and in no distress.  He is on dual antiplatelet therapy.  Past Medical History:  Diagnosis Date  . CAD (coronary artery disease) 07/15/2018  . CAD S/P percutaneous coronary angioplasty 05/29/2018   S/P native CTO CFX and 70% LAD PCI with DES 05/16/18 after early occlusion of his grafts  . Carotid stenosis 02/21/2019  . Chronic systolic (congestive) heart failure (HCC) 07/15/2018  . Coronary artery disease   . Diabetes mellitus due to underlying condition with unspecified complications (HCC) 05/31/2018  . Dyslipidemia, goal LDL below 70 03/27/2018   Hyperlipidemia  . Heart murmur   . High cholesterol   . HTN (hypertension) 07/15/2018  . Hypertension   . Non-insulin dependent type 2 diabetes mellitus (HCC) 03/27/2018  . NSTEMI (non-ST elevated myocardial infarction) (HCC) 05/14/2018  . PONV (postoperative nausea and vomiting)   . Pulmonary nodules 07/15/2018  . 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 Normal LVF pre op    .  Stricture of artery (HCC) 02/02/2012  . Subclavian arterial stenosis (HCC) 01/19/2012   Left subclavian stenosis 75% with pressure gradient of 60 mm Hg at cath  . Syncope   . Type II diabetes mellitus (HCC)     Past Surgical History:  Procedure Laterality Date  . CATARACT EXTRACTION W/ INTRAOCULAR LENS  IMPLANT, BILATERAL Bilateral 2012-2017  . CORONARY ARTERY BYPASS GRAFT N/A 03/04/2018   Procedure: CORONARY ARTERY BYPASS GRAFTING (CABG) x 3: -FREE LIMA to LAD, -SVG to DIAGONAL, -SVG  to OM; ENDOSCOPIC HARVEST GREATER SAPHENOUS VEIN: -Right Thigh  ;  Surgeon: Kerin Perna, MD;  Location: [facility] OR;  Service: Open Heart Surgery;  Laterality: N/A;  . CORONARY STENT INTERVENTION N/A 05/20/2018   Procedure: CORONARY STENT INTERVENTION;  Surgeon: Lyn Records, MD;  Location: [facility] INVASIVE CV LAB;  Service: Cardiovascular;  Laterality: N/A;  . EYE SURGERY    . INTRAVASCULAR PRESSURE WIRE/FFR STUDY N/A 03/01/2018   Procedure: INTRAVASCULAR PRESSURE WIRE/FFR STUDY;  Surgeon: Swaziland, Peter M, MD;  Location: [facility] INVASIVE CV LAB;  Service: Cardiovascular;  Laterality: N/A;  . LEFT HEART CATH AND CORONARY ANGIOGRAPHY N/A 03/01/2018   Procedure: LEFT HEART CATH AND CORONARY ANGIOGRAPHY;  Surgeon: Swaziland, Peter M, MD;  Location: [facility] INVASIVE CV LAB;  Service: Cardiovascular;  Laterality: N/A;  . LEFT HEART CATH AND CORS/GRAFTS ANGIOGRAPHY N/A 05/16/2018   Procedure: LEFT HEART CATH AND CORS/GRAFTS ANGIOGRAPHY;  Surgeon: Lennette Bihari, MD;  Location: [facility] INVASIVE CV LAB;  Service: Cardiovascular;  Laterality: N/A;  . TEE WITHOUT CARDIOVERSION N/A 03/04/2018   Procedure: TRANSESOPHAGEAL ECHOCARDIOGRAM (TEE);  Surgeon: Donata Clay, Theron Arista, MD;  Location: [facility] OR;  Service: Open Heart Surgery;  Laterality: N/A;    Current Medications: Current Meds  Medication Sig  . aspirin 81 MG tablet Take 1 tablet (81 mg total) by mouth daily.  Marland Kitchen atorvastatin (LIPITOR) 80 MG tablet Take 80 mg by mouth daily.  . clopidogrel (PLAVIX) 75 MG tablet Take 75 mg by mouth daily.  . finasteride (PROSCAR) 5 MG tablet Take 5 mg by mouth daily.  . metFORMIN (GLUCOPHAGE) 500 MG tablet Take 500 mg by mouth 2 (two) times daily with a meal.  . metoprolol tartrate (LOPRESSOR) 25 MG tablet Take 12.5 mg by mouth 2 (two) times daily.  . nitroGLYCERIN (NITROSTAT) 0.4 MG SL tablet Place 1 tablet (0.4 mg total) under the tongue every 5 (five) minutes x 3 doses as needed for chest pain.  . ranolazine (RANEXA) 1000 MG SR tablet Take 1,000 mg  by mouth 2 (two) times daily.  . tamsulosin (FLOMAX) 0.4 MG CAPS capsule Take 0.4 mg by mouth daily.     Allergies:   Eggs or egg-derived products   Social History   Socioeconomic History  . Marital status: Married    Spouse name: Not on file  . Number of children: Not on file  . Years of education: Not on file  . Highest education level: Not on file  Occupational History  . Not on file  Tobacco Use  . Smoking status: Never Smoker  . Smokeless tobacco: Never Used  Vaping Use  . Vaping Use: Never used  Substance and Sexual Activity  . Alcohol use: Not Currently    Comment: 05/20/2018 "stopped before 1999"  . Drug use: Never  . Sexual activity: Not on file  Other Topics Concern  . Not on file  Social History Narrative  . Not on file   Social Determinants of Health   Financial Resource  Strain: Not on file  Food Insecurity: Not on file  Transportation Needs: Not on file  Physical Activity: Not on file  Stress: Not on file  Social Connections: Not on file     Family History: The patient's family history includes Heart disease in his mother.  ROS:   Please see the history of present illness.    All other systems reviewed and are negative.  EKGs/Labs/Other Studies Reviewed:    The following studies were reviewed today: EKG with sinus rhythm and nonspecific ST-T changes   Recent Labs: 06/25/2020: ALT 16; BUN 15; Creatinine, Ser 0.98; Hemoglobin 14.7; Platelets 216; Potassium 4.7; Sodium 136; TSH 1.960  Recent Lipid Panel    Component Value Date/Time   CHOL 125 06/25/2020 0821   TRIG 124 06/25/2020 0821   HDL 42 06/25/2020 0821   CHOLHDL 3.0 06/25/2020 0821   CHOLHDL 6.1 02/28/2018 0346   VLDL 55 (H) 02/28/2018 0346   LDLCALC 61 06/25/2020 0821    Physical Exam:    VS:  BP (!) 146/62   Pulse (!) 49   Ht 5\' 2"  (1.575 m)   Wt 145 lb (65.8 kg)   SpO2 97%   BMI 26.52 kg/m     Wt Readings from Last 3 Encounters:  11/08/20 145 lb (65.8 kg)  06/11/20 136  lb 0.6 oz (61.7 kg)  12/29/19 138 lb (62.6 kg)     GEN: Patient is in no acute distress HEENT: Normal NECK: No JVD; No carotid bruits LYMPHATICS: No lymphadenopathy CARDIAC: Hear sounds regular, 2/6 systolic murmur at the apex. RESPIRATORY:  Clear to auscultation without rales, wheezing or rhonchi  ABDOMEN: Soft, non-tender, non-distended MUSCULOSKELETAL:  No edema; No deformity  SKIN: Warm and dry NEUROLOGIC:  Alert and oriented x 3 PSYCHIATRIC:  Normal affect   Signed, 02/28/20, MD  11/08/2020 9:07 AM    [facility]

## 2020-11-08 NOTE — Addendum Note (Signed)
Addended by: Eleonore Chiquito on: 11/08/2020 09:28 AM   Modules accepted: Orders

## 2020-11-09 LAB — BASIC METABOLIC PANEL
BUN/Creatinine Ratio: 15 (ref 10–24)
BUN: 14 mg/dL (ref 8–27)
CO2: 22 mmol/L (ref 20–29)
Calcium: 9.3 mg/dL (ref 8.6–10.2)
Chloride: 104 mmol/L (ref 96–106)
Creatinine, Ser: 0.94 mg/dL (ref 0.76–1.27)
Glucose: 127 mg/dL — ABNORMAL HIGH (ref 65–99)
Potassium: 4.7 mmol/L (ref 3.5–5.2)
Sodium: 140 mmol/L (ref 134–144)
eGFR: 89 mL/min/{1.73_m2} (ref >59)

## 2020-11-09 LAB — HEPATIC FUNCTION PANEL
ALT: 16 IU/L (ref 0–44)
AST: 16 IU/L (ref 0–40)
Albumin: 4.3 g/dL (ref 3.8–4.8)
Alkaline Phosphatase: 72 IU/L (ref 44–121)
Bilirubin Total: 0.4 mg/dL (ref 0.0–1.2)
Bilirubin, Direct: 0.14 mg/dL (ref 0.00–0.40)
Total Protein: 7 g/dL (ref 6.0–8.5)

## 2020-11-09 LAB — CBC WITH DIFFERENTIAL/PLATELET
Basophils Absolute: 0.1 10*3/uL (ref 0.0–0.2)
Basos: 1 %
EOS (ABSOLUTE): 0.2 10*3/uL (ref 0.0–0.4)
Eos: 2 %
Hematocrit: 41.2 % (ref 37.5–51.0)
Hemoglobin: 14 g/dL (ref 13.0–17.7)
Immature Grans (Abs): 0 10*3/uL (ref 0.0–0.1)
Immature Granulocytes: 0 %
Lymphocytes Absolute: 2.6 10*3/uL (ref 0.7–3.1)
Lymphs: 34 %
MCH: 29.7 pg (ref 26.6–33.0)
MCHC: 34 g/dL (ref 31.5–35.7)
MCV: 88 fL (ref 79–97)
Monocytes Absolute: 0.5 10*3/uL (ref 0.1–0.9)
Monocytes: 7 %
Neutrophils Absolute: 4.1 10*3/uL (ref 1.4–7.0)
Neutrophils: 56 %
Platelets: 218 10*3/uL (ref 150–450)
RBC: 4.71 x10E6/uL (ref 4.14–5.80)
RDW: 11.9 % (ref 11.6–15.4)
WBC: 7.4 10*3/uL (ref 3.4–10.8)

## 2020-11-09 LAB — LIPID PANEL
Chol/HDL Ratio: 2.6 ratio (ref 0.0–5.0)
Cholesterol, Total: 119 mg/dL (ref 100–199)
HDL: 46 mg/dL (ref >39)
LDL Chol Calc (NIH): 51 mg/dL (ref 0–99)
Triglycerides: 121 mg/dL (ref 0–149)
VLDL Cholesterol Cal: 22 mg/dL (ref 5–40)

## 2020-11-09 LAB — HEMOGLOBIN A1C
Est. average glucose Bld gHb Est-mCnc: 128 mg/dL
Hgb A1c MFr Bld: 6.1 % — ABNORMAL HIGH (ref 4.8–5.6)

## 2020-11-09 LAB — VITAMIN B12: Vitamin B-12: 289 pg/mL (ref 232–1245)

## 2020-11-09 LAB — VITAMIN D 25 HYDROXY (VIT D DEFICIENCY, FRACTURES): Vit D, 25-Hydroxy: 28 ng/mL — ABNORMAL LOW (ref 30.0–100.0)

## 2020-11-09 LAB — TSH: TSH: 1.61 u[IU]/mL (ref 0.450–4.500)

## 2020-11-10 ENCOUNTER — Telehealth: Payer: Self-pay

## 2020-11-10 NOTE — Telephone Encounter (Signed)
Left message on patients voicemail to please return our call.   

## 2020-11-10 NOTE — Telephone Encounter (Signed)
-----   Message from Garwin Brothers, MD sent at 11/09/2020 12:02 PM EDT ----- The results of the study is unremarkable. Please inform patient. I will discuss in detail at next appointment. Cc  primary care/referring physician Garwin Brothers, MD 11/09/2020 12:02 PM

## 2020-11-11 ENCOUNTER — Telehealth: Payer: Self-pay

## 2020-11-11 NOTE — Telephone Encounter (Signed)
-----   Message from Rajan R Revankar, MD sent at 11/09/2020 12:02 PM EDT ----- The results of the study is unremarkable. Please inform patient. I will discuss in detail at next appointment. Cc  primary care/referring physician Rajan R Revankar, MD 11/09/2020 12:02 PM  

## 2020-11-11 NOTE — Telephone Encounter (Signed)
Left message on patients voicemail to please return our call.   I will mail the patient a letter at this time after trying to reach him x3 with no success.

## 2020-12-15 ENCOUNTER — Other Ambulatory Visit: Payer: Self-pay | Admitting: Cardiology

## 2020-12-15 NOTE — Telephone Encounter (Signed)
Refill sent to pharmacy.   

## 2021-02-16 ENCOUNTER — Encounter (HOSPITAL_COMMUNITY): Payer: Self-pay

## 2021-02-16 ENCOUNTER — Observation Stay (HOSPITAL_COMMUNITY)
Admission: EM | Admit: 2021-02-16 | Discharge: 2021-02-17 | Disposition: A | Discharge status: Home / Self Care | Payer: Medicare Other | Attending: Emergency Medicine | Admitting: Emergency Medicine

## 2021-02-16 ENCOUNTER — Emergency Department (HOSPITAL_COMMUNITY): Payer: Medicare Other

## 2021-02-16 ENCOUNTER — Other Ambulatory Visit: Payer: Self-pay

## 2021-02-16 DIAGNOSIS — I2510 Atherosclerotic heart disease of native coronary artery without angina pectoris: Principal | ICD-10-CM | POA: Diagnosis present

## 2021-02-16 DIAGNOSIS — Z951 Presence of aortocoronary bypass graft: Secondary | ICD-10-CM | POA: Diagnosis not present

## 2021-02-16 DIAGNOSIS — Z955 Presence of coronary angioplasty implant and graft: Secondary | ICD-10-CM | POA: Diagnosis not present

## 2021-02-16 DIAGNOSIS — E785 Hyperlipidemia, unspecified: Secondary | ICD-10-CM | POA: Diagnosis present

## 2021-02-16 DIAGNOSIS — Z20822 Contact with and (suspected) exposure to covid-19: Secondary | ICD-10-CM | POA: Diagnosis not present

## 2021-02-16 DIAGNOSIS — I5042 Chronic combined systolic (congestive) and diastolic (congestive) heart failure: Secondary | ICD-10-CM | POA: Diagnosis not present

## 2021-02-16 DIAGNOSIS — Z7984 Long term (current) use of oral hypoglycemic drugs: Secondary | ICD-10-CM | POA: Insufficient documentation

## 2021-02-16 DIAGNOSIS — I10 Essential (primary) hypertension: Secondary | ICD-10-CM | POA: Diagnosis present

## 2021-02-16 DIAGNOSIS — R001 Bradycardia, unspecified: Secondary | ICD-10-CM

## 2021-02-16 DIAGNOSIS — I110 Hypertensive heart disease with heart failure: Secondary | ICD-10-CM | POA: Insufficient documentation

## 2021-02-16 DIAGNOSIS — E7800 Pure hypercholesterolemia, unspecified: Secondary | ICD-10-CM | POA: Diagnosis not present

## 2021-02-16 DIAGNOSIS — E119 Type 2 diabetes mellitus without complications: Secondary | ICD-10-CM

## 2021-02-16 DIAGNOSIS — Z7902 Long term (current) use of antithrombotics/antiplatelets: Secondary | ICD-10-CM | POA: Diagnosis not present

## 2021-02-16 DIAGNOSIS — R0789 Other chest pain: Secondary | ICD-10-CM

## 2021-02-16 DIAGNOSIS — I771 Stricture of artery: Secondary | ICD-10-CM | POA: Diagnosis present

## 2021-02-16 DIAGNOSIS — R079 Chest pain, unspecified: Secondary | ICD-10-CM | POA: Diagnosis present

## 2021-02-16 DIAGNOSIS — Z79899 Other long term (current) drug therapy: Secondary | ICD-10-CM | POA: Diagnosis not present

## 2021-02-16 DIAGNOSIS — I2583 Coronary atherosclerosis due to lipid rich plaque: Secondary | ICD-10-CM

## 2021-02-16 DIAGNOSIS — I200 Unstable angina: Secondary | ICD-10-CM

## 2021-02-16 HISTORY — DX: Chest pain, unspecified: R07.9

## 2021-02-16 LAB — COMPREHENSIVE METABOLIC PANEL
ALT: 17 U/L (ref 0–44)
AST: 19 U/L (ref 15–41)
Albumin: 3.8 g/dL (ref 3.5–5.0)
Alkaline Phosphatase: 61 U/L (ref 38–126)
Anion gap: 6 (ref 5–15)
BUN: 14 mg/dL (ref 8–23)
CO2: 23 mmol/L (ref 22–32)
Calcium: 9.1 mg/dL (ref 8.9–10.3)
Chloride: 107 mmol/L (ref 98–111)
Creatinine, Ser: 1 mg/dL (ref 0.61–1.24)
GFR, Estimated: >60 mL/min (ref >60)
Glucose, Bld: 122 mg/dL — ABNORMAL HIGH (ref 70–99)
Potassium: 3.9 mmol/L (ref 3.5–5.1)
Sodium: 136 mmol/L (ref 135–145)
Total Bilirubin: 0.5 mg/dL (ref 0.3–1.2)
Total Protein: 6.6 g/dL (ref 6.5–8.1)

## 2021-02-16 LAB — CBC
HCT: 41 % (ref 39.0–52.0)
HCT: 43.6 % (ref 39.0–52.0)
Hemoglobin: 13.3 g/dL (ref 13.0–17.0)
Hemoglobin: 14.3 g/dL (ref 13.0–17.0)
MCH: 29.8 pg (ref 26.0–34.0)
MCH: 29.9 pg (ref 26.0–34.0)
MCHC: 32.4 g/dL (ref 30.0–36.0)
MCHC: 32.8 g/dL (ref 30.0–36.0)
MCV: 91.9 fL (ref 80.0–100.0)
MCV: 91 fL (ref 80.0–100.0)
Platelets: 193 10*3/uL (ref 150–400)
Platelets: 199 10*3/uL (ref 150–400)
RBC: 4.46 MIL/uL (ref 4.22–5.81)
RBC: 4.79 MIL/uL (ref 4.22–5.81)
RDW: 13.2 % (ref 11.5–15.5)
RDW: 13.2 % (ref 11.5–15.5)
WBC: 8.2 10*3/uL (ref 4.0–10.5)
WBC: 7.3 10*3/uL (ref 4.0–10.5)
nRBC: 0 % (ref 0.0–0.2)
nRBC: 0 % (ref 0.0–0.2)

## 2021-02-16 LAB — GLUCOSE, CAPILLARY
Glucose-Capillary: 116 mg/dL — ABNORMAL HIGH (ref 70–99)
Glucose-Capillary: 173 mg/dL — ABNORMAL HIGH (ref 70–99)

## 2021-02-16 LAB — CREATININE, SERUM
Creatinine, Ser: 0.95 mg/dL (ref 0.61–1.24)
GFR, Estimated: >60 mL/min (ref >60)

## 2021-02-16 LAB — TROPONIN I (HIGH SENSITIVITY)
Troponin I (High Sensitivity): 24 ng/L — ABNORMAL HIGH (ref <18)
Troponin I (High Sensitivity): 14 ng/L (ref <18)

## 2021-02-16 LAB — HIV ANTIBODY (ROUTINE TESTING W REFLEX): HIV Screen 4th Generation wRfx: NONREACTIVE

## 2021-02-16 LAB — SARS CORONAVIRUS 2 BY RT PCR (HOSPITAL ORDER, PERFORMED IN ~~LOC~~ HOSPITAL LAB): SARS Coronavirus 2: NEGATIVE

## 2021-02-16 MED ORDER — ASPIRIN 81 MG PO CHEW
324.0000 mg | CHEWABLE_TABLET | ORAL | Status: AC
  Administered 2021-02-16: 324 mg via ORAL
  Filled 2021-02-16: qty 4

## 2021-02-16 MED ORDER — ASPIRIN EC 81 MG PO TBEC: 81.0000 mg | DELAYED_RELEASE_TABLET | Freq: Every day | ORAL | Status: DC

## 2021-02-16 MED ORDER — NITROGLYCERIN 0.4 MG SL SUBL: 0.4000 mg | SUBLINGUAL_TABLET | SUBLINGUAL | Status: DC | PRN

## 2021-02-16 MED ORDER — ONDANSETRON HCL 4 MG/2ML IJ SOLN: 4.0000 mg | Freq: Four times a day (QID) | INTRAMUSCULAR | Status: DC | PRN

## 2021-02-16 MED ORDER — ASPIRIN 81 MG PO CHEW
81.0000 mg | CHEWABLE_TABLET | ORAL | Status: AC
  Administered 2021-02-17: 81 mg via ORAL
  Filled 2021-02-16: qty 1

## 2021-02-16 MED ORDER — SODIUM CHLORIDE 0.9% FLUSH: 3.0000 mL | Freq: Two times a day (BID) | INTRAVENOUS | Status: DC

## 2021-02-16 MED ORDER — ASPIRIN 300 MG RE SUPP: 300.0000 mg | RECTAL | Status: AC

## 2021-02-16 MED ORDER — ATORVASTATIN CALCIUM 80 MG PO TABS
80.0000 mg | ORAL_TABLET | Freq: Every day | ORAL | Status: DC
  Administered 2021-02-17: 40 mg via ORAL
  Filled 2021-02-16: qty 1
  Filled 2021-02-16: qty 2

## 2021-02-16 MED ORDER — RANOLAZINE ER 500 MG PO TB12
1000.0000 mg | ORAL_TABLET | Freq: Two times a day (BID) | ORAL | Status: DC
  Administered 2021-02-16 – 2021-02-17 (×2): 1000 mg via ORAL
  Filled 2021-02-16 (×2): qty 2

## 2021-02-16 MED ORDER — ACETAMINOPHEN 325 MG PO TABS: 650.0000 mg | ORAL_TABLET | ORAL | Status: DC | PRN

## 2021-02-16 MED ORDER — CLOPIDOGREL BISULFATE 75 MG PO TABS
75.0000 mg | ORAL_TABLET | ORAL | Status: AC
  Administered 2021-02-17: 75 mg via ORAL
  Filled 2021-02-16: qty 1

## 2021-02-16 MED ORDER — HEPARIN SODIUM (PORCINE) 5000 UNIT/ML IJ SOLN
5000.0000 [IU] | Freq: Three times a day (TID) | INTRAMUSCULAR | Status: DC
  Administered 2021-02-16: 5000 [IU] via SUBCUTANEOUS
  Filled 2021-02-16: qty 1

## 2021-02-16 MED ORDER — SODIUM CHLORIDE 0.9% FLUSH: 3.0000 mL | INTRAVENOUS | Status: DC | PRN

## 2021-02-16 MED ORDER — SODIUM CHLORIDE 0.9 % WEIGHT BASED INFUSION
3.0000 mL/kg/h | INTRAVENOUS | Status: DC
  Administered 2021-02-17: 3 mL/kg/h via INTRAVENOUS

## 2021-02-16 MED ORDER — SODIUM CHLORIDE 0.9 % WEIGHT BASED INFUSION: 1.0000 mL/kg/h | INTRAVENOUS | Status: DC

## 2021-02-16 MED ORDER — CLOPIDOGREL BISULFATE 75 MG PO TABS
75.0000 mg | ORAL_TABLET | Freq: Every day | ORAL | Status: DC
  Administered 2021-02-16: 75 mg via ORAL
  Filled 2021-02-16: qty 1

## 2021-02-16 MED ORDER — INSULIN ASPART 100 UNIT/ML IJ SOLN: 0.0000 [IU] | Freq: Every day | INTRAMUSCULAR | Status: DC

## 2021-02-16 MED ORDER — METOPROLOL TARTRATE 25 MG PO TABS: 12.5000 mg | ORAL_TABLET | Freq: Two times a day (BID) | ORAL | Status: DC

## 2021-02-16 MED ORDER — PNEUMOCOCCAL VAC POLYVALENT 25 MCG/0.5ML IJ INJ
0.5000 mL | INJECTION | INTRAMUSCULAR | Status: DC
  Filled 2021-02-16: qty 0.5

## 2021-02-16 MED ORDER — LOSARTAN POTASSIUM 25 MG PO TABS
25.0000 mg | ORAL_TABLET | Freq: Every day | ORAL | Status: DC
  Administered 2021-02-16 – 2021-02-17 (×2): 25 mg via ORAL
  Filled 2021-02-16 (×2): qty 1

## 2021-02-16 MED ORDER — SODIUM CHLORIDE 0.9 % IV SOLN: 250.0000 mL | INTRAVENOUS | Status: DC | PRN

## 2021-02-16 MED ORDER — INSULIN ASPART 100 UNIT/ML IJ SOLN: 0.0000 [IU] | Freq: Three times a day (TID) | INTRAMUSCULAR | Status: DC

## 2021-02-16 NOTE — Progress Notes (Signed)
Nicholas [name] BMW:[id number] DOB: December 07, 1955 DOA: 02/16/2021  PCP: Anselmo Pickler, MD Consultants:  Revankar - cardiology   Chief Complaint: chest pain  HPI: Nicholas [name] is a 65 y.o. male with medical history significant of CAD s/p CABG (1990) and stents (2019); HTN; DM; HLD; and chronic systolic CHF presenting with CP.   ED Course: h/o NSTEMI presenting with CP c/w prior.  No recent stress test.  Cardiology Dr. Tomie China recommends stress test and d/c today if negative.  As such, I have recommended consulting cardiology to see if they can arrange this for the patient.  This patient may not require admission.    Jonah Blue MD [facility]    02/16/2021, 7:46 AM

## 2021-02-16 NOTE — Progress Notes (Signed)
Pt would like to walk the hall instead of hep sub Q

## 2021-02-16 NOTE — ED Notes (Signed)
PT walked to bathroom

## 2021-02-16 NOTE — ED Provider Notes (Signed)
[facility]ENCY [facility]MENT Provider Note   CSN: [id number] Arrival date & time: 02/16/21  0234     History Chief Complaint  Patient presents with   Chest Pain    Nicholas BignessGurdev Leyba is a 65 y.o. male.  65 y.o male with a PMH of CAD, NSTEMI, HTN, CABG x 3 presents to the ED with a chief complaint of exertional chest pain x 2 days.  Pain is described as sharp intermittent substernal without any radiation, in addition the pain is exacerbated with ambulation.  No alleviating factors Nicholas [name] reports feeling very fatigued along with generalized weakness. Nicholas [name] is followed by Dr. Tomie Chinaevankar, last appointment in April 2022. Nicholas [name] denies any fever, leg swelling, cough or prior hx of blood clots.  Of note, according to records currently on Plavix.    The history is provided by the patient and medical records.  Chest Pain Pain location:  Substernal area Pain quality: sharp   Pain radiates to:  Does not radiate Pain severity:  Mild Duration:  2 days Timing:  Intermittent Progression:  Unchanged Chronicity:  New Context: movement   Relieved by:  Nothing Worsened by:  Nothing Ineffective treatments:  None tried Associated symptoms: shortness of breath and weakness   Associated symptoms: no abdominal pain, no altered mental status, no back pain, no dizziness, no fever, no headache, no lower extremity edema, no nausea, no numbness and no vomiting   Risk factors: coronary artery disease, diabetes mellitus, high cholesterol, hypertension and male sex   Risk factors: no prior DVT/PE and no smoking       Past Medical History:  Diagnosis Date   CAD (coronary artery disease) 07/15/2018   CAD S/P percutaneous coronary angioplasty 05/29/2018   S/P native CTO CFX and 70% LAD PCI with DES 05/16/18 after early occlusion of his grafts   Carotid stenosis 02/21/2019   Chronic systolic (congestive) heart failure (HCC) 07/15/2018   Dyslipidemia, goal LDL below 70 03/27/2018   Hyperlipidemia    Heart murmur    HTN (hypertension) 07/15/2018   Non-insulin dependent type 2 diabetes mellitus (HCC) 03/27/2018   PONV (postoperative nausea and vomiting)    Pulmonary nodules 07/15/2018   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 Normal LVF pre op     Subclavian arterial stenosis (HCC) 01/19/2012   Left subclavian stenosis 75% with pressure gradient of 60 mm Hg at cath    Patient Active Problem List   Diagnosis Date Noted   Chest pain 02/16/2021   Coronary artery disease    Heart murmur    High cholesterol    Hypertension    PONV (postoperative nausea and vomiting)    Type II diabetes mellitus (HCC)    Carotid stenosis 02/21/2019   CAD (coronary artery disease) 07/15/2018   Syncope 07/15/2018   HTN (hypertension) 07/15/2018   Pulmonary nodules 07/15/2018   Chronic systolic (congestive) heart failure (HCC) 07/15/2018   Diabetes mellitus due to underlying condition with unspecified complications (HCC) 05/31/2018   Non-insulin dependent type 2 diabetes mellitus (HCC) 03/27/2018   Dyslipidemia, goal LDL below 70 03/27/2018   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   Stricture of artery (HCC) 02/02/2012   Subclavian arterial stenosis (HCC) 01/19/2012    Past Surgical History:  Procedure Laterality Date   CATARACT EXTRACTION W/ INTRAOCULAR LENS  IMPLANT, BILATERAL Bilateral 2012-2017  CORONARY ARTERY BYPASS GRAFT N/A 03/04/2018   Procedure: CORONARY ARTERY BYPASS GRAFTING (CABG) x 3: -FREE LIMA to LAD, -SVG to DIAGONAL, -SVG to OM; ENDOSCOPIC HARVEST GREATER SAPHENOUS VEIN: -Right Thigh  ;  Surgeon: Kerin Perna, MD;  Location: [facility] OR;  Service: Open Heart Surgery;  Laterality: N/A;   CORONARY STENT INTERVENTION N/A 05/20/2018   Procedure: CORONARY STENT INTERVENTION;  Surgeon: Lyn Records, MD;  Location: [facility] INVASIVE CV LAB;   Service: Cardiovascular;  Laterality: N/A;   EYE SURGERY     INTRAVASCULAR PRESSURE WIRE/FFR STUDY N/A 03/01/2018   Procedure: INTRAVASCULAR PRESSURE WIRE/FFR STUDY;  Surgeon: Swaziland, Peter M, MD;  Location: [facility] INVASIVE CV LAB;  Service: Cardiovascular;  Laterality: N/A;   LEFT HEART CATH AND CORONARY ANGIOGRAPHY N/A 03/01/2018   Procedure: LEFT HEART CATH AND CORONARY ANGIOGRAPHY;  Surgeon: Swaziland, Peter M, MD;  Location: [facility] INVASIVE CV LAB;  Service: Cardiovascular;  Laterality: N/A;   LEFT HEART CATH AND CORS/GRAFTS ANGIOGRAPHY N/A 05/16/2018   Procedure: LEFT HEART CATH AND CORS/GRAFTS ANGIOGRAPHY;  Surgeon: Lennette Bihari, MD;  Location: [facility] INVASIVE CV LAB;  Service: Cardiovascular;  Laterality: N/A;   LEFT HEART CATH AND CORS/GRAFTS ANGIOGRAPHY N/A 02/17/2021   Procedure: LEFT HEART CATH AND CORS/GRAFTS ANGIOGRAPHY;  Surgeon: Kathleene Hazel, MD;  Location: [facility] INVASIVE CV LAB;  Service: Cardiovascular;  Laterality: N/A;   TEE WITHOUT CARDIOVERSION N/A 03/04/2018   Procedure: TRANSESOPHAGEAL ECHOCARDIOGRAM (TEE);  Surgeon: Donata Clay, Theron Arista, MD;  Location: [facility] OR;  Service: Open Heart Surgery;  Laterality: N/A;       Family History  Problem Relation Age of Onset   Heart disease Mother     Social History   Tobacco Use   Smoking status: Never   Smokeless tobacco: Never  Vaping Use   Vaping Use: Never used  Substance Use Topics   Alcohol use: Not Currently    Comment: 05/20/2018 "stopped before 1999"   Drug use: Never    Home Medications Prior to Admission medications   Medication Sig Start Date End Date Taking? Authorizing Provider  atorvastatin (LIPITOR) 40 MG tablet Take 1 tablet (40 mg total) by mouth daily. 02/17/21 08/16/21 Yes Marjie Skiff E, PA-C  clopidogrel (PLAVIX) 75 MG tablet Take 75 mg by mouth daily.   Yes [provider]  finasteride (PROSCAR) 5 MG tablet Take 5 mg by mouth daily.   Yes [provider]  metFORMIN (GLUCOPHAGE) 500 MG tablet  Take 500 mg by mouth 2 (two) times daily with a meal.   Yes [provider]  NEOMYCIN-POLYMYXIN-HYDROCORTISONE (CORTISPORIN) 1 % SOLN OTIC solution 2 drops See admin instructions. Instill 2 drops into the affected ear twice a day as directed 02/11/21  Yes [provider]  nitroGLYCERIN (NITROSTAT) 0.4 MG SL tablet Place 0.4 mg under the tongue every 5 (five) minutes as needed for chest pain.   Yes [provider]  ranolazine (RANEXA) 1000 MG SR tablet TAKE ONE TABLET BY MOUTH TWICE A DAY Patient taking differently: Take 1,000 mg by mouth 2 (two) times daily. 12/15/20  Yes Revankar, Aundra Dubin, MD  losartan (COZAAR) 25 MG tablet Take 1 tablet (25 mg total) by mouth daily. 02/18/21   Corrin Parker, PA-C  nitroGLYCERIN (NITROSTAT) 0.4 MG SL tablet Place 1 tablet (0.4 mg total) under the tongue every 5 (five) minutes x 3 doses as needed for chest pain. 02/17/21   Marjie Skiff E, PA-C    Allergies    Eggs or egg-derived  products and Latex  Review of Systems   Review of Systems  Constitutional:  Negative for chills and fever.  HENT:  Negative for sore throat.   Respiratory:  Positive for shortness of breath.   Cardiovascular:  Positive for chest pain.  Gastrointestinal:  Negative for abdominal pain, diarrhea, nausea and vomiting.  Genitourinary:  Negative for flank pain.  Musculoskeletal:  Negative for back pain.  Skin:  Negative for pallor and wound.  Neurological:  Positive for weakness. Negative for dizziness, light-headedness, numbness and headaches.  All other systems reviewed and are negative.  Physical Exam Updated Vital Signs BP 138/80   Pulse 60   Temp 98.3 F (36.8 C) (Oral)   Resp 14   Ht 5\' 2"  (1.575 m)   Wt 62.1 kg Comment: scale B  SpO2 98%   BMI 25.06 kg/m   Physical Exam Vitals and nursing note reviewed.  Constitutional:      Appearance: Nicholas [name] is well-developed. Nicholas [name] is not ill-appearing or toxic-appearing.     Comments: Non toxic  appearance.   HENT:     Head: Normocephalic and atraumatic.  Cardiovascular:     Rate and Rhythm: Bradycardia present.  Pulmonary:     Effort: Pulmonary effort is normal. No respiratory distress.     Breath sounds: Normal breath sounds. No decreased breath sounds or wheezing.  Chest:     Chest wall: No tenderness.  Abdominal:     Palpations: Abdomen is soft.     Tenderness: There is no abdominal tenderness.  Musculoskeletal:     Cervical back: Normal range of motion and neck supple.     Right lower leg: No tenderness. No edema.     Left lower leg: No tenderness. No edema.  Skin:    General: Skin is warm and dry.  Neurological:     Mental Status: Nicholas [name] is alert.    ED Results / Procedures / Treatments   Labs (all labs ordered are listed, but only abnormal results are displayed) Labs Reviewed  COMPREHENSIVE METABOLIC PANEL - Abnormal; Notable for the following components:      Result Value   Glucose, Bld 122 (*)    All other components within normal limits  BASIC METABOLIC PANEL - Abnormal; Notable for the following components:   Glucose, Bld 143 (*)    All other components within normal limits  GLUCOSE, CAPILLARY - Abnormal; Notable for the following components:   Glucose-Capillary 116 (*)    All other components within normal limits  GLUCOSE, CAPILLARY - Abnormal; Notable for the following components:   Glucose-Capillary 173 (*)    All other components within normal limits  GLUCOSE, CAPILLARY - Abnormal; Notable for the following components:   Glucose-Capillary 127 (*)    All other components within normal limits  GLUCOSE, CAPILLARY - Abnormal; Notable for the following components:   Glucose-Capillary 124 (*)    All other components within normal limits  GLUCOSE, CAPILLARY - Abnormal; Notable for the following components:   Glucose-Capillary 255 (*)    All other components within normal limits  TROPONIN I (HIGH SENSITIVITY) - Abnormal; Notable for the following  components:   Troponin I (High Sensitivity) 24 (*)    All other components within normal limits  SARS CORONAVIRUS 2 BY RT PCR (HOSPITAL ORDER, PERFORMED IN [facility])  CBC  HIV ANTIBODY (ROUTINE TESTING W REFLEX)  CBC  CREATININE, SERUM  D-DIMER, QUANTITATIVE  TROPONIN I (HIGH SENSITIVITY)    EKG EKG Interpretation  Date/Time:  Wednesday February 16 2021 02:41:20 EDT Ventricular Rate:  58 PR Interval:  166 QRS Duration: 108 QT Interval:  438 QTC Calculation: 429 R Axis:   -9 Text Interpretation: Sinus bradycardia Left ventricular hypertrophy with repolarization abnormality ( Cornell product ) Abnormal ECG st depression in inferior and lateral leads seen on prior Confirmed by Melene Plan [phone]) on 02/16/2021 7:14:48 AM  Radiology No results found.  Procedures Procedures   Medications Ordered in ED Medications  0.9 %  sodium chloride infusion (has no administration in time range)  aspirin chewable tablet 324 mg (324 mg Oral Given 02/16/21 1714)    Or  aspirin suppository 300 mg ( Rectal See Alternative 02/16/21 1714)  aspirin chewable tablet 81 mg (81 mg Oral Given 02/17/21 0515)  clopidogrel (PLAVIX) tablet 75 mg (75 mg Oral Given 02/17/21 0515)    ED Course  I have reviewed the triage vital signs and the nursing notes.  Pertinent labs & imaging results that were available during my care of the patient were reviewed by me and considered in my medical decision making (see chart for details).    MDM Rules/Calculators/A&P   Patient with a prior history of CAD, CABG in 2019 by Dr. Tomie China presents to the ED with a chief complaint of exertional chest pain for the past 2 days.  Pain is described as sharp substernal without any radiation, rates it about a 5 out of 10.  Does report that rest improves some of the pain although Nicholas [name] also stated that drinking some water relieves some of the pain.  Nicholas [name] did not take any nitro prior to arrival in the ED.  Denies any fever,  infectious symptoms.  During primary evaluation after a 3-hour wait in our waiting room, evaluated patient in the room, Nicholas [name] is overall nontoxic, lungs clear to auscultation.  Pain is not reproducible with palpation.  Does report pain, waxes and wanes.  [place] is soft, nontender to palpation.  Bilateral legs without any calf tenderness, no pitting edema or swelling noted.  Moves all upper and lower extremities.  Interpretation of his blood work reveal CBC without leukocytosis, no signs of anemia.  CMP without any electrolyte derangement, creatinine levels within normal limits.  LFTs are unremarkable.  First troponin is 24 which was obtained 4 hours ago, second 1 is 14.  Chest x-ray without any infiltrates, no pneumothorax or pleural effusion.  No hypoxia, no tachycardia, currently on Plavix, lower suspicion for PE. Nicholas [name] is also currently on Ranexa, stronger suspicion for ACS with an elevated troponin along with strong history.   Extensive chart review which showed last ECHO Complete 07/03/2018 LV EF: 40% -   45%   7:13 AM spoke to Dr. Tomie China, who agreed with bringing in patient for a stress test along with trending of troponin due to his high cardiac history.  I have a discussed case with my attending Dr. Adela Lank who agrees with plan and management.   3:24 PM patient has been seen by cardiology APP, currently awaiting cardiologist attending for further recommendations.  Of note, cardiology was first paged this morning around 8 AM, there was a delay in disposition.  Patient has remained n.p.o., only able to drink water thus far.  SusPECT patient will need admission for ACS.  Patient admitted by cardiology for further management.   Portions of this note were generated with Scientist, clinical (histocompatibility and immunogenetics). Dictation errors may occur despite best attempts at proofreading.  Final Clinical Impression(s) / ED Diagnoses Final diagnoses:  Atypical chest  pain    Rx / DC Orders ED Discharge Orders           Ordered    losartan (COZAAR) 25 MG tablet  Daily        02/17/21 1545    atorvastatin (LIPITOR) 40 MG tablet  Daily        02/17/21 1545    nitroGLYCERIN (NITROSTAT) 0.4 MG SL tablet  Every 5 min x3 PRN        02/17/21 1545    Increase activity slowly        02/17/21 1545    Diet - low sodium heart healthy        02/17/21 1545    Increase activity slowly        02/17/21 1653    Call MD for:  redness, tenderness, or signs of infection (pain, swelling, redness, odor or green/yellow discharge around incision site)        02/17/21 1653             Claude Manges, PA-C 03/16/21 0717    Melene Plan, DO 03/18/21 1006

## 2021-02-16 NOTE — ED Provider Notes (Signed)
65 yo male with exertional CP, awaiting cardiology dispo.  Assumed care at change of shift, see prior note for complete H&P. Physical Exam  BP 138/82   Pulse (!) 48   Temp 98.2 F (36.8 C) (Oral)   Resp 17   Ht 5\' 2"  (1.575 m)   Wt 61.2 kg   SpO2 100%   BMI 24.69 kg/m   Physical Exam  ED Course/Procedures     Procedures  MDM  Patient was seen by cardiology who will admit.       , PA-C 02/16/21 1607    02/18/21, MD 02/17/21 [phone]

## 2021-02-16 NOTE — Discharge Instructions (Addendum)
Medication Changes: - STOP Metoprolol. - START Losartan 25mg  daily. This medication is for your blood pressure. - Have sent in a new prescription of Atorvastatin (Lipitor) for 40mg  daily.  Given heart disease, recommended avoiding NSAIDs (Advil, Motrin, Ibuprofen, Aleve, Diclofenac, etc.). If you need anything for pain, recommend Tylenol instead as long as you don't have any liver disease.  Post Cardiac Catheterization: NO HEAVY LIFTING OR SEXUAL ACTIVITY X 7 DAYS. NO DRIVING X 2-3 DAYS. NO SOAKING BATHS, HOT TUBS, POOLS, ETC., X 7 DAYS.  Groin Site Care: Refer to this sheet in the next few weeks. These instructions provide you with information on caring for yourself after your procedure. Your caregiver may also give you more specific instructions. Your treatment has been planned according to current medical practices, but problems sometimes occur. Call your caregiver if you have any problems or questions after your procedure. HOME CARE INSTRUCTIONS You may shower 24 hours after the procedure. Remove the bandage (dressing) and gently wash the site with plain soap and water. Gently pat the site dry.  Do not apply powder or lotion to the site.  Do not sit in a bathtub, swimming pool, or whirlpool for 5 to 7 days.  No bending, squatting, or lifting anything over 10 pounds (4.5 kg) as directed by your caregiver.  Inspect the site at least twice daily.  Do not drive home if you are discharged the same day of the procedure. Have someone else drive you.  What to expect: Any bruising will usually fade within 1 to 2 weeks.  Blood that collects in the tissue (hematoma) may be painful to the touch. It should usually decrease in size and tenderness within 1 to 2 weeks.  SEEK IMMEDIATE MEDICAL CARE IF: You have unusual pain at the groin site or down the affected leg.  You have redness, warmth, swelling, or pain at the groin site.  You have drainage (other than a small amount of blood on the dressing).   You have chills.  You have a fever or persistent symptoms for more than 72 hours.  You have a fever and your symptoms suddenly get worse.  Your leg becomes pale, cool, tingly, or numb.  You have heavy bleeding from the site. Hold pressure on the site.

## 2021-02-16 NOTE — ED Provider Notes (Signed)
MSE was initiated and I personally evaluated the patient and placed orders (if any) at  2:44 AM on February 16, 2021.  Patient with history of CABG (1990), HTN, DM, HLD to ED with chest pain that started yesterday and has been intermittent since onset. No SOB, nausea, cough, congestion or fever. No nausea, vomiting. "Feels different than before". He has NTG but did not take any prior to arrival.   In NAD RRR Lungs clear  The patient appears stable so that the remainder of the MSE may be completed by another provider.   Elpidio Anis, PA-C 02/16/21 0246    Gilda Crease, MD 02/16/21 [phone]

## 2021-02-16 NOTE — ED Triage Notes (Signed)
Pt reports intermittent CP that began yesterday in his center chest. Pt attempted to drink water, hiccup, and other remedies to which his doctor told him to come to ED. Pt has hx of open heart surgery and stents. Pt worried this feels similar to the pain he had prior to his heart surgery. No other sx.

## 2021-02-16 NOTE — Significant Event (Signed)
Pt alert and oriented from ED with no chest pain no IV and covid test completes.

## 2021-02-16 NOTE — H&P (Signed)
Cardiology Consultation:   Patient ID: Nicholas [name]; [id number]; 1956-02-23   Admit date: 02/16/2021 Date of Consult: 02/16/2021  Primary Care Provider: Anselmo Pickler, MD Primary Cardiologist: Dr. Josiah Lobo, MD   Patient Profile:   Nicholas [name] is a 65 y.o. male with a hx of CAD s/p CABG x3 (free LIMA to LAD, SVG to OM, SVG to Diag) 02/2018 with early graft failure per cath 04/2018 showing native CTO of LCx and 70% LAD s/p PCI/DES 04/2018 (has residual RCA CTO), HTN, HLD, left [place] artery stenosis and DM2 who is being seen today for the evaluation of chest pain at the request of Johana Soto-PA.  History of Present Illness:   Nicholas [name] 65yo M with a hx as stated above who presented to [facility] with chest pain that began approximately 2am this morning. He stated that he has been in his usual state of health until this episode. He woke to use the restroom and states he started having anterior chest pain that seemed reminiscent of his prior anginal symptoms before bypass surgery. He states that he drank water to see if his symptoms would subside which they did not however by ED arrival, he was chest pain free with no recurrence. He denies any recent chest pain leading up to yesterday, no SOB, LE edema, palpitations, orthopnea, or syncope. He walks 7 days a week for exercise with no symptoms. He is compliant with his medications. Given his hx of complicated CAD, he presented to the ED for further evaluation.   He initially presented to 02/27/2018 with chest pain and EKG changes. He ruled in for NSTEMI with a peak Trop at 0.11. LHC 02/28/2018 showed severe three vessel disease. Echocardiogram with normal LVEF. He underwent CABG x 3 on 03/04/18 (using a free LIMA). He tolerated this well. He had an unremarkable post op course and was seen in the office 03/27/18 and again 04/24/18 for follow up and was noted to be doing well from a CV standpoint. He then presented to the ED 05/14/2018 with NSTEMI with a peak Trop at  3.0. CCTA performed that showed premature graft failure. Repeat cath performed showing occlusion of all of his grafts. He underwent successful PCI to 70% native LAD and PCI to CTO of his CFX. He has residulal CTO of his RCA with R-R and L-R collaterals. He has had no further ischemic workup since that time.   In follow up after stent placement he was placed on on Imdur and Ranexa. In most recent follow up he was doing well from a CV standpoint. He was walking 5 days per week with no anginal symptoms.   Past Medical History:  Diagnosis Date   CAD (coronary artery disease) 07/15/2018   CAD S/P percutaneous coronary angioplasty 05/29/2018   S/P native CTO CFX and 70% LAD PCI with DES 05/16/18 after early occlusion of his grafts   Carotid stenosis 02/21/2019   Chronic systolic (congestive) heart failure (HCC) 07/15/2018   Dyslipidemia, goal LDL below 70 03/27/2018   Hyperlipidemia   Heart murmur    HTN (hypertension) 07/15/2018   Non-insulin dependent type 2 diabetes mellitus (HCC) 03/27/2018   PONV (postoperative nausea and vomiting)    Pulmonary nodules 07/15/2018   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   03/04/18- Coronary artery bypass grafting x3 (free left internal mammary artery graft to left anterior descending, saphenous vein graft to obtuse marginal, saphenous vein graft to diagonal. Normal LVF pre op     Subclavian arterial stenosis (HCC)  01/19/2012   Left subclavian stenosis 75% with pressure gradient of 60 mm Hg at cath    Past Surgical History:  Procedure Laterality Date   CATARACT EXTRACTION W/ INTRAOCULAR LENS  IMPLANT, BILATERAL Bilateral 2012-2017   CORONARY ARTERY BYPASS GRAFT N/A 03/04/2018   Procedure: CORONARY ARTERY BYPASS GRAFTING (CABG) x 3: -FREE LIMA to LAD, -SVG to DIAGONAL, -SVG to OM; ENDOSCOPIC HARVEST GREATER SAPHENOUS VEIN: -Right Thigh  ;  Surgeon: Kerin Perna, MD;  Location: [facility] OR;  Service: Open Heart Surgery;  Laterality: N/A;   CORONARY STENT INTERVENTION N/A  05/20/2018   Procedure: CORONARY STENT INTERVENTION;  Surgeon: Lyn Records, MD;  Location: [facility] INVASIVE CV LAB;  Service: Cardiovascular;  Laterality: N/A;   EYE SURGERY     INTRAVASCULAR PRESSURE WIRE/FFR STUDY N/A 03/01/2018   Procedure: INTRAVASCULAR PRESSURE WIRE/FFR STUDY;  Surgeon: Swaziland, Peter M, MD;  Location: [facility] INVASIVE CV LAB;  Service: Cardiovascular;  Laterality: N/A;   LEFT HEART CATH AND CORONARY ANGIOGRAPHY N/A 03/01/2018   Procedure: LEFT HEART CATH AND CORONARY ANGIOGRAPHY;  Surgeon: Swaziland, Peter M, MD;  Location: [facility] INVASIVE CV LAB;  Service: Cardiovascular;  Laterality: N/A;   LEFT HEART CATH AND CORS/GRAFTS ANGIOGRAPHY N/A 05/16/2018   Procedure: LEFT HEART CATH AND CORS/GRAFTS ANGIOGRAPHY;  Surgeon: Lennette Bihari, MD;  Location: [facility] INVASIVE CV LAB;  Service: Cardiovascular;  Laterality: N/A;   TEE WITHOUT CARDIOVERSION N/A 03/04/2018   Procedure: TRANSESOPHAGEAL ECHOCARDIOGRAM (TEE);  Surgeon: Donata Clay, Theron Arista, MD;  Location: [facility] OR;  Service: Open Heart Surgery;  Laterality: N/A;     Prior to Admission medications   Medication Sig Start Date End Date Taking? Authorizing Provider  atorvastatin (LIPITOR) 80 MG tablet Take 80 mg by mouth daily.    [provider]  clopidogrel (PLAVIX) 75 MG tablet Take 75 mg by mouth daily.    [provider]  finasteride (PROSCAR) 5 MG tablet Take 5 mg by mouth daily.    [provider]  metFORMIN (GLUCOPHAGE) 500 MG tablet Take 500 mg by mouth 2 (two) times daily with a meal.    [provider]  metoprolol tartrate (LOPRESSOR) 25 MG tablet TAKE 1/2 TABLET BY MOUTH TWICE A DAY 12/15/20   Revankar, Aundra Dubin, MD  nitroGLYCERIN (NITROSTAT) 0.4 MG SL tablet Place 1 tablet (0.4 mg total) under the tongue every 5 (five) minutes x 3 doses as needed for chest pain. 08/18/19   Revankar, Aundra Dubin, MD  ranolazine (RANEXA) 1000 MG SR tablet TAKE ONE TABLET BY MOUTH TWICE A DAY 12/15/20   Revankar, Aundra Dubin, MD  tamsulosin  (FLOMAX) 0.4 MG CAPS capsule Take 0.4 mg by mouth daily. 10/27/19   [provider]    Inpatient Medications: Scheduled Meds:  Continuous Infusions:  PRN Meds:  Allergies:    Allergies  Allergen Reactions   Eggs Or Egg-Derived Products Itching and Rash    Social History:   Social History   Socioeconomic History   Marital status: Married    Spouse name: Not on file   Number of children: Not on file   Years of education: Not on file   Highest education level: Not on file  Occupational History   Not on file  Tobacco Use   Smoking status: Never   Smokeless tobacco: Never  Vaping Use   Vaping Use: Never used  Substance and Sexual Activity   Alcohol use: Not Currently    Comment: 05/20/2018 "stopped before 1999"   Drug use: Never  Sexual activity: Not on file  Other Topics Concern   Not on file  Social History Narrative   Not on file   Social Determinants of Health   Financial Resource Strain: Not on file  Food Insecurity: Not on file  Transportation Needs: Not on file  Physical Activity: Not on file  Stress: Not on file  Social Connections: Not on file  Intimate Partner Violence: Not on file    Family History:   Family History  Problem Relation Age of Onset   Heart disease Mother    Family Status:  Family Status  Relation Name Status   Mother  Deceased at age 57       Heart Attack   Father  Deceased at age 58   ROS:  Please see the history of present illness.  All other ROS reviewed and negative.     Physical Exam/Data:   Vitals:   02/16/21 1215 02/16/21 1230 02/16/21 1245 02/16/21 1300  BP: 130/74 (!) 150/72 (!) 151/82 (!) 162/76  Pulse: (!) 49 (!) 59 (!) 52 (!) 55  Resp: 19 18 13  (!) 22  Temp:      TempSrc:      SpO2: 96% 100% 99% 98%  Weight:      Height:       No intake or output data in the 24 hours ending 02/16/21 1347 Filed Weights   02/16/21 0242  Weight: 61.2 kg   Body mass index is 24.69 kg/m.   General: Well  developed, well nourished, NAD Neck: Negative for carotid bruits. No JVD Lungs:Clear to ausculation bilaterally. No wheezes, rales, or rhonchi. Breathing is unlabored. Cardiovascular: RRR with S1 S2. No murmurs Abdomen: Soft, non-tender, non-distended. No obvious abdominal masses. Extremities: No edema. Neuro: Alert and oriented. No focal deficits. No facial asymmetry. MAE spontaneously. Psych: Responds to questions appropriately with normal affect.    EKG:  The EKG was personally reviewed and demonstrates:  02/16/21 SB with HR 58bpm and non-specific TWI>>present on prior tracings  Telemetry:  Telemetry was personally reviewed and demonstrates:  02/16/21 SB with rates in the 50-60s   Relevant CV Studies:  Cardiac catheterization 03/01/2018:   Prox LAD lesion is 70% stenosed. 1st Diag lesion is 70% stenosed. Ramus lesion is 70% stenosed. Ost 1st Mrg lesion is 95% stenosed. 1st Mrg lesion is 60% stenosed with 60% stenosed side branch in Lat 1st Mrg. Mid RCA lesion is 100% stenosed. LV end diastolic pressure is normal.   1. Severe 3 vessel obstructive CAD    - 70% proximal LAD with abnormal FFR of 0.73.    - 95% proximal large OM1    - 100% mid RCA with left to right collaterals. 2. Normal LVEDP 3. 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 4. Severe ostial left vertebral stenosis.   Plan: Recommend CABG for coronary revascularization. The subclavian stenosis is an issue and may compromise flow in the mammary artery long term. I discussed with Dr. 05/02/2018 and he feels the subclavian stenosis can be stented but would require DAPT. Given patient's unstable coronary presentation it may be best to proceed with CABG. There appears to be enough flow to support using the LIMA as a pedicle graft. 3-4 weeks post op the patient could go on DAPT and stent the left subclavian. Will need to get surgical opinion and co-ordinate with them. Dr. Allyson Sabal would be available for the subclavian  stent procedure.    Cardiac catheterization 04/2018:  Prox LAD lesion  is 70% stenosed. 1st Diag lesion is 80% stenosed. Ost 1st Mrg lesion is 99% stenosed. 1st Mrg lesion is 60% stenosed with 60% stenosed side branch in Lat 1st Mrg. Mid RCA lesion is 100% stenosed. Ramus lesion is 70% stenosed. Origin to Prox Graft lesion is 90% stenosed. Prox Graft to Insertion lesion is 95% stenosed. Origin lesion is 100% stenosed. Origin lesion is 100% stenosed.   Severe three-vessel native coronary obstructive disease with 70% proximal LAD stenosis and 80% stenosis in the first diagonal branch of the LAD.  There was no evidence for competitive flow arising from the LIMA graft.  The left circumflex marginal vessel had 99% stenosis with TIMI II flow.  The mid RCA was totally occluded as was documented previously and there were faint antegrade collaterals as well as significant left to right retrograde collaterals supplying the PDA vessel.   Total occlusion of the free LIMA graft which apparently arose from the roof of the vein graft supplying the marginal vessel.   Severely diseased SVG to the circumflex marginal vessel with 90% proximal stenosis and diffusely atretic mid and distal graft.   Total occlusion of the SVG  which had supplied the diagonal vessel.   Moderately severe LV dysfunction with global ejection fraction at approximately 30% with severe inferior hypocontractility extending to the apex and small focal region of distal anterolateral hypocontractility.  LVEDP increased at 27 mm.   RECOMMENDATION: Angiograms will be reviewed with colleagues and will asked Dr. Morton Peters to review.  There is total occlusion of the graft which had supplied the diagonal and LIMA graft and the graft supplying the marginal vessel is diffusely atretic.  The patient will be started on beta-blocker therapy and nitrates.  Consider the addition of amlodipine and possible ranolazine.  Consider potential stenting of the  native obtuse marginal vessel and perhaps the proximal LAD and diagonal vessels.  Will initiate dual antiplatelet therapy.   Recommend dual antiplatelet therapy with Aspirin  daily and Clopidogrel  daily long-term (beyond 12 months) because of severe disease and early graft closure.   Stent intervention 05/20/2018:  Segmental 70% proximal LAD reduced to 0% with 2.75 x 24 Synergy postdilated to 3.0 mm in diameter and resultant TIMI grade III flow. Total occlusion of the proximal to mid circumflex reduced to 0% with TIMI grade III flow using a 2.75 x 28 mm Synergy postdilated to 3.0 mm in diameter with TIMI grade III flow. Angioplasty of mid 90% stenosis in the first diagonal reduced to less than 50% using a 2.0 x 8 mm balloon.   RECOMMENDATIONS:   Aggressive risk factor modification: A1c less than 7, blood pressure less than 130/80 mmHg, LDL less than 70, moderate aerobic activity. Aspirin and Plavix for 12 months. Consider referral to the CTO team to contemplate RCA territory reeve ask.  Echocardiogram 02/28/2018:   Study Conclusions   - Left ventricle: The cavity size was normal. Wall thickness was   normal. Systolic function was normal. The estimated ejection   fraction was in the range of 60% to 65%. Probable hypokinesis of   the basalinferior myocardium. Indeterminate diastolic function. - Mitral valve: There was mild to moderate regurgitation directed   posteriorly - not well seen. - Right atrium: Central venous pressure (est): 3 mm Hg. - Atrial septum: No defect or patent foramen ovale was identified. - Tricuspid valve: There was trivial regurgitation. - Pulmonary arteries: Systolic pressure could not be accurately   estimated. - Pericardium, extracardiac: There was no pericardial  effusion.    CCTA 05/15/2018:  IMPRESSION: 1. Severe native 3 vessel CAD   2.  Normal healing sternotomy   3.  Normal aortic root 2.9 cm   4. Appears to have pre mature graft failure  with only one of 3 grafts patent. Appers to have patent graft to diagonal. Graft to LAD and OM occluded  CTA Neck (Date: 02/02/12): 1. Approximately 70% stenosis of the proximal left subclavian artery, just proximal to the vertebral artery. 2. High-grade stenoses at the origins of the vertebral arteries bilaterally. 3. Diffuse wall thickening involving the aorta and more prominently in the proximal great vessels, suggesting a nonspecific vasculitis. This is likely the etiology of the left subclavian stenosis. 4. Atherosclerotic irregularity within the carotid bifurcations bilaterally. There is no significant stenosis. 5. Multilevel facet degenerative changes in the cervical spine without significant stenosis.   Based on my review of this patient's CTA, he has a patent L SCA with stenosis >50% in close proximity to the vertebral artery takeoff.  The innominate artery and L CCA are normal in appearance.  Both VA appear equal in size with a L proximal stenosis.  The basilar artery appears widely patent.   Laboratory Data:  Chemistry Recent Labs  Lab 02/16/21 0307  NA 136  K 3.9  CL 107  CO2 23  GLUCOSE 122*  BUN 14  CREATININE 1.00  CALCIUM 9.1  GFRNONAA >60  ANIONGAP 6    Total Protein  Date Value Ref Range Status  02/16/2021 6.6 6.5 - 8.1 g/dL Final  16/05/9603 7.0 6.0 - 8.5 g/dL Final   Albumin  Date Value Ref Range Status  02/16/2021 3.8 3.5 - 5.0 g/dL Final  54/04/8118 4.3 3.8 - 4.8 g/dL Final   AST  Date Value Ref Range Status  02/16/2021 19 15 - 41 U/L Final   ALT  Date Value Ref Range Status  02/16/2021 17 0 - 44 U/L Final   Alkaline Phosphatase  Date Value Ref Range Status  02/16/2021 61 38 - 126 U/L Final   Total Bilirubin  Date Value Ref Range Status  02/16/2021 0.5 0.3 - 1.2 mg/dL Final   Bilirubin Total  Date Value Ref Range Status  11/08/2020 0.4 0.0 - 1.2 mg/dL Final   Hematology Recent Labs  Lab 02/16/21 0307  WBC 8.2  RBC 4.46  HGB 13.3   HCT 41.0  MCV 91.9  MCH 29.8  MCHC 32.4  RDW 13.2  PLT 193   Cardiac EnzymesNo results for input(s): TROPONINI in the last 168 hours. No results for input(s): TROPIPOC in the last 168 hours.  BNPNo results for input(s): BNP, PROBNP in the last 168 hours.  DDimer No results for input(s): DDIMER in the last 168 hours. TSH:  Lab Results  Component Value Date   TSH 1.610 11/08/2020   Lipids: Lab Results  Component Value Date   CHOL 119 11/08/2020   HDL 46 11/08/2020   LDLCALC 51 11/08/2020   TRIG 121 11/08/2020   CHOLHDL 2.6 11/08/2020   HgbA1c: Lab Results  Component Value Date   HGBA1C 6.1 (H) 11/08/2020    Radiology/Studies:  DG Chest 2 View  Result Date: 02/16/2021 CLINICAL DATA:  Chest pain. EXAM: CHEST - 2 VIEW COMPARISON:  Chest x-ray 07/15/2018, CT chest 07/15/2018, chest x-ray 03/04/2018 FINDINGS: The heart size and mediastinal contours are within normal limits. Coronary artery stent. No focal consolidation. No pulmonary edema. No pleural effusion. No pneumothorax. No acute osseous abnormality. IMPRESSION: No active cardiopulmonary disease.  Electronically Signed   By: Tish FredericksonMorgane  Naveau M.D.   On: 02/16/2021 04:21    Assessment and Plan:   1. Chest pain with known CAD s/p CABG/DES/PCI: -Pt with a known hx of CAD s/p CABG x3 (free LIMA to LAD, SVG to OM, SVG to Diag) 02/2018 with early graft failure per cath 04/2018 showing native CTO of LCx and 70% LAD s/p PCI/DES 04/2018 (has residual RCA CTO).  -[facility] with chest pain that began approximately 2am this morning. He stated that he has been in his usual state of health until this episode. He woke to use the restroom and states he started having anterior chest pain that seemed reminiscent of his prior anginal symptoms before bypass surgery. He states that he drank water to see if his symptoms would subside which they did not however by ED arrival, he was chest pain free with no recurrence. He denies any recent chest  pain leading up to yesterday, no SOB, LE edema, palpitations, orthopnea, or syncope. He walks 7 days a week for exercise with no symptoms. He is compliant with his medications. Given his hx of complicated CAD, he presented to the ED for further evaluation.   -HsT was 24>>14 not consistent with ACS.  -CXR with no active cardiopulmonary disease.  -Creatinine, 1.00.  -EKG with 58bpm non-specific TWI however TWI present on prior EKG from 10/2020.  -Per chart review, it appears that the ED PA spoke with Dr. Josiah Loboevenkar who suggested a stress test and if negative, the patient could be discharged home.  -Given significant CAD hx and symptoms similar to prior MI, would err on the side of caution and plan for LHC.  -Continue Plavix, statin, lopressor  -On PTA Ranexa  -Cardiac catheterization was discussed with the patient fully. The patient understands that risks include but are not limited to stroke (1 in 1000), death (1 in 1000), kidney failure [usually temporary] (1 in 500), bleeding (1 in 200), allergic reaction [possibly serious] (1 in 200).  The patient understands and is willing to proceed.     2. HTN: -Stable, 138/82>>142/83>>188/104 -Pt reports that he had not taken his antihypertensives therefore he took his home medication.  -On PTA lopressor 25  3. HLD: -Last LDL, 50 -Continue statin therapy   4. DM2: -Last HbA1c, 6.1 from 10/2020 -On PTA metformin    For questions or updates, please contact [facility] Please consult [email or web address] for contact info under Cardiology/STEMI.   SignedGeorgie Chard, Ellawyn Wogan NP-C [facility] Pager: [phone] 02/16/2021 1:47 PM

## 2021-02-17 ENCOUNTER — Encounter (HOSPITAL_COMMUNITY): Payer: Self-pay | Admitting: Cardiovascular Disease

## 2021-02-17 ENCOUNTER — Observation Stay (HOSPITAL_BASED_OUTPATIENT_CLINIC_OR_DEPARTMENT_OTHER): Payer: Medicare Other

## 2021-02-17 ENCOUNTER — Other Ambulatory Visit (HOSPITAL_COMMUNITY): Payer: Self-pay

## 2021-02-17 ENCOUNTER — Ambulatory Visit (HOSPITAL_COMMUNITY)
Admission: EM | Disposition: A | Discharge status: Home / Self Care | Payer: Self-pay | Source: Home / Self Care | Attending: Emergency Medicine

## 2021-02-17 DIAGNOSIS — Z20822 Contact with and (suspected) exposure to covid-19: Secondary | ICD-10-CM | POA: Diagnosis not present

## 2021-02-17 DIAGNOSIS — E7800 Pure hypercholesterolemia, unspecified: Secondary | ICD-10-CM | POA: Diagnosis not present

## 2021-02-17 DIAGNOSIS — Z951 Presence of aortocoronary bypass graft: Secondary | ICD-10-CM | POA: Diagnosis not present

## 2021-02-17 DIAGNOSIS — I200 Unstable angina: Secondary | ICD-10-CM | POA: Diagnosis not present

## 2021-02-17 DIAGNOSIS — R079 Chest pain, unspecified: Secondary | ICD-10-CM

## 2021-02-17 DIAGNOSIS — I25810 Atherosclerosis of coronary artery bypass graft(s) without angina pectoris: Secondary | ICD-10-CM | POA: Diagnosis not present

## 2021-02-17 DIAGNOSIS — I10 Essential (primary) hypertension: Secondary | ICD-10-CM | POA: Diagnosis not present

## 2021-02-17 DIAGNOSIS — E119 Type 2 diabetes mellitus without complications: Secondary | ICD-10-CM | POA: Diagnosis not present

## 2021-02-17 DIAGNOSIS — I2510 Atherosclerotic heart disease of native coronary artery without angina pectoris: Secondary | ICD-10-CM | POA: Diagnosis not present

## 2021-02-17 HISTORY — PX: LEFT HEART CATH AND CORS/GRAFTS ANGIOGRAPHY: CATH118250

## 2021-02-17 LAB — BASIC METABOLIC PANEL
Anion gap: 7 (ref 5–15)
BUN: 13 mg/dL (ref 8–23)
CO2: 27 mmol/L (ref 22–32)
Calcium: 9.1 mg/dL (ref 8.9–10.3)
Chloride: 105 mmol/L (ref 98–111)
Creatinine, Ser: 0.94 mg/dL (ref 0.61–1.24)
GFR, Estimated: >60 mL/min (ref >60)
Glucose, Bld: 143 mg/dL — ABNORMAL HIGH (ref 70–99)
Potassium: 4.2 mmol/L (ref 3.5–5.1)
Sodium: 139 mmol/L (ref 135–145)

## 2021-02-17 LAB — GLUCOSE, CAPILLARY
Glucose-Capillary: 127 mg/dL — ABNORMAL HIGH (ref 70–99)
Glucose-Capillary: 124 mg/dL — ABNORMAL HIGH (ref 70–99)
Glucose-Capillary: 255 mg/dL — ABNORMAL HIGH (ref 70–99)

## 2021-02-17 LAB — ECHOCARDIOGRAM LIMITED
Height: 62 in
Weight: 2192 oz

## 2021-02-17 LAB — D-DIMER, QUANTITATIVE: D-Dimer, Quant: <0.27 ug/mL-FEU (ref 0.00–0.50)

## 2021-02-17 SURGERY — LEFT HEART CATH AND CORS/GRAFTS ANGIOGRAPHY
Anesthesia: LOCAL

## 2021-02-17 MED ORDER — NITROGLYCERIN 0.4 MG SL SUBL
0.4000 mg | SUBLINGUAL_TABLET | SUBLINGUAL | 2 refills | Status: DC | PRN
  Filled 2021-02-17: qty 25, 8d supply, fill #0

## 2021-02-17 MED ORDER — FENTANYL CITRATE (PF) 100 MCG/2ML IJ SOLN
INTRAMUSCULAR | Status: DC | PRN
  Administered 2021-02-17 (×2): 25 ug via INTRAVENOUS

## 2021-02-17 MED ORDER — FENTANYL CITRATE (PF) 100 MCG/2ML IJ SOLN
INTRAMUSCULAR | Status: AC
  Filled 2021-02-17: qty 2

## 2021-02-17 MED ORDER — HEPARIN (PORCINE) IN NACL 1000-0.9 UT/500ML-% IV SOLN
INTRAVENOUS | Status: AC
  Filled 2021-02-17: qty 1000

## 2021-02-17 MED ORDER — LABETALOL HCL 5 MG/ML IV SOLN: 10.0000 mg | INTRAVENOUS | Status: DC | PRN

## 2021-02-17 MED ORDER — LIDOCAINE HCL (PF) 1 % IJ SOLN
INTRAMUSCULAR | Status: DC | PRN
  Administered 2021-02-17: 14 mL
  Administered 2021-02-17: 2 mL

## 2021-02-17 MED ORDER — SODIUM CHLORIDE 0.9% FLUSH: 3.0000 mL | Freq: Two times a day (BID) | INTRAVENOUS | Status: DC

## 2021-02-17 MED ORDER — SODIUM CHLORIDE 0.9% FLUSH: 3.0000 mL | INTRAVENOUS | Status: DC | PRN

## 2021-02-17 MED ORDER — HYDRALAZINE HCL 20 MG/ML IJ SOLN
10.0000 mg | INTRAMUSCULAR | Status: DC | PRN
Start: 2021-02-17 — End: 2021-02-17

## 2021-02-17 MED ORDER — MIDAZOLAM HCL 2 MG/2ML IJ SOLN
INTRAMUSCULAR | Status: AC
  Filled 2021-02-17: qty 2

## 2021-02-17 MED ORDER — SODIUM CHLORIDE 0.9 % IV SOLN: INTRAVENOUS | Status: AC

## 2021-02-17 MED ORDER — SODIUM CHLORIDE 0.9 % IV SOLN: 250.0000 mL | INTRAVENOUS | Status: DC | PRN

## 2021-02-17 MED ORDER — IOHEXOL 350 MG/ML SOLN
INTRAVENOUS | Status: DC | PRN
  Administered 2021-02-17: 40 mL

## 2021-02-17 MED ORDER — HEPARIN SODIUM (PORCINE) 1000 UNIT/ML IJ SOLN
INTRAMUSCULAR | Status: AC
  Filled 2021-02-17: qty 1

## 2021-02-17 MED ORDER — LOSARTAN POTASSIUM 25 MG PO TABS
25.0000 mg | ORAL_TABLET | Freq: Every day | ORAL | 2 refills | Status: DC
  Filled 2021-02-17: qty 30, 30d supply, fill #0

## 2021-02-17 MED ORDER — HEPARIN (PORCINE) IN NACL 1000-0.9 UT/500ML-% IV SOLN
INTRAVENOUS | Status: DC | PRN
  Administered 2021-02-17 (×2): 500 mL

## 2021-02-17 MED ORDER — MIDAZOLAM HCL 2 MG/2ML IJ SOLN
INTRAMUSCULAR | Status: DC | PRN
  Administered 2021-02-17 (×2): 1 mg via INTRAVENOUS

## 2021-02-17 MED ORDER — ATORVASTATIN CALCIUM 40 MG PO TABS
40.0000 mg | ORAL_TABLET | Freq: Every day | ORAL | 5 refills | Status: DC
  Filled 2021-02-17: qty 30, 30d supply, fill #0

## 2021-02-17 MED ORDER — LIDOCAINE HCL (PF) 1 % IJ SOLN
INTRAMUSCULAR | Status: AC
  Filled 2021-02-17: qty 30

## 2021-02-17 MED ORDER — HEPARIN SODIUM (PORCINE) 5000 UNIT/ML IJ SOLN: 5000.0000 [IU] | Freq: Three times a day (TID) | INTRAMUSCULAR | Status: DC

## 2021-02-17 MED ORDER — VERAPAMIL HCL 2.5 MG/ML IV SOLN
INTRAVENOUS | Status: AC
  Filled 2021-02-17: qty 2

## 2021-02-17 MED ORDER — ACETAMINOPHEN 325 MG PO TABS: 650.0000 mg | ORAL_TABLET | ORAL | Status: DC | PRN

## 2021-02-17 SURGICAL SUPPLY — 14 items
CATH INFINITI 5FR MULTPACK ANG (CATHETERS) ×1 IMPLANT
CLOSURE MYNX CONTROL 5F (Vascular Products) ×1 IMPLANT
GLIDESHEATH SLEND SS 6F .021 (SHEATH) ×1 IMPLANT
GUIDEWIRE INQWIRE 1.5J.035X260 (WIRE) IMPLANT
INQWIRE 1.5J .035X260CM (WIRE) ×2
KIT HEART LEFT (KITS) ×2 IMPLANT
KIT MICROINTRODUCER 5F 7206 (SHEATH) ×1 IMPLANT
PACK CARDIAC CATHETERIZATION (CUSTOM PROCEDURE TRAY) ×2 IMPLANT
SHEATH PINNACLE 5F 10CM (SHEATH) ×1 IMPLANT
SHEATH PROBE COVER 6X72 (BAG) ×1 IMPLANT
SYR MEDRAD MARK 7 150ML (SYRINGE) ×2 IMPLANT
TRANSDUCER W/STOPCOCK (MISCELLANEOUS) ×2 IMPLANT
TUBING CIL FLEX 10 FLL-RA (TUBING) ×2 IMPLANT
WIRE EMERALD 3MM-J .035X150CM (WIRE) ×1 IMPLANT

## 2021-02-17 NOTE — H&P (View-Only) (Signed)
Progress Note  Patient Name: Nicholas [name] Date of Encounter: 02/17/2021  [facility] Cardiologist: Nanetta Batty, MD   Subjective   Denies any further chest pain or SOB.   Inpatient Medications    Scheduled Meds:  aspirin EC  81 mg Oral Daily   atorvastatin  80 mg Oral Daily   clopidogrel  75 mg Oral Daily   heparin  5,000 Units Subcutaneous Q8H   insulin aspart  0-15 Units Subcutaneous TID WC   insulin aspart  0-5 Units Subcutaneous QHS   losartan  25 mg Oral Daily   pneumococcal 23 valent vaccine  0.5 mL Intramuscular Tomorrow-1000   ranolazine  1,000 mg Oral BID   sodium chloride flush  3 mL Intravenous Q12H   Continuous Infusions:  sodium chloride     sodium chloride 1 mL/kg/hr (02/17/21 0614)   PRN Meds: sodium chloride, acetaminophen, nitroGLYCERIN, ondansetron (ZOFRAN) IV, sodium chloride flush   Vital Signs    Vitals:   02/17/21 0400 02/17/21 0435 02/17/21 0520 02/17/21 0600  BP:  (!) 128/59    Pulse:      Resp: 20 18  (!) 22  Temp:  97.7 F (36.5 C)    TempSrc:  Oral    SpO2:      Weight:   62.1 kg   Height:        Intake/Output Summary (Last 24 hours) at 02/17/2021 1001 Last data filed at 02/17/2021 0900 Gross per 24 hour  Intake 240 ml  Output 1825 ml  Net -1585 ml   Last 3 Weights 02/17/2021 02/16/2021 11/08/2020  Weight (lbs) 137 lb 135 lb 145 lb  Weight (kg) 62.143 kg 61.236 kg 65.772 kg      Telemetry    Sinus bradycardia in the 40's - Personally Reviewed  ECG    SB at 48bpm with inferior T wave abnormality and borderline LVH- Personally Reviewed  Physical Exam   GEN: No acute distress.   Neck: No JVD Cardiac: RRR, no murmurs, rubs, or gallops.  Respiratory: Clear to auscultation bilaterally. GI: Soft, nontender, non-distended  MS: No edema; No deformity. Neuro:  Nonfocal  Psych: Normal affect   Labs    High Sensitivity Troponin:   Recent Labs  Lab 02/16/21 0307 02/16/21 0548  TROPONINIHS 24* 14       Chemistry Recent Labs  Lab 02/16/21 0307 02/16/21 1645 02/17/21 0317  NA 136  --  139  K 3.9  --  4.2  CL 107  --  105  CO2 23  --  27  GLUCOSE 122*  --  143*  BUN 14  --  13  CREATININE 1.00 0.95 0.94  CALCIUM 9.1  --  9.1  PROT 6.6  --   --   ALBUMIN 3.8  --   --   AST 19  --   --   ALT 17  --   --   ALKPHOS 61  --   --   BILITOT 0.5  --   --   GFRNONAA >60 >60 >60  ANIONGAP 6  --  7     Hematology Recent Labs  Lab 02/16/21 0307 02/16/21 1645  WBC 8.2 7.3  RBC 4.46 4.79  HGB 13.3 14.3  HCT 41.0 43.6  MCV 91.9 91.0  MCH 29.8 29.9  MCHC 32.4 32.8  RDW 13.2 13.2  PLT 193 199    BNPNo results for input(s): BNP, PROBNP in the last 168 hours.   DDimer No results for input(s): DDIMER  in the last 168 hours.   Radiology    DG Chest 2 View  Result Date: 02/16/2021 CLINICAL DATA:  Chest pain. EXAM: CHEST - 2 VIEW COMPARISON:  Chest x-ray 07/15/2018, CT chest 07/15/2018, chest x-ray 03/04/2018 FINDINGS: The heart size and mediastinal contours are within normal limits. Coronary artery stent. No focal consolidation. No pulmonary edema. No pleural effusion. No pneumothorax. No acute osseous abnormality. IMPRESSION: No active cardiopulmonary disease. Electronically Signed   By: Morgane  Naveau M.D.   On: 02/16/2021 04:21    Cardiac Studies   none  Patient Profile     65 y.o. male with a hx of CAD s/p CABG x3 (free LIMA to LAD, SVG to OM, SVG to Diag) 02/2018 with early graft failure per cath 04/2018 showing native CTO of LCx and 70% LAD s/p PCI/DES 04/2018 (has residual RCA CTO), HTN, HLD, left [place] artery stenosis and DM2 who is being seen today for the evaluation of chest pain at the request of Johana Soto-PA.  Assessment & Plan    1. Chest pain with known CAD s/p CABG/DES/PCI: -Pt with a known hx of CAD s/p CABG x3 (free LIMA to LAD, SVG to OM, SVG to Diag) 02/2018 with early graft failure per cath 04/2018 showing native CTO of LCx and 70% LAD s/p PCI/DES 04/2018 (has  residual RCA CTO). -now admitted with USAP -HsT was 24>>14 not consistent with ACS.  -CXR with no active cardiopulmonary disease.  -EKG with 58bpm non-specific TWI however TWI present on prior EKG from 10/2020.  -he has not had any further CP since admission -given his extensive CAD, recommend LHC to refine coronary anatomy -he is NPO for cath today -Continue Plavix 75mg daily, ASA 81mg daily, high dose statin and Ranexa 1000mg BID   2. HTN: -BP controlled on exam at 128/59mmHg -home lopressor stopped due to bradycardia -Losartan 25mg daily started yesterday -SCr stable today at 0.94 and K+ 4.2   3. HLD: -Last LDL, 50 -Continue atorvastatin 80mg daily   4. DM2: -Last HbA1c, 6.1 from 10/2020 -Metformin on hold for cath  For questions or updates, please contact [facility] Please consult [email or web address] for contact info under        Signed, Deeric Cruise, MD  02/17/2021, 10:01 AM    

## 2021-02-17 NOTE — Discharge Summary (Addendum)
Discharge Summary    Patient ID: Nicholas BignessGurdev Metheny MRN: [id number]; DOB: 1955-10-30  Admit date: 02/16/2021 Discharge date: 02/17/2021  PCP:  Anselmo PicklerAchreja, Manjeet Kaur, MD   [facility] Providers Cardiologist:  Garwin Brothersajan R Revankar, MD   {   Discharge Diagnoses    Principal Problem:   Chest pain Active Problems:   Subclavian arterial stenosis (HCC)   S/P CABG x 3   Dyslipidemia, goal LDL below 70   CAD (coronary artery disease)   HTN (hypertension)   High cholesterol   Type II diabetes mellitus (HCC)    Diagnostic Studies/Procedures    Left Cardiac Catheterization 02/17/2021: Mid RCA lesion is 100% stenosed. Previously placed Prox Cx to Mid Cx stent (unknown type) is widely patent. Dist Cx lesion is 60% stenosed. 3rd Mrg lesion is 60% stenosed. Previously placed Prox LAD to Mid LAD stent (unknown type) is widely patent. SVG graft was not injected. SVG graft was not injected. Origin to Prox Graft lesion is 100% stenosed. Origin to Prox Graft lesion is 100% stenosed.   1. Patent proximal LAD stent with no restenosis. 2. Patent proximal Circumflex stent with no restenosis. Moderate disease in the distal AV groove Circumflex involving a distal obtuse marginal branch, unchanged in appearance from last cath in 2019. 3. 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 The distal vessel fills from right to right bridging collaterals and left to right collaterals. 4. Known occlusion of the Vein graft/free LIMA graft to the obtuse marginal. Known occlusion of the vein graft to the diagonal branch.   Recommendations: Continue medical management of CAD.    Diagnostic Dominance: Right    _____________   History of Present Illness     Nicholas [name] is a 65 y.o. male with a history of CAD s/p CABG x3 (free LIMA-LAD, SVG-OM, SVG-Diag) in 02/2018 with early graft failure on cath in 04/2018 s/p DES to LAD and LCX, left subclavian artery stenosis, hypertension, hyperlipidemia, and type 2 diabetes  mellitus who presented on 02/16/2021 with chest pain.   Patient initially diagnosed with CAD in 02/2018 when he presented with NSTEMI. Cardiac cath showed severe 3-vessel CAD. Echo at that time showed normal LV function. He ultimately underwent CABG x3 with free LIMA, SVG to OM, and SVG to Diag on 03/04/2018. Initially did well with unremarkable post-op course. He presented back to the ED in 04/2018 with chest pain and ruled in for NSTEMI again. Cardiac cath showed premature graft failure with occlusion of all grafts. He underwent successful PCI with DES to LAD as well as PCI with DES to CTO of LCX. He had residual CTO of RCA with collaterals. No further ischemic work-up since that time. Antianginals include Ranexa and Imdur.  Patient was in his usual state of health until 02/16/2021 when he woke up to use the restroom around 2am and started having anterior chest pain that seemed reminiscent of his prior anginal symptoms before bypass surgery. Patient tried drinking water to see if that would help but with no relief. He denied any recent chest pain leading up to this. No shortness of breath, orthopnea, lower extremity edema, palpitations, or syncope. He walks 7 days a week  for exercise with no symptoms. He is compliant with all his medications. Given history of CAD, he presented to the ED for further evaluation. However, by the time he arrived in the ED he was chest pain free.  Hospital Course     Consultants: None  Chest Pain  History of CAD s/p CABG  with Early Graft Failure Patient was admitted for chest pain as stated above. Initial high-sensitivity minimal elevated at 24 but repeat negative at 14. Not consistent with ACS. Given history of complicated CAD, decision was made to proceed with cardiac catheterization for definitive evaluation. Patient underwent LHC on 02/17/2021 which was unchanged from last cath. Prior stents in LAD and LCX patent. Continued medical therapy was recommended. Patient tolerated  procedure well. Mynx closure used. Right femoral cath site soft and and stable. Echo showed LVEF of 55-60% with normal wall motion. D-dimer negative. Felt to be stable for discharge. Continue Plavix 75mg  daily (Dr. discontinued Aspirin as last visit in 10/2020). Continue home Ranexa 1,000mg  twice daily and high-intensity statin. Home beta-blocker stopped due to baseline bradycardia.   Hypertension BP elevated intermittent. Will stop Lopressor due to baseline bradycardia. Started Losartan 25mg  daily. Will need repeat BMET in 1-2 weeks.   Hyperlipidemia LDL 51 in 10/2020. Lipitor 80mg  listed under PTA medications. However, patient states he had constipation with this dose. He states he has been taking Lipitor 40mg  for over 1 year. LDL at goal so OK to continue this dose.  Type 2 Diabetes Mellitus Hemoglobin A1c 6.1 in 10/2020. Placed on sliding scale insulin during admission. Will restart home medications on discharge. Can restart Metformin 48 hours after cardiac catheterization.  Patient seen and examined by Dr. 11/2020 and determined to be stable for discharge. Will help arrange outpatient follow-up. Medications as below.   Did the patient have an acute coronary syndrome (MI, NSTEMI, STEMI, etc) this admission?:  No                               Did the patient have a percutaneous coronary intervention (stent / angioplasty)?:  No.       _____________  Discharge Vitals Blood pressure 139/65, pulse (!) 55, temperature 98.3 F (36.8 C), temperature source Oral, resp. rate 20, height 5\' 2"  (1.575 m), weight 62.1 kg, SpO2 100 %.  Filed Weights   02/16/21 0242 02/17/21 0520  Weight: 61.2 kg 62.1 kg    Labs & Radiologic Studies    CBC Recent Labs    02/16/21 0307 02/16/21 1645  WBC 8.2 7.3  HGB 13.3 14.3  HCT 41.0 43.6  MCV 91.9 91.0  PLT 193 199   Basic Metabolic Panel Recent Labs    0307 02/16/21 1645 02/17/21 0317  NA 136  --  139  K 3.9  --  4.2  CL 107  --   105  CO2 23  --  27  GLUCOSE 122*  --  143*  BUN 14  --  13  CREATININE 1.00 0.95 0.94  CALCIUM 9.1  --  9.1   Liver Function Tests Recent Labs    02/16/21 0307  AST 19  ALT 17  ALKPHOS 61  BILITOT 0.5  PROT 6.6  ALBUMIN 3.8   No results for input(s): LIPASE, AMYLASE in the last 72 hours. High Sensitivity Troponin:   Recent Labs  Lab 02/16/21 0307 02/16/21 0548  TROPONINIHS 24* 14    BNP Invalid input(s): POCBNP D-Dimer Recent Labs    02/17/21 1844  DDIMER <0.27   Hemoglobin A1C No results for input(s): HGBA1C in the last 72 hours. Fasting Lipid Panel No results for input(s): CHOL, HDL, LDLCALC, TRIG, CHOLHDL, LDLDIRECT in the last 72 hours. Thyroid Function Tests No results for input(s): TSH, T4TOTAL, T3FREE, THYROIDAB in the last 72  hours.  Invalid input(s): FREET3 _____________  DG Chest 2 View  Result Date: 02/16/2021 CLINICAL DATA:  Chest pain. EXAM: CHEST - 2 VIEW COMPARISON:  Chest x-ray 07/15/2018, CT chest 07/15/2018, chest x-ray 03/04/2018 FINDINGS: The heart size and mediastinal contours are within normal limits. Coronary artery stent. No focal consolidation. No pulmonary edema. No pleural effusion. No pneumothorax. No acute osseous abnormality. IMPRESSION: No active cardiopulmonary disease. Electronically Signed   By: Tish Frederickson M.D.   On: 02/16/2021 04:21   CARDIAC CATHETERIZATION  Result Date: 02/17/2021  Mid RCA lesion is 100% stenosed.  Previously placed Prox Cx to Mid Cx stent (unknown type) is widely patent.  Dist Cx lesion is 60% stenosed.  3rd Mrg lesion is 60% stenosed.  Previously placed Prox LAD to Mid LAD stent (unknown type) is widely patent.  SVG graft was not injected.  SVG graft was not injected.  Origin to Prox Graft lesion is 100% stenosed.  Origin to Prox Graft lesion is 100% stenosed.  1. Patent proximal LAD stent with no restenosis. 2. Patent proximal Circumflex stent with no restenosis. Moderate disease in the distal AV  groove Circumflex involving a distal obtuse marginal branch, unchanged in appearance from last cath in 2019. 3. 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 The distal vessel fills from right to right bridging collaterals and left to right collaterals. 4. Known occlusion of the Vein graft/free LIMA graft to the obtuse marginal. Known occlusion of the vein graft to the diagonal branch. Recommendations: Continue medical management of CAD.    ECHOCARDIOGRAM LIMITED  Result Date: 02/17/2021    ECHOCARDIOGRAM LIMITED REPORT   Patient Name:   [facility] Date of Exam: 02/17/2021 Medical Rec #:  [id number]    Height:       62.0 in Accession #:    [id number]   Weight:       137.0 lb Date of Birth:  Jul 22, 1956    BSA:          1.628 m Patient Age:    65 years     BP:           139/65 mmHg Patient Gender: M            HR:           55 bpm. Exam Location:  Inpatient Procedure: 2D Echo, Limited Echo and 3D Echo Indications:    R07.9* Chest pain, unspecified  History:        Patient has prior history of Echocardiogram examinations, most                 recent 07/03/2018. CAD; Prior CABG.  Sonographer:    Roosvelt Maser RDCS Referring Phys: [phone] TRACI R TURNER IMPRESSIONS  1. Limited stat echo for pericardial effusion. No pericardial effusion present. Grossly normal LV/RV function. No color doppler of the valves performed. IVC is collapsing.  2. Left ventricular ejection fraction, by estimation, is 55 to 60%. The left ventricle has normal function. The left ventricle has no regional wall motion abnormalities.  3. Right ventricular systolic function is normal. The right ventricular size is normal.  4. The inferior vena cava is normal in size with greater than 50% respiratory variability, suggesting right atrial pressure of 3 mmHg. FINDINGS  Left Ventricle: Left ventricular ejection fraction, by estimation, is 55 to 60%. The left ventricle has normal function. The left ventricle has no regional wall motion abnormalities. Abnormal  (paradoxical) septal motion consistent with post-operative status. Right Ventricle: The right  ventricular size is normal. No increase in right ventricular wall thickness. Right ventricular systolic function is normal. Left Atrium: Left atrial size was normal in size. Right Atrium: Right atrial size was normal in size. Pericardium: There is no evidence of pericardial effusion. Aorta: The aortic root is normal in size and structure. Venous: The inferior vena cava is normal in size with greater than 50% respiratory variability, suggesting right atrial pressure of 3 mmHg. IAS/Shunts: The atrial septum is grossly normal.  3D Volume EF: 3D EF:        42 % LV EDV:       141 ml LV ESV:       82 ml LV SV:        60 ml Lennie Odor MD Electronically signed by Lennie Odor MD Signature Date/Time: 02/17/2021/6:06:49 PM    Final    Disposition   Patient is being discharged home today in good condition.  Follow-up Plans & Appointments     Follow-up Information     [facility] Follow up.   Specialty: Cardiology Why: Please come by the office in 1 week for a lab visit so we can recheck your kidney function and electrolytes. Lab is open from 8:00am to 4:30pm. Contact information: [address], [address] [place] [address] [phone]        Revankar, Aundra Dubin, MD Follow up.   Specialty: Cardiology Why: Our office will call you to schedule a hospital follow-up visit. Contact information: [address] [place]  [place] [address] [phone]                Discharge Instructions     Call MD for:  redness, tenderness, or signs of infection (pain, swelling, redness, odor or green/yellow discharge around incision site)   Complete by: As directed    Diet - low sodium heart healthy   Complete by: As directed    Increase activity slowly   Complete by: As directed    Increase activity slowly   Complete by: As directed        Discharge  Medications   Allergies as of 02/17/2021       Reactions   Eggs Or Egg-derived Products Itching, Rash   Latex Rash        Medication List     STOP taking these medications    diclofenac 50 MG tablet Commonly known as: CATAFLAM   metoprolol tartrate 25 MG tablet Commonly known as: LOPRESSOR   sulfamethoxazole-trimethoprim 800-160 MG tablet Commonly known as: BACTRIM DS       TAKE these medications    atorvastatin 40 MG tablet Commonly known as: Lipitor Take 1 tablet (40 mg total) by mouth daily. What changed:  medication strength when to take this   clopidogrel 75 MG tablet Commonly known as: PLAVIX Take 75 mg by mouth daily.   finasteride 5 MG tablet Commonly known as: PROSCAR Take 5 mg by mouth daily.   losartan 25 MG tablet Commonly known as: COZAAR Take 1 tablet (25 mg total) by mouth daily. Start taking on: February 18, 2021   metFORMIN 500 MG tablet Commonly known as: GLUCOPHAGE Take 500 mg by mouth 2 (two) times daily with a meal.   NEOMYCIN-POLYMYXIN-HYDROCORTISONE 1 % Soln OTIC solution Commonly known as: CORTISPORIN 2 drops See admin instructions. Instill 2 drops into the affected ear twice a day as directed   nitroGLYCERIN 0.4 MG SL tablet Commonly known as: NITROSTAT Place  0.4 mg under the tongue every 5 (five) minutes as needed for chest pain.   nitroGLYCERIN 0.4 MG SL tablet Commonly known as: NITROSTAT Place 1 tablet (0.4 mg total) under the tongue every 5 (five) minutes x 3 doses as needed for chest pain.   ranolazine 1000 MG SR tablet Commonly known as: RANEXA TAKE ONE TABLET BY MOUTH TWICE A DAY            Outstanding Labs/Studies   Repeat BMET in 1-2 weeks.  Duration of Discharge Encounter   Greater than 30 minutes including physician time.  Signed, Theodore Demark, PA-C 02/17/2021, 7:41 PM

## 2021-02-17 NOTE — Care Management Obs Status (Signed)
MEDICARE OBSERVATION STATUS NOTIFICATION   Patient Details  Name: Nicholas [name] MRN: [id number] Date of Birth: [birth year]   Medicare Observation Status Notification Given:  Yes    Leone Haven, RN 02/17/2021, 5:08 PM

## 2021-02-17 NOTE — Progress Notes (Addendum)
Cardiac Cath findings reviewed from today showing no change from prior cath and medical management recommended.  Will continue home meds and followup with Dr. Tomie China as outpt.  OK to d/c home after bedrest completed.  Discharge meds as below:  Stop lopressor due to bradycardia  Continue: Plavix 75mg  daily Ranexa 1000mg  BID Atorvastatin 40mg  daily Losartan 25mg  daily  Will get stat limited echo to make sure LVF is normal and no Pericardial effusion prior to discharge.  Check DDimer.  If normal ok to discharge home after bedrest.

## 2021-02-17 NOTE — Care Management CC44 (Signed)
Condition Code 44 Documentation Completed  Patient Details  Name: Nicholas [name] MRN: [id number] Date of Birth: 1955-12-26   Condition Code 44 given:  Yes Patient signature on Condition Code 44 notice:  Yes Documentation of 2 MD's agreement:  Yes Code 44 added to claim:       Leone Haven, RN 02/17/2021, 5:08 PM

## 2021-02-17 NOTE — Progress Notes (Signed)
  Echocardiogram 2D Echocardiogram has been performed.  Nicholas [name] 02/17/2021, 5:49 PM

## 2021-02-17 NOTE — Progress Notes (Signed)
Pt continues to refuse Heparin subcutaneous injections.

## 2021-02-17 NOTE — Interval H&P Note (Signed)
History and Physical Interval Note:  02/17/2021 1:04 PM  Carollee Massed Latterell  has presented today for surgery, with the diagnosis of chest pain.  The various methods of treatment have been discussed with the patient and family. After consideration of risks, benefits and other options for treatment, the patient has consented to  Procedure(s): LEFT HEART CATH AND CORS/GRAFTS ANGIOGRAPHY (N/A) as a surgical intervention.  The patient's history has been reviewed, patient examined, no change in status, stable for surgery.  I have reviewed the patient's chart and labs.  Questions were answered to the patient's satisfaction.    Cath Lab Visit (complete for each Cath Lab visit)  Clinical Evaluation Leading to the Procedure:   ACS: No.  Non-ACS:    Anginal Classification: CCS III  Anti-ischemic medical therapy: Maximal Therapy (2 or more classes of medications)  Non-Invasive Test Results: No non-invasive testing performed  Prior CABG: Previous CABG        Verne Carrow

## 2021-02-17 NOTE — Progress Notes (Signed)
Progress Note  Patient Name: Nicholas [name] Date of Encounter: 02/17/2021  [facility] Cardiologist: Nanetta Batty, MD   Subjective   Denies any further chest pain or SOB.   Inpatient Medications    Scheduled Meds:  aspirin EC  81 mg Oral Daily   atorvastatin  80 mg Oral Daily   clopidogrel  75 mg Oral Daily   heparin  5,000 Units Subcutaneous Q8H   insulin aspart  0-15 Units Subcutaneous TID WC   insulin aspart  0-5 Units Subcutaneous QHS   losartan  25 mg Oral Daily   pneumococcal 23 valent vaccine  0.5 mL Intramuscular Tomorrow-1000   ranolazine  1,000 mg Oral BID   sodium chloride flush  3 mL Intravenous Q12H   Continuous Infusions:  sodium chloride     sodium chloride 1 mL/kg/hr (02/17/21 0614)   PRN Meds: sodium chloride, acetaminophen, nitroGLYCERIN, ondansetron (ZOFRAN) IV, sodium chloride flush   Vital Signs    Vitals:   02/17/21 0400 02/17/21 0435 02/17/21 0520 02/17/21 0600  BP:  (!) 128/59    Pulse:      Resp: 20 18  (!) 22  Temp:  97.7 F (36.5 C)    TempSrc:  Oral    SpO2:      Weight:   62.1 kg   Height:        Intake/Output Summary (Last 24 hours) at 02/17/2021 1001 Last data filed at 02/17/2021 0900 Gross per 24 hour  Intake 240 ml  Output 1825 ml  Net -1585 ml   Last 3 Weights 02/17/2021 02/16/2021 11/08/2020  Weight (lbs) 137 lb 135 lb 145 lb  Weight (kg) 62.143 kg 61.236 kg 65.772 kg      Telemetry    Sinus bradycardia in the 40's - Personally Reviewed  ECG    SB at 48bpm with inferior T wave abnormality and borderline LVH- Personally Reviewed  Physical Exam   GEN: No acute distress.   Neck: No JVD Cardiac: RRR, no murmurs, rubs, or gallops.  Respiratory: Clear to auscultation bilaterally. GI: Soft, nontender, non-distended  MS: No edema; No deformity. Neuro:  Nonfocal  Psych: Normal affect   Labs    High Sensitivity Troponin:   Recent Labs  Lab 02/16/21 0307 02/16/21 0548  TROPONINIHS 24* 14       Chemistry Recent Labs  Lab 02/16/21 0307 02/16/21 1645 02/17/21 0317  NA 136  --  139  K 3.9  --  4.2  CL 107  --  105  CO2 23  --  27  GLUCOSE 122*  --  143*  BUN 14  --  13  CREATININE 1.00 0.95 0.94  CALCIUM 9.1  --  9.1  PROT 6.6  --   --   ALBUMIN 3.8  --   --   AST 19  --   --   ALT 17  --   --   ALKPHOS 61  --   --   BILITOT 0.5  --   --   GFRNONAA >60 >60 >60  ANIONGAP 6  --  7     Hematology Recent Labs  Lab 02/16/21 0307 02/16/21 1645  WBC 8.2 7.3  RBC 4.46 4.79  HGB 13.3 14.3  HCT 41.0 43.6  MCV 91.9 91.0  MCH 29.8 29.9  MCHC 32.4 32.8  RDW 13.2 13.2  PLT 193 199    BNPNo results for input(s): BNP, PROBNP in the last 168 hours.   DDimer No results for input(s): DDIMER  in the last 168 hours.   Radiology    DG Chest 2 View  Result Date: 02/16/2021 CLINICAL DATA:  Chest pain. EXAM: CHEST - 2 VIEW COMPARISON:  Chest x-ray 07/15/2018, CT chest 07/15/2018, chest x-ray 03/04/2018 FINDINGS: The heart size and mediastinal contours are within normal limits. Coronary artery stent. No focal consolidation. No pulmonary edema. No pleural effusion. No pneumothorax. No acute osseous abnormality. IMPRESSION: No active cardiopulmonary disease. Electronically Signed   By: Tish Frederickson M.D.   On: 02/16/2021 04:21    Cardiac Studies   none  Patient Profile     65 y.o. male with a hx of CAD s/p CABG x3 (free LIMA to LAD, SVG to OM, SVG to Diag) 02/2018 with early graft failure per cath 04/2018 showing native CTO of LCx and 70% LAD s/p PCI/DES 04/2018 (has residual RCA CTO), HTN, HLD, left [place] artery stenosis and DM2 who is being seen today for the evaluation of chest pain at the request of Johana Soto-PA.  Assessment & Plan    1. Chest pain with known CAD s/p CABG/DES/PCI: -Pt with a known hx of CAD s/p CABG x3 (free LIMA to LAD, SVG to OM, SVG to Diag) 02/2018 with early graft failure per cath 04/2018 showing native CTO of LCx and 70% LAD s/p PCI/DES 04/2018 (has  residual RCA CTO). -now admitted with USAP -HsT was 24>>14 not consistent with ACS.  -CXR with no active cardiopulmonary disease.  -EKG with 58bpm non-specific TWI however TWI present on prior EKG from 10/2020.  -he has not had any further CP since admission -given his extensive CAD, recommend LHC to refine coronary anatomy -he is NPO for cath today -Continue Plavix 75mg  daily, ASA 81mg  daily, high dose statin and Ranexa 1000mg  BID   2. HTN: -BP controlled on exam at 128/61mmHg -home lopressor stopped due to bradycardia -Losartan 25mg  daily started yesterday -SCr stable today at 0.94 and K+ 4.2   3. HLD: -Last LDL, 50 -Continue atorvastatin 80mg  daily   4. DM2: -Last HbA1c, 6.1 from 10/2020 -Metformin on hold for cath  For questions or updates, please contact [facility] Please consult [email or web address] for contact info under        Signed, , MD  02/17/2021, 10:01 AM

## 2021-02-18 MED FILL — Verapamil HCl IV Soln 2.5 MG/ML: INTRAVENOUS | Qty: 2 | Status: AC

## 2021-02-18 MED FILL — Heparin Sodium (Porcine) Inj 1000 Unit/ML: INTRAMUSCULAR | Qty: 10 | Status: AC

## 2021-03-18 ENCOUNTER — Telehealth: Payer: Self-pay

## 2021-03-18 MED ORDER — LOSARTAN POTASSIUM 25 MG PO TABS: 25.0000 mg | ORAL_TABLET | Freq: Every day | ORAL | 2 refills | Status: DC

## 2021-03-18 NOTE — Telephone Encounter (Signed)
Patient contacted the office to request a ninety refill on his Losartan to be sent to [facility].   Reviewed chart.   Rx(s) sent to pharmacy electronically.  Patient voiced understanding.

## 2021-03-25 ENCOUNTER — Other Ambulatory Visit: Payer: Self-pay

## 2021-03-25 ENCOUNTER — Ambulatory Visit: Payer: Medicare Other | Admitting: Cardiology

## 2021-03-25 ENCOUNTER — Encounter: Payer: Self-pay | Admitting: Cardiology

## 2021-03-25 VITALS — BP 126/78 | HR 62 | Ht 62.0 in | Wt 135.1 lb

## 2021-03-25 DIAGNOSIS — Z951 Presence of aortocoronary bypass graft: Secondary | ICD-10-CM

## 2021-03-25 DIAGNOSIS — I10 Essential (primary) hypertension: Secondary | ICD-10-CM

## 2021-03-25 DIAGNOSIS — Z1329 Encounter for screening for other suspected endocrine disorder: Secondary | ICD-10-CM

## 2021-03-25 DIAGNOSIS — I2510 Atherosclerotic heart disease of native coronary artery without angina pectoris: Secondary | ICD-10-CM | POA: Diagnosis not present

## 2021-03-25 DIAGNOSIS — E088 Diabetes mellitus due to underlying condition with unspecified complications: Secondary | ICD-10-CM | POA: Diagnosis not present

## 2021-03-25 NOTE — Patient Instructions (Signed)

## 2021-03-25 NOTE — Progress Notes (Signed)
Cardiology Office Note:    Date:  03/25/2021   ID:  Nicholas [name], DOB [birth year], MRN [id number]  PCP:  Anselmo Pickler, MD  Cardiologist:  Garwin Brothers, MD   Referring MD: Anselmo Pickler, *    ASSESSMENT:    1. Coronary artery disease involving native coronary artery of native heart without angina pectoris   2. Primary hypertension   3. S/P CABG x 3   4. Diabetes mellitus due to underlying condition with unspecified complications (HCC)    PLAN:    In order of problems listed above:  Coronary artery disease: Secondary prevention stressed with the patient.  Importance of compliance with diet medication stressed any vocalized understanding.  He has excellent effort tolerance and he will continue to exercise regularly. Essential hypertension: Blood pressure stable and diet was emphasized.  Lifestyle modification urged. Mixed dyslipidemia: Diet was emphasized.  Lipids are fine and he will continue statins. Diabetes mellitus: Managed by primary care doctor.  Diet stressed. Patient will be seen in follow-up appointment in 6 months or earlier if the patient has any concerns    Medication Adjustments/Labs and Tests Ordered: Current medicines are reviewed at length with the patient today.  Concerns regarding medicines are outlined above.  No orders of the defined types were placed in this encounter.  No orders of the defined types were placed in this encounter.    No chief complaint on file.    History of Present Illness:    Nicholas [name] is a 65 y.o. male.  Patient has past medical history of coronary artery disease post CABG surgery, essential hypertension dyslipidemia and diabetes mellitus.  He denies any problems at this time and takes care of activities of daily living.  No chest pain orthopnea or PND.  At the time of my evaluation, the patient is alert awake oriented and in no distress.  He walks half an hour a day on a daily basis.  Past Medical History:   Diagnosis Date   CAD (coronary artery disease) 07/15/2018   CAD S/P percutaneous coronary angioplasty 05/29/2018   S/P native CTO CFX and 70% LAD PCI with DES 05/16/18 after early occlusion of his grafts   Carotid stenosis 02/21/2019   Chest pain 02/16/2021   Chronic systolic (congestive) heart failure (HCC) 07/15/2018   Coronary artery disease    Diabetes mellitus due to underlying condition with unspecified complications (HCC) 05/31/2018   Dyslipidemia, goal LDL below 70 03/27/2018   Hyperlipidemia   Heart murmur    High cholesterol    HTN (hypertension) 07/15/2018   Hypertension    Non-insulin dependent type 2 diabetes mellitus (HCC) 03/27/2018   PONV (postoperative nausea and vomiting)    Pulmonary nodules 07/15/2018   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   03/04/18- Coronary artery bypass grafting x3 (free left internal mammary artery graft to left anterior descending, saphenous vein graft to obtuse marginal, saphenous vein graft to diagonal. Normal LVF pre op     Stricture of artery (HCC) 02/02/2012   Subclavian arterial stenosis (HCC) 01/19/2012   Left subclavian stenosis 75% with pressure gradient of 60 mm Hg at cath   Syncope 07/15/2018   Type II diabetes mellitus (HCC)     Past Surgical History:  Procedure Laterality Date   CATARACT EXTRACTION W/ INTRAOCULAR LENS  IMPLANT, BILATERAL Bilateral 2012-2017   CORONARY ARTERY BYPASS GRAFT N/A 03/04/2018   Procedure: CORONARY ARTERY BYPASS GRAFTING (CABG) x 3: -FREE LIMA to LAD, -SVG to DIAGONAL, -SVG to OM;  ENDOSCOPIC HARVEST GREATER SAPHENOUS VEIN: -Right Thigh  ;  Surgeon: Kerin Perna, MD;  Location: [facility] OR;  Service: Open Heart Surgery;  Laterality: N/A;   CORONARY STENT INTERVENTION N/A 05/20/2018   Procedure: CORONARY STENT INTERVENTION;  Surgeon: Lyn Records, MD;  Location: [facility] INVASIVE CV LAB;  Service: Cardiovascular;  Laterality: N/A;   EYE SURGERY     INTRAVASCULAR PRESSURE WIRE/FFR STUDY N/A 03/01/2018   Procedure: INTRAVASCULAR  PRESSURE WIRE/FFR STUDY;  Surgeon: Swaziland, Peter M, MD;  Location[facility] INVASIVE CV LAB;  Service: Cardiovascular;  Laterality: N/A;   LEFT HEART CATH AND CORONARY ANGIOGRAPHY N/A 03/01/2018   Procedure: LEFT HEART CATH AND CORONARY ANGIOGRAPHY;  Surgeon: Swaziland, Peter M, MD;  Location: [facility] INVASIVE CV LAB;  Service: Cardiovascular;  Laterality: N/A;   LEFT HEART CATH AND CORS/GRAFTS ANGIOGRAPHY N/A 05/16/2018   Procedure: LEFT HEART CATH AND CORS/GRAFTS ANGIOGRAPHY;  Surgeon: Lennette Bihari, MD;  Location: [facility] INVASIVE CV LAB;  Service: Cardiovascular;  Laterality: N/A;   LEFT HEART CATH AND CORS/GRAFTS ANGIOGRAPHY N/A 02/17/2021   Procedure: LEFT HEART CATH AND CORS/GRAFTS ANGIOGRAPHY;  Surgeon: Kathleene Hazel, MD;  Location: [facility] INVASIVE CV LAB;  Service: Cardiovascular;  Laterality: N/A;   TEE WITHOUT CARDIOVERSION N/A 03/04/2018   Procedure: TRANSESOPHAGEAL ECHOCARDIOGRAM (TEE);  Surgeon: Donata Clay, Theron Arista, MD;  Location: [facility] OR;  Service: Open Heart Surgery;  Laterality: N/A;    Current Medications: Current Meds  Medication Sig   atorvastatin (LIPITOR) 40 MG tablet Take 1 tablet (40 mg total) by mouth daily.   clopidogrel (PLAVIX) 75 MG tablet Take 75 mg by mouth daily.   finasteride (PROSCAR) 5 MG tablet Take 5 mg by mouth daily.   losartan (COZAAR) 25 MG tablet Take 12.5 mg by mouth in the morning and at bedtime.   metFORMIN (GLUCOPHAGE) 500 MG tablet Take 500 mg by mouth 2 (two) times daily with a meal.   nitroGLYCERIN (NITROSTAT) 0.4 MG SL tablet Place 1 tablet (0.4 mg total) under the tongue every 5 (five) minutes x 3 doses as needed for chest pain.   ranolazine (RANEXA) 1000 MG SR tablet TAKE ONE TABLET BY MOUTH TWICE A DAY     Allergies:   Eggs or egg-derived products and Latex   Social History   Socioeconomic History   Marital status: Married    Spouse name: Not on file   Number of children: Not on file   Years of education: Not on file   Highest education level: Not on  file  Occupational History   Not on file  Tobacco Use   Smoking status: Never   Smokeless tobacco: Never  Vaping Use   Vaping Use: Never used  Substance and Sexual Activity   Alcohol use: Not Currently    Comment: 05/20/2018 "stopped before 1999"   Drug use: Never   Sexual activity: Not on file  Other Topics Concern   Not on file  Social History Narrative   Not on file   Social Determinants of Health   Financial Resource Strain: Not on file  Food Insecurity: Not on file  Transportation Needs: Not on file  Physical Activity: Not on file  Stress: Not on file  Social Connections: Not on file     Family History: The patient's family history includes Heart disease in his mother.  ROS:   Please see the history of present illness.    All other systems reviewed and are negative.  EKGs/Labs/Other Studies Reviewed:    The  following studies were reviewed today: I discussed my findings with the patient at length.   Recent Labs: 11/08/2020: TSH 1.610 02/16/2021: ALT 17; Hemoglobin 14.3; Platelets 199 02/17/2021: BUN 13; Creatinine, Ser 0.94; Potassium 4.2; Sodium 139  Recent Lipid Panel    Component Value Date/Time   CHOL 119 11/08/2020 0941   TRIG 121 11/08/2020 0941   HDL 46 11/08/2020 0941   CHOLHDL 2.6 11/08/2020 0941   CHOLHDL 6.1 02/28/2018 0346   VLDL 55 (H) 02/28/2018 0346   LDLCALC 51 11/08/2020 0941    Physical Exam:    VS:  BP 126/78   Pulse 62   Ht 5\' 2"  (1.575 m)   Wt 135 lb 1.9 oz (61.3 kg)   SpO2 97%   BMI 24.71 kg/m     Wt Readings from Last 3 Encounters:  03/25/21 135 lb 1.9 oz (61.3 kg)  02/17/21 137 lb (62.1 kg)  11/08/20 145 lb (65.8 kg)     GEN: Patient is in no acute distress HEENT: Normal NECK: No JVD; No carotid bruits LYMPHATICS: No lymphadenopathy CARDIAC: Hear sounds regular, 2/6 systolic murmur at the apex. RESPIRATORY:  Clear to auscultation without rales, wheezing or rhonchi  ABDOMEN: Soft, non-tender,  non-distended MUSCULOSKELETAL:  No edema; No deformity  SKIN: Warm and dry NEUROLOGIC:  Alert and oriented x 3 PSYCHIATRIC:  Normal affect   Signed, 11/10/20, MD  03/25/2021 8:28 AM    [facility]

## 2021-03-26 LAB — HEPATIC FUNCTION PANEL
ALT: 16 IU/L (ref 0–44)
AST: 18 IU/L (ref 0–40)
Albumin: 4.4 g/dL (ref 3.8–4.8)
Alkaline Phosphatase: 70 IU/L (ref 44–121)
Bilirubin Total: 0.5 mg/dL (ref 0.0–1.2)
Bilirubin, Direct: 0.17 mg/dL (ref 0.00–0.40)
Total Protein: 6.9 g/dL (ref 6.0–8.5)

## 2021-03-26 LAB — CBC WITH DIFFERENTIAL/PLATELET
Basophils Absolute: 0.1 10*3/uL (ref 0.0–0.2)
Basos: 1 %
EOS (ABSOLUTE): 0.2 10*3/uL (ref 0.0–0.4)
Eos: 3 %
Hematocrit: 41.4 % (ref 37.5–51.0)
Hemoglobin: 13.7 g/dL (ref 13.0–17.7)
Immature Grans (Abs): 0 10*3/uL (ref 0.0–0.1)
Immature Granulocytes: 0 %
Lymphocytes Absolute: 2.6 10*3/uL (ref 0.7–3.1)
Lymphs: 38 %
MCH: 29.7 pg (ref 26.6–33.0)
MCHC: 33.1 g/dL (ref 31.5–35.7)
MCV: 90 fL (ref 79–97)
Monocytes Absolute: 0.5 10*3/uL (ref 0.1–0.9)
Monocytes: 7 %
Neutrophils Absolute: 3.5 10*3/uL (ref 1.4–7.0)
Neutrophils: 51 %
Platelets: 192 10*3/uL (ref 150–450)
RBC: 4.61 x10E6/uL (ref 4.14–5.80)
RDW: 12.9 % (ref 11.6–15.4)
WBC: 6.8 10*3/uL (ref 3.4–10.8)

## 2021-03-26 LAB — TSH: TSH: 1.82 u[IU]/mL (ref 0.450–4.500)

## 2021-03-26 LAB — BASIC METABOLIC PANEL
BUN/Creatinine Ratio: 13 (ref 10–24)
BUN: 12 mg/dL (ref 8–27)
CO2: 21 mmol/L (ref 20–29)
Calcium: 9.6 mg/dL (ref 8.6–10.2)
Chloride: 103 mmol/L (ref 96–106)
Creatinine, Ser: 0.93 mg/dL (ref 0.76–1.27)
Glucose: 120 mg/dL — ABNORMAL HIGH (ref 65–99)
Potassium: 4.5 mmol/L (ref 3.5–5.2)
Sodium: 139 mmol/L (ref 134–144)
eGFR: 90 mL/min/{1.73_m2} (ref >59)

## 2021-03-26 LAB — LIPID PANEL
Chol/HDL Ratio: 2.7 ratio (ref 0.0–5.0)
Cholesterol, Total: 106 mg/dL (ref 100–199)
HDL: 40 mg/dL (ref >39)
LDL Chol Calc (NIH): 46 mg/dL (ref 0–99)
Triglycerides: 107 mg/dL (ref 0–149)
VLDL Cholesterol Cal: 20 mg/dL (ref 5–40)

## 2021-05-17 ENCOUNTER — Ambulatory Visit: Payer: Medicare Other | Admitting: Cardiology

## 2021-06-17 ENCOUNTER — Other Ambulatory Visit: Payer: Self-pay | Admitting: *Deleted

## 2021-06-17 ENCOUNTER — Telehealth: Payer: Self-pay | Admitting: *Deleted

## 2021-06-17 ENCOUNTER — Telehealth: Payer: Self-pay | Admitting: Cardiology

## 2021-06-17 MED ORDER — CLOPIDOGREL BISULFATE 75 MG PO TABS: 75.0000 mg | ORAL_TABLET | Freq: Every day | ORAL | 1 refills | Status: DC

## 2021-06-17 NOTE — Telephone Encounter (Signed)
*  STAT* If patient is at the pharmacy, call can be transferred to refill team.   1. Which medications need to be refilled? (please list name of each medication and dose if known)  clopidogrel (PLAVIX) 75 MG tablet  2. Which pharmacy/location (including street and city if local pharmacy) is medication to be sent to? [facility] [id number] - [place], [place] - [address]  3. Do they need a 30 day or 90 day supply? Pt is completely out of this medicine

## 2021-06-17 NOTE — Telephone Encounter (Signed)
Medication filled.  

## 2021-06-17 NOTE — Telephone Encounter (Signed)
Patient called the office requesting a refill for Plavix 75 mg. Left voicemail that medication had already been sent to [facility].

## 2021-08-30 ENCOUNTER — Other Ambulatory Visit: Payer: Self-pay

## 2021-09-06 ENCOUNTER — Encounter: Payer: Self-pay | Admitting: Cardiology

## 2021-09-06 ENCOUNTER — Other Ambulatory Visit: Payer: Self-pay

## 2021-09-06 ENCOUNTER — Ambulatory Visit: Payer: Medicare Other | Admitting: Cardiology

## 2021-09-06 VITALS — BP 150/70 | Ht 62.0 in | Wt 139.1 lb

## 2021-09-06 DIAGNOSIS — I10 Essential (primary) hypertension: Secondary | ICD-10-CM

## 2021-09-06 DIAGNOSIS — E088 Diabetes mellitus due to underlying condition with unspecified complications: Secondary | ICD-10-CM

## 2021-09-06 DIAGNOSIS — E785 Hyperlipidemia, unspecified: Secondary | ICD-10-CM

## 2021-09-06 DIAGNOSIS — I2510 Atherosclerotic heart disease of native coronary artery without angina pectoris: Secondary | ICD-10-CM

## 2021-09-06 DIAGNOSIS — Z951 Presence of aortocoronary bypass graft: Secondary | ICD-10-CM | POA: Diagnosis not present

## 2021-09-06 MED ORDER — ATORVASTATIN CALCIUM 40 MG PO TABS: 40.0000 mg | ORAL_TABLET | Freq: Every day | ORAL | 3 refills | Status: DC

## 2021-09-06 MED ORDER — RANOLAZINE ER 1000 MG PO TB12: 1000.0000 mg | ORAL_TABLET | Freq: Two times a day (BID) | ORAL | 3 refills | Status: DC

## 2021-09-06 MED ORDER — CLOPIDOGREL BISULFATE 75 MG PO TABS: 75.0000 mg | ORAL_TABLET | Freq: Every day | ORAL | 1 refills | Status: DC

## 2021-09-06 MED ORDER — LOSARTAN POTASSIUM 25 MG PO TABS: 12.5000 mg | ORAL_TABLET | Freq: Two times a day (BID) | ORAL | 3 refills | Status: DC

## 2021-09-06 MED ORDER — CLOPIDOGREL BISULFATE 75 MG PO TABS: 75.0000 mg | ORAL_TABLET | Freq: Every day | ORAL | 3 refills | Status: DC

## 2021-09-06 MED ORDER — NITROGLYCERIN 0.4 MG SL SUBL: 0.4000 mg | SUBLINGUAL_TABLET | SUBLINGUAL | 6 refills | Status: DC | PRN

## 2021-09-06 NOTE — Progress Notes (Signed)
Cardiology Office Note:    Date:  09/06/2021   ID:  Nicholas [name], DOB 1956-06-07, MRN [id number]  PCP:  Anselmo Pickler, MD  Cardiologist:  Garwin Brothers, MD   Referring MD: Anselmo Pickler, *    ASSESSMENT:    1. Coronary artery disease involving native coronary artery of native heart without angina pectoris   2. Primary hypertension   3. S/P CABG x 3   4. Diabetes mellitus due to underlying condition with unspecified complications (HCC)   5. Dyslipidemia, goal LDL below 70    PLAN:    In order of problems listed above:  Coronary artery disease: Secondary prevention.  Importance of compliance with diet medication stressed and he vocalized understanding.  He was advised to continue his excellent exercise pattern he brought blood work from primary care and I reviewed the blood work and it is fine. Essential hypertension: Patient has an element of whitecoat hypertension and his blood pressures and I reassured him about this. Mixed dyslipidemia: Lipids were reviewed and they are fine and diet was emphasized.  Patient's questions were answered to his satisfaction. Patient will be seen in follow-up appointment in 6 months or earlier if the patient has any concerns    Medication Adjustments/Labs and Tests Ordered: Current medicines are reviewed at length with the patient today.  Concerns regarding medicines are outlined above.  No orders of the defined types were placed in this encounter.  No orders of the defined types were placed in this encounter.    No chief complaint on file.    History of Present Illness:    Nicholas [name] is a 66 y.o. male.  Patient has past medical history of coronary artery disease, essential hypertension and dyslipidemia.  He denies any problems at this time and takes care of activities of daily no chest pain, orthopnea or PND.  He walks more than half an basis.  At the time of my evaluation, the patient is alert awake oriented and in no  distress.  He walks more than half an hour a day on a daily basis.  With this he is asymptomatic  Past Medical History:  Diagnosis Date   CAD (coronary artery disease) 07/15/2018   Carotid stenosis 02/21/2019   Chronic systolic (congestive) heart failure (HCC) 07/15/2018   Coronary artery disease    Diabetes mellitus due to underlying condition with unspecified complications (HCC) 05/31/2018   Dyslipidemia, goal LDL below 70 03/27/2018   Hyperlipidemia   Heart murmur    High cholesterol    HTN (hypertension) 07/15/2018   Hypertension    Non-insulin dependent type 2 diabetes mellitus (HCC) 03/27/2018   PONV (postoperative nausea and vomiting)    Pulmonary nodules 07/15/2018   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   03/04/18- Coronary artery bypass grafting x3 (free left internal mammary artery graft to left anterior descending, saphenous vein graft to obtuse marginal, saphenous vein graft to diagonal. Normal LVF pre op     Stricture of artery (HCC) 02/02/2012   Subclavian arterial stenosis (HCC) 01/19/2012   Left subclavian stenosis 75% with pressure gradient of 60 mm Hg at cath   Syncope 07/15/2018   Type II diabetes mellitus (HCC)     Past Surgical History:  Procedure Laterality Date   CATARACT EXTRACTION W/ INTRAOCULAR LENS  IMPLANT, BILATERAL Bilateral 2012-2017   CORONARY ARTERY BYPASS GRAFT N/A 03/04/2018   Procedure: CORONARY ARTERY BYPASS GRAFTING (CABG) x 3: -FREE LIMA to LAD, -SVG to DIAGONAL, -SVG to OM; ENDOSCOPIC  HARVEST GREATER SAPHENOUS VEIN: -Right Thigh  ;  Surgeon: Kerin PernaVan Trigt, Peter, MD;  [facility];  Service: Open Heart Surgery;  Laterality: N/A;   CORONARY STENT INTERVENTION N/A 05/20/2018   Procedure: CORONARY STENT INTERVENTION;  Surgeon: Lyn RecordsSmith, Henry W, MD;  Location: [facility] INVASIVE CV LAB;  Service: Cardiovascular;  Laterality: N/A;   EYE SURGERY     INTRAVASCULAR PRESSURE WIRE/FFR STUDY N/A 03/01/2018   Procedure: INTRAVASCULAR PRESSURE WIRE/FFR STUDY;  Surgeon: SwazilandJordan, Peter  M, MD;  [facility];  Service: Cardiovascular;  Laterality: N/A;   LEFT HEART CATH AND CORONARY ANGIOGRAPHY N/A 03/01/2018   Procedure: LEFT HEART CATH AND CORONARY ANGIOGRAPHY;  Surgeon: SwazilandJordan, Peter M, MD;  [facility];  Service: Cardiovascular;  Laterality: N/A;   LEFT HEART CATH AND CORS/GRAFTS ANGIOGRAPHY N/A 05/16/2018   Procedure: LEFT HEART CATH AND CORS/GRAFTS ANGIOGRAPHY;  Surgeon: Lennette BihariKelly, Thomas A, MD;  Location: [facility] INVASIVE CV LAB;  Service: Cardiovascular;  Laterality: N/A;   LEFT HEART CATH AND CORS/GRAFTS ANGIOGRAPHY N/A 02/17/2021   Procedure: LEFT HEART CATH AND CORS/GRAFTS ANGIOGRAPHY;  Surgeon: Kathleene HazelMcAlhany, Christopher D, MD;  Location: [facility] INVASIVE CV LAB;  Service: Cardiovascular;  Laterality: N/A;   TEE WITHOUT CARDIOVERSION N/A 03/04/2018   Procedure: TRANSESOPHAGEAL ECHOCARDIOGRAM (TEE);  Surgeon: Donata ClayVan Trigt, Theron AristaPeter, MD;  [facility];  Service: Open Heart Surgery;  Laterality: N/A;    Current Medications: No outpatient medications have been marked as taking for the 09/06/21 encounter (Office Visit) with Lonnetta Kniskern, Aundra Dubinajan R, MD.     Allergies:   Eggs or egg-derived products and Latex   Social History   Socioeconomic History   Marital status: Married    Spouse name: Not on file   Number of children: Not on file   Years of education: Not on file   Highest education level: Not on file  Occupational History   Not on file  Tobacco Use   Smoking status: Never   Smokeless tobacco: Never  Vaping Use   Vaping Use: Never used  Substance and Sexual Activity   Alcohol use: Not Currently    Comment: 05/20/2018 "stopped before 1999"   Drug use: Never   Sexual activity: Not on file  Other Topics Concern   Not on file  Social History Narrative   Not on file   Social Determinants of Health   Financial Resource Strain: Not on file  Food Insecurity: Not on file  Transportation Needs: Not on file  Physical Activity: Not on file  Stress: Not on  file  Social Connections: Not on file     Family History: The patient's family history includes Heart disease in his mother.  ROS:   Please see the history of present illness.    All other systems reviewed and are negative.  EKGs/Labs/Other Studies Reviewed:    The following studies were reviewed today: I discussed my findings with the patient.   Recent Labs: 03/25/2021: ALT 16; BUN 12; Creatinine, Ser 0.93; Hemoglobin 13.7; Platelets 192; Potassium 4.5; Sodium 139; TSH 1.820  Recent Lipid Panel    Component Value Date/Time   CHOL 106 03/25/2021 0857   TRIG 107 03/25/2021 0857   HDL 40 03/25/2021 0857   CHOLHDL 2.7 03/25/2021 0857   CHOLHDL 6.1 02/28/2018 0346   VLDL 55 (H) 02/28/2018 0346   LDLCALC 46 03/25/2021 0857    Physical Exam:    VS:  There were no vitals taken for this visit.    Wt Readings from  Last 3 Encounters:  03/25/21 135 lb 1.9 oz (61.3 kg)  02/17/21 137 lb (62.1 kg)  11/08/20 145 lb (65.8 kg)     GEN: Patient is in no acute distress HEENT: Normal NECK: No JVD; No carotid bruits LYMPHATICS: No lymphadenopathy CARDIAC: Hear sounds regular, 2/6 systolic murmur at the apex. RESPIRATORY:  Clear to auscultation without rales, wheezing or rhonchi  ABDOMEN: Soft, non-tender, non-distended MUSCULOSKELETAL:  No edema; No deformity  SKIN: Warm and dry NEUROLOGIC:  Alert and oriented x 3 PSYCHIATRIC:  Normal affect   Signed, Garwin Brothers, MD  09/06/2021 8:45 AM    [facility]

## 2021-09-06 NOTE — Patient Instructions (Signed)

## 2022-03-28 ENCOUNTER — Ambulatory Visit: Payer: Medicare Other | Admitting: Cardiology

## 2022-04-18 ENCOUNTER — Ambulatory Visit: Payer: Medicare Other | Admitting: Cardiology

## 2022-04-18 ENCOUNTER — Encounter: Payer: Self-pay | Admitting: Cardiology

## 2022-04-18 VITALS — BP 134/68 | HR 62 | Ht 62.0 in | Wt 137.2 lb

## 2022-04-18 DIAGNOSIS — E785 Hyperlipidemia, unspecified: Secondary | ICD-10-CM

## 2022-04-18 DIAGNOSIS — I2510 Atherosclerotic heart disease of native coronary artery without angina pectoris: Secondary | ICD-10-CM

## 2022-04-18 DIAGNOSIS — I10 Essential (primary) hypertension: Secondary | ICD-10-CM

## 2022-04-18 DIAGNOSIS — E088 Diabetes mellitus due to underlying condition with unspecified complications: Secondary | ICD-10-CM

## 2022-04-18 DIAGNOSIS — Z951 Presence of aortocoronary bypass graft: Secondary | ICD-10-CM

## 2022-04-18 NOTE — Addendum Note (Signed)
Addended by: Eleonore Chiquito on: 04/18/2022 03:13 PM   Modules accepted: Orders

## 2022-04-18 NOTE — Patient Instructions (Signed)
Medication Instructions:  ?Your physician recommends that you continue on your current medications as directed. Please refer to the Current Medication list given to you today. ? ?*If you need a refill on your cardiac medications before your next appointment, please call your pharmacy* ? ? ?Lab Work: ?None ordered ?If you have labs (blood work) drawn today and your tests are completely normal, you will receive your results only by: ?MyChart Message (if you have MyChart) OR ?A paper copy in the mail ?If you have any lab test that is abnormal or we need to change your treatment, we will call you to review the results. ? ? ?Testing/Procedures: ?Your physician has requested that you have an echocardiogram. Echocardiography is a painless test that uses sound waves to create images of your heart. It provides your doctor with information about the size and shape of your heart and how well your heart?s chambers and valves are working. This procedure takes approximately one hour. There are no restrictions for this procedure. ? ? ? ?Follow-Up: ?At [facility], you and your health needs are our priority.  As part of our continuing mission to provide you with exceptional heart care, we have created designated Provider Care Teams.  These Care Teams include your primary Cardiologist (physician) and Advanced Practice Providers (APPs -  Physician Assistants and Nurse Practitioners) who all work together to provide you with the care you need, when you need it. ? ?We recommend signing up for the patient portal called "MyChart".  Sign up information is provided on this After Visit Summary.  MyChart is used to connect with patients for Virtual Visits (Telemedicine).  Patients are able to view lab/test results, encounter notes, upcoming appointments, etc.  Non-urgent messages can be sent to your provider as well.   ?To learn more about what you can do with MyChart, go to [email or web address].   ? ?Your next appointment:   ?9  month(s) ? ?The format for your next appointment:   ?In Person ? ?Provider:   ?Rajan Revankar, MD ? ? ?Other Instructions ?Echocardiogram ?An echocardiogram is a test that uses sound waves (ultrasound) to produce images of the heart. ?Images from an echocardiogram can provide important information about: ?Heart size and shape. ?The size and thickness and movement of your heart's walls. ?Heart muscle function and strength. ?Heart valve function or if you have stenosis. Stenosis is when the heart valves are too narrow. ?If blood is flowing backward through the heart valves (regurgitation). ?A tumor or infectious growth around the heart valves. ?Areas of heart muscle that are not working well because of poor blood flow or injury from a heart attack. ?Aneurysm detection. An aneurysm is a weak or damaged part of an artery wall. The wall bulges out from the normal force of blood pumping through the body. ?Tell a health care provider about: ?Any allergies you have. ?All medicines you are taking, including vitamins, herbs, eye drops, creams, and over-the-counter medicines. ?Any blood disorders you have. ?Any surgeries you have had. ?Any medical conditions you have. ?Whether you are pregnant or may be pregnant. ?What are the risks? ?Generally, this is a safe test. However, problems may occur, including an allergic reaction to dye (contrast) that may be used during the test. ?What happens before the test? ?No specific preparation is needed. You may eat and drink normally. ?What happens during the test? ?You will take off your clothes from the waist up and put on a hospital gown. ?Electrodes or electrocardiogram (ECG)patches may be placed on   your chest. The electrodes or patches are then connected to a device that monitors your heart rate and rhythm. ?You will lie down on a table for an ultrasound exam. A gel will be applied to your chest to help sound waves pass through your skin. ?A handheld device, called a transducer, will  be pressed against your chest and moved over your heart. The transducer produces sound waves that travel to your heart and bounce back (or "echo" back) to the transducer. These sound waves will be captured in real-time and changed into images of your heart that can be viewed on a video monitor. The images will be recorded on a computer and reviewed by your health care provider. ?You may be asked to change positions or hold your breath for a short time. This makes it easier to get different views or better views of your heart. ?In some cases, you may receive contrast through an IV in one of your veins. This can improve the quality of the pictures from your heart. ?The procedure may vary among health care providers and hospitals.   ?What can I expect after the test? ?You may return to your normal, everyday life, including diet, activities, and medicines, unless your health care provider tells you not to do that. ?Follow these instructions at home: ?It is up to you to get the results of your test. Ask your health care provider, or the department that is doing the test, when your results will be ready. ?Keep all follow-up visits. This is important. ?Summary ?An echocardiogram is a test that uses sound waves (ultrasound) to produce images of the heart. ?Images from an echocardiogram can provide important information about the size and shape of your heart, heart muscle function, heart valve function, and other possible heart problems. ?You do not need to do anything to prepare before this test. You may eat and drink normally. ?After the echocardiogram is completed, you may return to your normal, everyday life, unless your health care provider tells you not to do that. ?This information is not intended to replace advice given to you by your health care provider. Make sure you discuss any questions you have with your health care provider. ?Document Revised: 04/06/2020 Document Reviewed: 04/06/2020 ?Elsevier Patient  Education ? 2021 [employer] ? ? ?

## 2022-04-18 NOTE — Progress Notes (Signed)
Cardiology Office Note:    Date:  04/18/2022   ID:  Nicholas [name], DOB 1956-04-04, MRN [id number]  PCP:  Anselmo Pickler, MD  Cardiologist:  Garwin Brothers, MD   Referring MD: Anselmo Pickler, *    ASSESSMENT:    1. Coronary artery disease involving native coronary artery of native heart without angina pectoris   2. Dyslipidemia, goal LDL below 70   3. Primary hypertension   4. Diabetes mellitus due to underlying condition with unspecified complications (HCC)   5. S/P CABG x 3    PLAN:    In order of problems listed above:  Coronary artery disease: Secondary prevention stressed with the patient.  Importance of compliance with diet medication stressed any vocalized understanding.  He was advised to walk at least half an hour a day 5 days a week and he vocalized understanding.  He is currently doing that. Essential hypertension: Blood pressure stable and diet was emphasized.  Lifestyle modification urged Mixed dyslipidemia: Diet was emphasized.  He had blood work today and we will get a copy of report from primary care. Diabetes mellitus: Followed by primary care.  Diet emphasized. Echo will be done to assess murmur heard on auscultation. Patient will be seen in follow-up appointment in 9 months or earlier if the patient has any concerns    Medication Adjustments/Labs and Tests Ordered: Current medicines are reviewed at length with the patient today.  Concerns regarding medicines are outlined above.  No orders of the defined types were placed in this encounter.  No orders of the defined types were placed in this encounter.    No chief complaint on file.    History of Present Illness:    Nicholas [name] is a 66 y.o. male.  Patient has past medical history of coronary artery disease, essential hypertension, dyslipidemia and diabetes mellitus.  He denies any problems at this time and takes care of activities of daily living.  No chest pain orthopnea or PND.  At the  time of my evaluation, the patient is alert awake oriented and in no distress.  He walks on a regular basis at least half an hour a day.  Past Medical History:  Diagnosis Date   CAD (coronary artery disease) 07/15/2018   Carotid stenosis 02/21/2019   Chronic systolic (congestive) heart failure (HCC) 07/15/2018   Coronary artery disease    Diabetes mellitus due to underlying condition with unspecified complications (HCC) 05/31/2018   Dyslipidemia, goal LDL below 70 03/27/2018   Hyperlipidemia   Heart murmur    High cholesterol    HTN (hypertension) 07/15/2018   Hypertension    Non-insulin dependent type 2 diabetes mellitus (HCC) 03/27/2018   PONV (postoperative nausea and vomiting)    Pulmonary nodules 07/15/2018   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   03/04/18- Coronary artery bypass grafting x3 (free left internal mammary artery graft to left anterior descending, saphenous vein graft to obtuse marginal, saphenous vein graft to diagonal. Normal LVF pre op     Stricture of artery (HCC) 02/02/2012   Subclavian arterial stenosis (HCC) 01/19/2012   Left subclavian stenosis 75% with pressure gradient of 60 mm Hg at cath   Syncope 07/15/2018   Type II diabetes mellitus (HCC)     Past Surgical History:  Procedure Laterality Date   CATARACT EXTRACTION W/ INTRAOCULAR LENS  IMPLANT, BILATERAL Bilateral 2012-2017   CORONARY ARTERY BYPASS GRAFT N/A 03/04/2018   Procedure: CORONARY ARTERY BYPASS GRAFTING (CABG) x 3: -FREE LIMA to LAD, -  SVG to DIAGONAL, -SVG to OM; ENDOSCOPIC HARVEST GREATER SAPHENOUS VEIN: -Right Thigh  ;  Surgeon: Kerin Perna, MD;  Location: [facility] OR;  Service: Open Heart Surgery;  Laterality: N/A;   CORONARY STENT INTERVENTION N/A 05/20/2018   Procedure: CORONARY STENT INTERVENTION;  Surgeon: Lyn Records, MD;  Location: [facility] INVASIVE CV LAB;  Service: Cardiovascular;  Laterality: N/A;   EYE SURGERY     INTRAVASCULAR PRESSURE WIRE/FFR STUDY N/A 03/01/2018   Procedure: INTRAVASCULAR PRESSURE  WIRE/FFR STUDY;  Surgeon: Swaziland, Peter M, MD;  Location: [facility] INVASIVE CV LAB;  Service: Cardiovascular;  Laterality: N/A;   LEFT HEART CATH AND CORONARY ANGIOGRAPHY N/A 03/01/2018   Procedure: LEFT HEART CATH AND CORONARY ANGIOGRAPHY;  Surgeon: Swaziland, Peter M, MD;  Location: [facility] INVASIVE CV LAB;  Service: Cardiovascular;  Laterality: N/A;   LEFT HEART CATH AND CORS/GRAFTS ANGIOGRAPHY N/A 05/16/2018   Procedure: LEFT HEART CATH AND CORS/GRAFTS ANGIOGRAPHY;  Surgeon: Lennette Bihari, MD;  Location: [facility] INVASIVE CV LAB;  Service: Cardiovascular;  Laterality: N/A;   LEFT HEART CATH AND CORS/GRAFTS ANGIOGRAPHY N/A 02/17/2021   Procedure: LEFT HEART CATH AND CORS/GRAFTS ANGIOGRAPHY;  Surgeon: Kathleene Hazel, MD;  Location: [facility] INVASIVE CV LAB;  Service: Cardiovascular;  Laterality: N/A;   TEE WITHOUT CARDIOVERSION N/A 03/04/2018   Procedure: TRANSESOPHAGEAL ECHOCARDIOGRAM (TEE);  Surgeon: Donata Clay, Theron Arista, MD;  Location: [facility] OR;  Service: Open Heart Surgery;  Laterality: N/A;    Current Medications: Current Meds  Medication Sig   atorvastatin (LIPITOR) 40 MG tablet Take 1 tablet (40 mg total) by mouth daily.   clopidogrel (PLAVIX) 75 MG tablet Take 1 tablet (75 mg total) by mouth daily.   finasteride (PROSCAR) 5 MG tablet Take 5 mg by mouth daily.   losartan (COZAAR) 25 MG tablet Take 0.5 tablets (12.5 mg total) by mouth in the morning and at bedtime.   metFORMIN (GLUCOPHAGE) 500 MG tablet Take 500 mg by mouth 2 (two) times daily with a meal.   nitroGLYCERIN (NITROSTAT) 0.4 MG SL tablet Place 1 tablet (0.4 mg total) under the tongue every 5 (five) minutes x 3 doses as needed for chest pain.   ranolazine (RANEXA) 1000 MG SR tablet Take 1 tablet (1,000 mg total) by mouth 2 (two) times daily.     Allergies:   Eggs or egg-derived products and Latex   Social History   Socioeconomic History   Marital status: Married    Spouse name: Not on file   Number of children: Not on file   Years of  education: Not on file   Highest education level: Not on file  Occupational History   Not on file  Tobacco Use   Smoking status: Never   Smokeless tobacco: Never  Vaping Use   Vaping Use: Never used  Substance and Sexual Activity   Alcohol use: Not Currently    Comment: 05/20/2018 "stopped before 1999"   Drug use: Never   Sexual activity: Not on file  Other Topics Concern   Not on file  Social History Narrative   Not on file   Social Determinants of Health   Financial Resource Strain: Not on file  Food Insecurity: Not on file  Transportation Needs: Not on file  Physical Activity: Not on file  Stress: Not on file  Social Connections: Not on file     Family History: The patient's family history includes Heart disease in his mother.  ROS:   Please see the history of present illness.  All other systems reviewed and are negative.  EKGs/Labs/Other Studies Reviewed:    The following studies were reviewed today: EKG reveals sinus rhythm and nonspecific ST-T changes   Recent Labs: No results found for requested labs within last 365 days.  Recent Lipid Panel    Component Value Date/Time   CHOL 106 03/25/2021 0857   TRIG 107 03/25/2021 0857   HDL 40 03/25/2021 0857   CHOLHDL 2.7 03/25/2021 0857   CHOLHDL 6.1 02/28/2018 0346   VLDL 55 (H) 02/28/2018 0346   LDLCALC 46 03/25/2021 0857    Physical Exam:    VS:  BP 134/68 (BP Location: Left Arm, Patient Position: Sitting)   Pulse 62   Ht 5\' 2"  (1.575 m)   Wt 137 lb 3.2 oz (62.2 kg)   SpO2 98%   BMI 25.09 kg/m     Wt Readings from Last 3 Encounters:  04/18/22 137 lb 3.2 oz (62.2 kg)  09/06/21 139 lb 1.9 oz (63.1 kg)  03/25/21 135 lb 1.9 oz (61.3 kg)     GEN: Patient is in no acute distress HEENT: Normal NECK: No JVD; No carotid bruits LYMPHATICS: No lymphadenopathy CARDIAC: Hear sounds regular, 2/6 systolic murmur at the apex. RESPIRATORY:  Clear to auscultation without rales, wheezing or rhonchi   ABDOMEN: Soft, non-tender, non-distended MUSCULOSKELETAL:  No edema; No deformity  SKIN: Warm and dry NEUROLOGIC:  Alert and oriented x 3 PSYCHIATRIC:  Normal affect   Signed, Jenean Lindau, MD  04/18/2022 2:56 PM    [place]

## 2022-05-02 ENCOUNTER — Ambulatory Visit (HOSPITAL_BASED_OUTPATIENT_CLINIC_OR_DEPARTMENT_OTHER): Payer: Medicare Other

## 2022-08-30 ENCOUNTER — Other Ambulatory Visit: Payer: Self-pay | Admitting: Cardiology

## 2022-08-30 NOTE — Telephone Encounter (Signed)
Rx refill sent to pharmacy. 

## 2022-09-11 ENCOUNTER — Other Ambulatory Visit: Payer: Self-pay | Admitting: Cardiology

## 2022-12-04 ENCOUNTER — Other Ambulatory Visit: Payer: Self-pay | Admitting: Cardiology

## 2023-02-21 ENCOUNTER — Ambulatory Visit: Payer: Medicare Other | Attending: Cardiology | Admitting: Cardiology

## 2023-02-21 ENCOUNTER — Ambulatory Visit: Payer: Medicare Other | Admitting: Cardiology

## 2023-02-21 ENCOUNTER — Encounter: Payer: Self-pay | Admitting: Cardiology

## 2023-02-21 VITALS — BP 144/74 | HR 58 | Ht 62.0 in | Wt 135.6 lb

## 2023-02-21 DIAGNOSIS — I2510 Atherosclerotic heart disease of native coronary artery without angina pectoris: Secondary | ICD-10-CM | POA: Diagnosis not present

## 2023-02-21 DIAGNOSIS — E785 Hyperlipidemia, unspecified: Secondary | ICD-10-CM

## 2023-02-21 DIAGNOSIS — Z7984 Long term (current) use of oral hypoglycemic drugs: Secondary | ICD-10-CM

## 2023-02-21 DIAGNOSIS — Z951 Presence of aortocoronary bypass graft: Secondary | ICD-10-CM

## 2023-02-21 DIAGNOSIS — E7800 Pure hypercholesterolemia, unspecified: Secondary | ICD-10-CM

## 2023-02-21 DIAGNOSIS — I10 Essential (primary) hypertension: Secondary | ICD-10-CM

## 2023-02-21 DIAGNOSIS — E088 Diabetes mellitus due to underlying condition with unspecified complications: Secondary | ICD-10-CM

## 2023-02-21 NOTE — Progress Notes (Signed)
Cardiology Office Note:    Date:  02/21/2023   ID:  Nicholas [name], DOB 06/01/1956, MRN [id number]  PCP:  Nicholas Pickler, MD  Cardiologist:  Nicholas Brothers, MD   Referring MD: Nicholas [name], *    ASSESSMENT:    1. Primary hypertension   2. Coronary artery disease involving native coronary artery of native heart without angina pectoris   3. Diabetes mellitus due to underlying condition with unspecified complications (HCC)   4. S/P CABG x 3   5. High cholesterol   6. Dyslipidemia, goal LDL below 70    PLAN:    In order of problems listed above:  Coronary artery disease: Secondary prevention stressed with the patient.  Importance of compliance with diet medication stressed and vocalized understanding.  He is doing excellent with secondary prevention and exercise and I congratulated him about this. Essential hypertension: Blood pressure stable and diet was emphasized.  His blood pressure is fine at home.  Repeat blood pressures are 132/72. Mixed dyslipidemia: On lipid-lowering medications.  He had blood work done recently by primary care and we will get a copy and advise him accordingly. Diabetes mellitus: Followed by primary care.  Diet emphasized.  Again we will review blood work and advise accordingly. Patient will be seen in follow-up appointment in 6 months or earlier if the patient has any concerns.    Medication Adjustments/Labs and Tests Ordered: Current medicines are reviewed at length with the patient today.  Concerns regarding medicines are outlined above.  Orders Placed This Encounter  Procedures   EKG 12-Lead   No orders of the defined types were placed in this encounter.    No chief complaint on file.    History of Present Illness:    Nicholas [name] is a 67 y.o. male.  Patient has past medical history of coronary artery disease post CABG surgery, essential hypertension, mixed dyslipidemia and diabetes mellitus.  He denies any problems at this time  and takes care of activities of daily living.  No chest pain orthopnea or PND.  He walks at least for about 45 minutes on a regular basis.  At the time of my evaluation, the patient is alert awake oriented and in no distress.  Past Medical History:  Diagnosis Date   CAD (coronary artery disease) 07/15/2018   Carotid stenosis 02/21/2019   Chronic systolic (congestive) heart failure (HCC) 07/15/2018   Coronary artery disease    Diabetes mellitus due to underlying condition with unspecified complications (HCC) 05/31/2018   Dyslipidemia, goal LDL below 70 03/27/2018   Hyperlipidemia   Heart murmur    High cholesterol    HTN (hypertension) 07/15/2018   Hypertension    Non-insulin dependent type 2 diabetes mellitus (HCC) 03/27/2018   PONV (postoperative nausea and vomiting)    Pulmonary nodules 07/15/2018   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   03/04/18- Coronary artery bypass grafting x3 (free left internal mammary artery graft to left anterior descending, saphenous vein graft to obtuse marginal, saphenous vein graft to diagonal. Normal LVF pre op     Stricture of artery (HCC) 02/02/2012   Subclavian arterial stenosis (HCC) 01/19/2012   Left subclavian stenosis 75% with pressure gradient of 60 mm Hg at cath   Syncope 07/15/2018   Type II diabetes mellitus (HCC)     Past Surgical History:  Procedure Laterality Date   CATARACT EXTRACTION W/ INTRAOCULAR LENS  IMPLANT, BILATERAL Bilateral 2012-2017   CORONARY ARTERY BYPASS GRAFT N/A 03/04/2018   Procedure: CORONARY  ARTERY BYPASS GRAFTING (CABG) x 3: -FREE LIMA to LAD, -SVG to DIAGONAL, -SVG to OM; ENDOSCOPIC HARVEST GREATER SAPHENOUS VEIN: -Right Thigh  ;  Surgeon: Nicholas Perna, MD;  Location: [facility] OR;  Service: Open Heart Surgery;  Laterality: N/A;   CORONARY PRESSURE/FFR STUDY N/A 03/01/2018   Procedure: INTRAVASCULAR PRESSURE WIRE/FFR STUDY;  Surgeon: Swaziland, Nicholas M, MD;  Location: [facility] INVASIVE CV LAB;  Service: Cardiovascular;  Laterality: N/A;    CORONARY STENT INTERVENTION N/A 05/20/2018   Procedure: CORONARY STENT INTERVENTION;  Surgeon: Nicholas Records, MD;  Location: [facility] INVASIVE CV LAB;  Service: Cardiovascular;  Laterality: N/A;   EYE SURGERY     LEFT HEART CATH AND CORONARY ANGIOGRAPHY N/A 03/01/2018   Procedure: LEFT HEART CATH AND CORONARY ANGIOGRAPHY;  Surgeon: Swaziland, Nicholas M, MD;  Location: [facility] INVASIVE CV LAB;  Service: Cardiovascular;  Laterality: N/A;   LEFT HEART CATH AND CORS/GRAFTS ANGIOGRAPHY N/A 05/16/2018   Procedure: LEFT HEART CATH AND CORS/GRAFTS ANGIOGRAPHY;  Surgeon: Nicholas Bihari, MD;  Location: [facility] INVASIVE CV LAB;  Service: Cardiovascular;  Laterality: N/A;   LEFT HEART CATH AND CORS/GRAFTS ANGIOGRAPHY N/A 02/17/2021   Procedure: LEFT HEART CATH AND CORS/GRAFTS ANGIOGRAPHY;  Surgeon: Nicholas Hazel, MD;  Location: [facility] INVASIVE CV LAB;  Service: Cardiovascular;  Laterality: N/A;   TEE WITHOUT CARDIOVERSION N/A 03/04/2018   Procedure: TRANSESOPHAGEAL ECHOCARDIOGRAM (TEE);  Surgeon: Nicholas [name], Nicholas Arista, MD;  Location: [facility] OR;  Service: Open Heart Surgery;  Laterality: N/A;    Current Medications: Current Meds  Medication Sig   atorvastatin (LIPITOR) 40 MG tablet Take 1 tablet (40 mg total) by mouth daily. (Patient taking differently: Take 20 mg by mouth daily.)   clopidogrel (PLAVIX) 75 MG tablet Take 1 tablet (75 mg total) by mouth daily.   finasteride (PROSCAR) 5 MG tablet Take 5 mg by mouth daily.   losartan (COZAAR) 25 MG tablet Take 0.5 tablets (12.5 mg total) by mouth 2 (two) times daily.   metFORMIN (GLUCOPHAGE) 500 MG tablet Take 500 mg by mouth 2 (two) times daily with a meal.   nitroGLYCERIN (NITROSTAT) 0.4 MG SL tablet Place 1 tablet (0.4 mg total) under the tongue every 5 (five) minutes x 3 doses as needed for chest pain.   ranolazine (RANEXA) 1000 MG SR tablet Take 1 tablet (1,000 mg total) by mouth 2 (two) times daily.     Allergies:   Egg-derived products and Latex   Social History    Socioeconomic History   Marital status: Married    Spouse name: Not on file   Number of children: Not on file   Years of education: Not on file   Highest education level: Not on file  Occupational History   Not on file  Tobacco Use   Smoking status: Never   Smokeless tobacco: Never  Vaping Use   Vaping Use: Never used  Substance and Sexual Activity   Alcohol use: Not Currently    Comment: 05/20/2018 "stopped before 1999"   Drug use: Never   Sexual activity: Not on file  Other Topics Concern   Not on file  Social History Narrative   Not on file   Social Determinants of Health   Financial Resource Strain: Not on file  Food Insecurity: Not on file  Transportation Needs: Not on file  Physical Activity: Not on file  Stress: Not on file  Social Connections: Not on file     Family History: The patient's family history includes Heart disease in his  mother.  ROS:   Please see the history of present illness.    All other systems reviewed and are negative.  EKGs/Labs/Other Studies Reviewed:    The following studies were reviewed today: EKG revealed sinus rhythm and nonspecific ST-T changes   Recent Labs: No results found for requested labs within last 365 days.  Recent Lipid Panel    Component Value Date/Time   CHOL 106 03/25/2021 0857   TRIG 107 03/25/2021 0857   HDL 40 03/25/2021 0857   CHOLHDL 2.7 03/25/2021 0857   CHOLHDL 6.1 02/28/2018 0346   VLDL 55 (H) 02/28/2018 0346   LDLCALC 46 03/25/2021 0857    Physical Exam:    VS:  BP (!) 144/74   Pulse (!) 58   Ht 5\' 2"  (1.575 [name])   Wt 135 lb 9.6 oz (61.5 kg)   SpO2 97%   BMI 24.80 kg/[name]     Wt Readings from Last 3 Encounters:  02/21/23 135 lb 9.6 oz (61.5 kg)  04/18/22 137 lb 3.2 oz (62.2 kg)  09/06/21 139 lb 1.9 oz (63.1 kg)     GEN: Patient is in no acute distress HEENT: Normal NECK: No JVD; No carotid bruits LYMPHATICS: No lymphadenopathy CARDIAC: Hear sounds regular, 2/6 systolic murmur at the  apex. RESPIRATORY:  Clear to auscultation without rales, wheezing or rhonchi  ABDOMEN: Soft, non-tender, non-distended MUSCULOSKELETAL:  No edema; No deformity  SKIN: Warm and dry NEUROLOGIC:  Alert and oriented x 3 PSYCHIATRIC:  Normal affect   Signed, Nicholas Brothers, MD  02/21/2023 2:58 PM    [facility]

## 2023-02-21 NOTE — Patient Instructions (Signed)
Medication Instructions:  Your physician recommends that you continue on your current medications as directed. Please refer to the Current Medication list given to you today.  *If you need a refill on your cardiac medications before your next appointment, please call your pharmacy*   Lab Work: None ordered If you have labs (blood work) drawn today and your tests are completely normal, you will receive your results only by: MyChart Message (if you have MyChart) OR A paper copy in the mail If you have any lab test that is abnormal or we need to change your treatment, we will call you to review the results.   Testing/Procedures: Your physician has requested that you have an echocardiogram. Echocardiography is a painless test that uses sound waves to create images of your heart. It provides your doctor with information about the size and shape of your heart and how well your heart's chambers and valves are working. This procedure takes approximately one hour. There are no restrictions for this procedure. Please do NOT wear cologne, perfume, aftershave, or lotions (deodorant is allowed). Please arrive 15 minutes prior to your appointment time.     Follow-Up: At [facility], you and your health needs are our priority.  As part of our continuing mission to provide you with exceptional heart care, we have created designated Provider Care Teams.  These Care Teams include your primary Cardiologist (physician) and Advanced Practice Providers (APPs -  Physician Assistants and Nurse Practitioners) who all work together to provide you with the care you need, when you need it.  We recommend signing up for the patient portal called "MyChart".  Sign up information is provided on this After Visit Summary.  MyChart is used to connect with patients for Virtual Visits (Telemedicine).  Patients are able to view lab/test results, encounter notes, upcoming appointments, etc.  Non-urgent messages can be sent to  your provider as well.   To learn more about what you can do with MyChart, go to [email or web address].    Your next appointment:   9 month(s)  The format for your next appointment:   In Person  Provider:   Rajan Revankar, MD   Other Instructions Echocardiogram An echocardiogram is a test that uses sound waves (ultrasound) to produce images of the heart. Images from an echocardiogram can provide important information about: Heart size and shape. The size and thickness and movement of your heart's walls. Heart muscle function and strength. Heart valve function or if you have stenosis. Stenosis is when the heart valves are too narrow. If blood is flowing backward through the heart valves (regurgitation). A tumor or infectious growth around the heart valves. Areas of heart muscle that are not working well because of poor blood flow or injury from a heart attack. Aneurysm detection. An aneurysm is a weak or damaged part of an artery wall. The wall bulges out from the normal force of blood pumping through the body. Tell a health care provider about: Any allergies you have. All medicines you are taking, including vitamins, herbs, eye drops, creams, and over-the-counter medicines. Any blood disorders you have. Any surgeries you have had. Any medical conditions you have. Whether you are pregnant or may be pregnant. What are the risks? Generally, this is a safe test. However, problems may occur, including an allergic reaction to dye (contrast) that may be used during the test. What happens before the test? No specific preparation is needed. You may eat and drink normally. What happens during the test? You will   take off your clothes from the waist up and put on a hospital gown. Electrodes or electrocardiogram (ECG)patches may be placed on your chest. The electrodes or patches are then connected to a device that monitors your heart rate and rhythm. You will lie down on a table for an  ultrasound exam. A gel will be applied to your chest to help sound waves pass through your skin. A handheld device, called a transducer, will be pressed against your chest and moved over your heart. The transducer produces sound waves that travel to your heart and bounce back (or "echo" back) to the transducer. These sound waves will be captured in real-time and changed into images of your heart that can be viewed on a video monitor. The images will be recorded on a computer and reviewed by your health care provider. You may be asked to change positions or hold your breath for a short time. This makes it easier to get different views or better views of your heart. In some cases, you may receive contrast through an IV in one of your veins. This can improve the quality of the pictures from your heart. The procedure may vary among health care providers and hospitals.   What can I expect after the test? You may return to your normal, everyday life, including diet, activities, and medicines, unless your health care provider tells you not to do that. Follow these instructions at home: It is up to you to get the results of your test. Ask your health care provider, or the department that is doing the test, when your results will be ready. Keep all follow-up visits. This is important. Summary An echocardiogram is a test that uses sound waves (ultrasound) to produce images of the heart. Images from an echocardiogram can provide important information about the size and shape of your heart, heart muscle function, heart valve function, and other possible heart problems. You do not need to do anything to prepare before this test. You may eat and drink normally. After the echocardiogram is completed, you may return to your normal, everyday life, unless your health care provider tells you not to do that. This information is not intended to replace advice given to you by your health care provider. Make sure you  discuss any questions you have with your health care provider. Document Revised: 04/06/2020 Document Reviewed: 04/06/2020 Elsevier Patient Education  2021 Elsevier Inc.   Important Information About Sugar        

## 2023-02-27 ENCOUNTER — Other Ambulatory Visit: Payer: Self-pay | Admitting: Cardiology

## 2023-02-27 NOTE — Telephone Encounter (Signed)
RX sent

## 2023-03-05 ENCOUNTER — Other Ambulatory Visit: Payer: Self-pay | Admitting: Cardiology

## 2023-03-20 ENCOUNTER — Ambulatory Visit (HOSPITAL_BASED_OUTPATIENT_CLINIC_OR_DEPARTMENT_OTHER)
Admission: RE | Admit: 2023-03-20 | Discharge: 2023-03-20 | Disposition: A | Discharge status: Home / Self Care | Payer: Medicare Other | Source: Ambulatory Visit | Attending: Cardiology | Admitting: Cardiology

## 2023-03-20 DIAGNOSIS — I2510 Atherosclerotic heart disease of native coronary artery without angina pectoris: Secondary | ICD-10-CM | POA: Insufficient documentation

## 2023-03-20 LAB — ECHOCARDIOGRAM COMPLETE
Area-P 1/2: 3.76 cm2
MV M vel: 4.76 m/s
MV Peak grad: 90.6 mmHg
Radius: 0.3 cm
S' Lateral: 4.4 cm

## 2023-04-04 ENCOUNTER — Ambulatory Visit: Payer: Medicare Other | Attending: Cardiology | Admitting: Cardiology

## 2023-04-04 ENCOUNTER — Encounter: Payer: Self-pay | Admitting: Cardiology

## 2023-04-04 VITALS — BP 132/62 | HR 60 | Ht 62.0 in | Wt 137.0 lb

## 2023-04-04 DIAGNOSIS — E785 Hyperlipidemia, unspecified: Secondary | ICD-10-CM

## 2023-04-04 DIAGNOSIS — I2510 Atherosclerotic heart disease of native coronary artery without angina pectoris: Secondary | ICD-10-CM

## 2023-04-04 DIAGNOSIS — I10 Essential (primary) hypertension: Secondary | ICD-10-CM

## 2023-04-04 DIAGNOSIS — Z7984 Long term (current) use of oral hypoglycemic drugs: Secondary | ICD-10-CM

## 2023-04-04 DIAGNOSIS — Z951 Presence of aortocoronary bypass graft: Secondary | ICD-10-CM

## 2023-04-04 DIAGNOSIS — E088 Diabetes mellitus due to underlying condition with unspecified complications: Secondary | ICD-10-CM

## 2023-04-04 NOTE — Patient Instructions (Signed)
Medication Instructions:  Your physician recommends that you continue on your current medications as directed. Please refer to the Current Medication list given to you today.  *If you need a refill on your cardiac medications before your next appointment, please call your pharmacy*  Lab Work: None ordered today.  Testing/Procedures: None ordered today.  Follow-Up: At [facility], you and your health needs are our priority.  As part of our continuing mission to provide you with exceptional heart care, we have created designated Provider Care Teams.  These Care Teams include your primary Cardiologist (physician) and Advanced Practice Providers (APPs -  Physician Assistants and Nurse Practitioners) who all work together to provide you with the care you need, when you need it.  Your next appointment:   9 month(s)  The format for your next appointment:   In Person  Provider:   Belva Crome, MD{

## 2023-04-04 NOTE — Progress Notes (Signed)
Cardiology Office Note:    Date:  04/04/2023   ID:  Nicholas [name], DOB 09/27/1955, MRN [id number]  PCP:  Anselmo Pickler, MD  Cardiologist:  Garwin Brothers, MD   Referring MD: Anselmo Pickler, *    ASSESSMENT:    1. Coronary artery disease involving native coronary artery of native heart without angina pectoris   2. Primary hypertension   3. Diabetes mellitus due to underlying condition with unspecified complications (HCC)   4. Dyslipidemia, goal LDL below 70   5. S/P CABG x 3    PLAN:    In order of problems listed above:  Coronary artery disease: Secondary prevention stressed with the patient.  Importance of compliance with diet medications stressed and vocalized understanding.  He was advised to walk at least half an hour a day 5 days a week and he does that very regularly. Essential hypertension: Blood pressure is stable and diet was emphasized.  Salt intake issues and diet emphasized. Mixed dyslipidemia: On lipid-lowering medications followed by primary care.  Lipids reviewed from his cell phone and want to be fine.  I reviewed all lab work.  I counseled him about this. Diabetes mellitus: Managed by primary care.  Diet emphasized.  Risks explained. Patient will be seen in follow-up appointment in 9 months or earlier if the patient has any concerns.    Medication Adjustments/Labs and Tests Ordered: Current medicines are reviewed at length with the patient today.  Concerns regarding medicines are outlined above.  No orders of the defined types were placed in this encounter.  No orders of the defined types were placed in this encounter.    No chief complaint on file.    History of Present Illness:    Nicholas [name] is a 67 y.o. male.  Patient has past medical history of coronary artery disease, essential hypertension, dyslipidemia and diabetes mellitus.  He is post CABG surgery.  He denies any problems at this time and takes care of activities of daily living.   No chest pain orthopnea PND.  He walks at least 30 minutes a day without any problems.  At the time of my evaluation, the patient is alert awake oriented and in no distress.  Past Medical History:  Diagnosis Date   CAD (coronary artery disease) 07/15/2018   Carotid stenosis 02/21/2019   Chronic systolic (congestive) heart failure (HCC) 07/15/2018   Coronary artery disease    Diabetes mellitus due to underlying condition with unspecified complications (HCC) 05/31/2018   Dyslipidemia, goal LDL below 70 03/27/2018   Hyperlipidemia   Heart murmur    High cholesterol    HTN (hypertension) 07/15/2018   Hypertension    Non-insulin dependent type 2 diabetes mellitus (HCC) 03/27/2018   PONV (postoperative nausea and vomiting)    Pulmonary nodules 07/15/2018   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   03/04/18- Coronary artery bypass grafting x3 (free left internal mammary artery graft to left anterior descending, saphenous vein graft to obtuse marginal, saphenous vein graft to diagonal. Normal LVF pre op     Stricture of artery (HCC) 02/02/2012   Subclavian arterial stenosis (HCC) 01/19/2012   Left subclavian stenosis 75% with pressure gradient of 60 mm Hg at cath   Syncope 07/15/2018   Type II diabetes mellitus (HCC)     Past Surgical History:  Procedure Laterality Date   CATARACT EXTRACTION W/ INTRAOCULAR LENS  IMPLANT, BILATERAL Bilateral 2012-2017   CORONARY ARTERY BYPASS GRAFT N/A 03/04/2018   Procedure: CORONARY ARTERY BYPASS GRAFTING (  CABG) x 3: -FREE LIMA to LAD, -SVG to DIAGONAL, -SVG to OM; ENDOSCOPIC HARVEST GREATER SAPHENOUS VEIN: -Right Thigh  ;  Surgeon: Kerin Perna, MD;  Location: [facility] OR;  Service: Open Heart Surgery;  Laterality: N/A;   CORONARY PRESSURE/FFR STUDY N/A 03/01/2018   Procedure: INTRAVASCULAR PRESSURE WIRE/FFR STUDY;  Surgeon: Swaziland, Peter M, MD;  Location: [facility] INVASIVE CV LAB;  Service: Cardiovascular;  Laterality: N/A;   CORONARY STENT INTERVENTION N/A 05/20/2018   Procedure:  CORONARY STENT INTERVENTION;  Surgeon: Lyn Records, MD;  Location: [facility] INVASIVE CV LAB;  Service: Cardiovascular;  Laterality: N/A;   EYE SURGERY     LEFT HEART CATH AND CORONARY ANGIOGRAPHY N/A 03/01/2018   Procedure: LEFT HEART CATH AND CORONARY ANGIOGRAPHY;  Surgeon: Swaziland, Peter M, MD;  Location: [facility] INVASIVE CV LAB;  Service: Cardiovascular;  Laterality: N/A;   LEFT HEART CATH AND CORS/GRAFTS ANGIOGRAPHY N/A 05/16/2018   Procedure: LEFT HEART CATH AND CORS/GRAFTS ANGIOGRAPHY;  Surgeon: Lennette Bihari, MD;  Location: [facility] INVASIVE CV LAB;  Service: Cardiovascular;  Laterality: N/A;   LEFT HEART CATH AND CORS/GRAFTS ANGIOGRAPHY N/A 02/17/2021   Procedure: LEFT HEART CATH AND CORS/GRAFTS ANGIOGRAPHY;  Surgeon: Kathleene Hazel, MD;  Location: [facility] INVASIVE CV LAB;  Service: Cardiovascular;  Laterality: N/A;   TEE WITHOUT CARDIOVERSION N/A 03/04/2018   Procedure: TRANSESOPHAGEAL ECHOCARDIOGRAM (TEE);  Surgeon: Donata Clay, Theron Arista, MD;  Location: [facility] OR;  Service: Open Heart Surgery;  Laterality: N/A;    Current Medications: Current Meds  Medication Sig   atorvastatin (LIPITOR) 40 MG tablet Take 20 mg by mouth daily.   clopidogrel (PLAVIX) 75 MG tablet TAKE 1 TABLET BY MOUTH DAILY   finasteride (PROSCAR) 5 MG tablet Take 5 mg by mouth daily.   losartan (COZAAR) 25 MG tablet Take 25 mg by mouth daily.   metFORMIN (GLUCOPHAGE) 500 MG tablet Take 500 mg by mouth 2 (two) times daily with a meal.   nitroGLYCERIN (NITROSTAT) 0.4 MG SL tablet DISSOLVE 1 TAB UNDER TONGUE FOR CHEST PAIN - IF PAIN REMAINS AFTER 5 MIN, CALL 911 AND REPEAT DOSE. MAX 3 TABS IN 15 MINUTES   ranolazine (RANEXA) 1000 MG SR tablet TAKE 1 TABLET (1,000 MG TOTAL) BY MOUTH 2 (TWO) TIMES DAILY.     Allergies:   Egg-derived products and Latex   Social History   Socioeconomic History   Marital status: Married    Spouse name: Not on file   Number of children: Not on file   Years of education: Not on file   Highest education  level: Not on file  Occupational History   Not on file  Tobacco Use   Smoking status: Never   Smokeless tobacco: Never  Vaping Use   Vaping status: Never Used  Substance and Sexual Activity   Alcohol use: Not Currently    Comment: 05/20/2018 "stopped before 1999"   Drug use: Never   Sexual activity: Not on file  Other Topics Concern   Not on file  Social History Narrative   Not on file   Social Determinants of Health   Financial Resource Strain: Not on file  Food Insecurity: Not on file  Transportation Needs: Not on file  Physical Activity: Not on file  Stress: Not on file  Social Connections: Unknown (01/08/2022)   Received from [facility]   Social Network    Social Network: Not on file     Family History: The patient's family history includes Heart disease in his mother.  ROS:   Please see the history of present illness.    All other systems reviewed and are negative.  EKGs/Labs/Other Studies Reviewed:    The following studies were reviewed today: Echocardiogram was unremarkable and report was discussed with patient at length   Recent Labs: No results found for requested labs within last 365 days.  Recent Lipid Panel    Component Value Date/Time   CHOL 106 03/25/2021 0857   TRIG 107 03/25/2021 0857   HDL 40 03/25/2021 0857   CHOLHDL 2.7 03/25/2021 0857   CHOLHDL 6.1 02/28/2018 0346   VLDL 55 (H) 02/28/2018 0346   LDLCALC 46 03/25/2021 0857    Physical Exam:    VS:  BP 132/62   Pulse 60   Ht 5\' 2"  (1.575 m)   Wt 137 lb (62.1 kg)   SpO2 97%   BMI 25.06 kg/m     Wt Readings from Last 3 Encounters:  04/04/23 137 lb (62.1 kg)  02/21/23 135 lb 9.6 oz (61.5 kg)  04/18/22 137 lb 3.2 oz (62.2 kg)     GEN: Patient is in no acute distress HEENT: Normal NECK: No JVD; No carotid bruits LYMPHATICS: No lymphadenopathy CARDIAC: Hear sounds regular, 2/6 systolic murmur at the apex. RESPIRATORY:  Clear to auscultation without rales, wheezing or rhonchi   ABDOMEN: Soft, non-tender, non-distended MUSCULOSKELETAL:  No edema; No deformity  SKIN: Warm and dry NEUROLOGIC:  Alert and oriented x 3 PSYCHIATRIC:  Normal affect   Signed, Garwin Brothers, MD  04/04/2023 8:43 AM    [facility]

## 2023-05-28 ENCOUNTER — Other Ambulatory Visit: Payer: Self-pay | Admitting: Cardiology

## 2023-05-28 NOTE — Telephone Encounter (Signed)
Rx refill sent to pharmacy. 

## 2023-05-31 ENCOUNTER — Other Ambulatory Visit: Payer: Self-pay | Admitting: Cardiology

## 2023-06-13 LAB — LAB REPORT - SCANNED
Calcium: 9.2
EGFR: 93
Free T4: 1.32 ng/dL
TSH: 1.78 (ref 0.41–5.90)

## 2023-08-08 ENCOUNTER — Ambulatory Visit: Payer: Medicare Other | Attending: Cardiology | Admitting: Cardiology

## 2023-08-08 ENCOUNTER — Encounter: Payer: Self-pay | Admitting: Cardiology

## 2023-08-08 VITALS — BP 130/70 | HR 70 | Ht 62.0 in | Wt 140.1 lb

## 2023-08-08 DIAGNOSIS — E088 Diabetes mellitus due to underlying condition with unspecified complications: Secondary | ICD-10-CM | POA: Diagnosis not present

## 2023-08-08 DIAGNOSIS — I10 Essential (primary) hypertension: Secondary | ICD-10-CM | POA: Diagnosis not present

## 2023-08-08 DIAGNOSIS — Z951 Presence of aortocoronary bypass graft: Secondary | ICD-10-CM | POA: Diagnosis not present

## 2023-08-08 DIAGNOSIS — I2510 Atherosclerotic heart disease of native coronary artery without angina pectoris: Secondary | ICD-10-CM

## 2023-08-08 MED ORDER — ATORVASTATIN CALCIUM 20 MG PO TABS: 20.0000 mg | ORAL_TABLET | Freq: Every day | ORAL | 3 refills | Status: DC

## 2023-08-08 NOTE — Progress Notes (Signed)
Cardiology Office Note:    Date:  08/08/2023   ID:  Nicholas [name], DOB [birth year], MRN [id number]  PCP:  Anselmo Pickler, MD  Cardiologist:  Garwin Brothers, MD   Referring MD: Anselmo Pickler, *    ASSESSMENT:    1. Coronary artery disease involving native coronary artery of native heart without angina pectoris   2. Primary hypertension   3. Diabetes mellitus due to underlying condition with unspecified complications (HCC)   4. S/P CABG x 3    PLAN:    In order of problems listed above:  Coronary artery disease: Secondary prevention stressed to the patient.  Importance of compliance with diet medication stressed any vocalized understanding.  He was advised to continue his excellent exercise protocol. Essential hypertension: Blood pressure stable and diet was emphasized. Mixed dyslipidemia: On lipid-lowering medications followed by primary care.  Lipids reviewed. Diabetes mellitus: On medications.  He is careful with his diet and exercise. Patient will be seen in follow-up appointment in 6 months or earlier if the patient has any concerns.    Medication Adjustments/Labs and Tests Ordered: Current medicines are reviewed at length with the patient today.  Concerns regarding medicines are outlined above.  No orders of the defined types were placed in this encounter.  Meds ordered this encounter  Medications   atorvastatin (LIPITOR) 20 MG tablet    Sig: Take 1 tablet (20 mg total) by mouth daily.    Dispense:  90 tablet    Refill:  3     No chief complaint on file.    History of Present Illness:    Nicholas [name] is a 67 y.o. male.  Patient has past medical history of coronary artery disease post CABG surgery, essential hypertension, mixed dyslipidemia and diabetes mellitus.  He denies any problems at this time and takes care of activities of daily living.  He walks about 30 minutes a day 5 to 6 days a week.  With this he has no symptoms.  At the time of my  evaluation, the patient is alert awake oriented and in no distress.  Past Medical History:  Diagnosis Date   CAD (coronary artery disease) 07/15/2018   Carotid stenosis 02/21/2019   Chronic systolic (congestive) heart failure (HCC) 07/15/2018   Coronary artery disease    Diabetes mellitus due to underlying condition with unspecified complications (HCC) 05/31/2018   Dyslipidemia, goal LDL below 70 03/27/2018   Hyperlipidemia   Heart murmur    High cholesterol    HTN (hypertension) 07/15/2018   Hypertension    Non-insulin dependent type 2 diabetes mellitus (HCC) 03/27/2018   PONV (postoperative nausea and vomiting)    Pulmonary nodules 07/15/2018   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   03/04/18- Coronary artery bypass grafting x3 (free left internal mammary artery graft to left anterior descending, saphenous vein graft to obtuse marginal, saphenous vein graft to diagonal. Normal LVF pre op     Stricture of artery (HCC) 02/02/2012   Subclavian arterial stenosis (HCC) 01/19/2012   Left subclavian stenosis 75% with pressure gradient of 60 mm Hg at cath   Syncope 07/15/2018   Type II diabetes mellitus (HCC)     Past Surgical History:  Procedure Laterality Date   CATARACT EXTRACTION W/ INTRAOCULAR LENS  IMPLANT, BILATERAL Bilateral 2012-2017   CORONARY ARTERY BYPASS GRAFT N/A 03/04/2018   Procedure: CORONARY ARTERY BYPASS GRAFTING (CABG) x 3: -FREE LIMA to LAD, -SVG to DIAGONAL, -SVG to OM; ENDOSCOPIC HARVEST GREATER SAPHENOUS VEIN: -Right  Thigh  ;  Surgeon: Kerin Perna, MD;  Location: [facility] OR;  Service: Open Heart Surgery;  Laterality: N/A;   CORONARY PRESSURE/FFR STUDY N/A 03/01/2018   Procedure: INTRAVASCULAR PRESSURE WIRE/FFR STUDY;  Surgeon: Swaziland, Peter M, MD;  Location: [facility] INVASIVE CV LAB;  Service: Cardiovascular;  Laterality: N/A;   CORONARY STENT INTERVENTION N/A 05/20/2018   Procedure: CORONARY STENT INTERVENTION;  Surgeon: Lyn Records, MD;  Location: [facility] INVASIVE CV LAB;  Service:  Cardiovascular;  Laterality: N/A;   EYE SURGERY     LEFT HEART CATH AND CORONARY ANGIOGRAPHY N/A 03/01/2018   Procedure: LEFT HEART CATH AND CORONARY ANGIOGRAPHY;  Surgeon: Swaziland, Peter M, MD;  Location: [facility] INVASIVE CV LAB;  Service: Cardiovascular;  Laterality: N/A;   LEFT HEART CATH AND CORS/GRAFTS ANGIOGRAPHY N/A 05/16/2018   Procedure: LEFT HEART CATH AND CORS/GRAFTS ANGIOGRAPHY;  Surgeon: Lennette Bihari, MD;  Location: [facility] INVASIVE CV LAB;  Service: Cardiovascular;  Laterality: N/A;   LEFT HEART CATH AND CORS/GRAFTS ANGIOGRAPHY N/A 02/17/2021   Procedure: LEFT HEART CATH AND CORS/GRAFTS ANGIOGRAPHY;  Surgeon: Kathleene Hazel, MD;  Location: [facility] INVASIVE CV LAB;  Service: Cardiovascular;  Laterality: N/A;   TEE WITHOUT CARDIOVERSION N/A 03/04/2018   Procedure: TRANSESOPHAGEAL ECHOCARDIOGRAM (TEE);  Surgeon: Donata Clay, Theron Arista, MD;  Location: [facility] OR;  Service: Open Heart Surgery;  Laterality: N/A;    Current Medications: Current Meds  Medication Sig   clopidogrel (PLAVIX) 75 MG tablet TAKE 1 TABLET BY MOUTH DAILY   finasteride (PROSCAR) 5 MG tablet Take 5 mg by mouth daily.   losartan (COZAAR) 25 MG tablet Take 25 mg by mouth daily.   metFORMIN (GLUCOPHAGE) 500 MG tablet Take 500 mg by mouth 2 (two) times daily with a meal.   nitroGLYCERIN (NITROSTAT) 0.4 MG SL tablet DISSOLVE 1 TAB UNDER TONGUE FOR CHEST PAIN - IF PAIN REMAINS AFTER 5 MIN, CALL 911 AND REPEAT DOSE. MAX 3 TABS IN 15 MINUTES   ranolazine (RANEXA) 1000 MG SR tablet TAKE 1 TABLET BY MOUTH 2 TIMES A DAY   [DISCONTINUED] atorvastatin (LIPITOR) 40 MG tablet Take 20 mg by mouth daily.     Allergies:   Egg-derived products and Latex   Social History   Socioeconomic History   Marital status: Married    Spouse name: Not on file   Number of children: Not on file   Years of education: Not on file   Highest education level: Not on file  Occupational History   Not on file  Tobacco Use   Smoking status: Never   Smokeless  tobacco: Never  Vaping Use   Vaping status: Never Used  Substance and Sexual Activity   Alcohol use: Not Currently    Comment: 05/20/2018 "stopped before 1999"   Drug use: Never   Sexual activity: Not on file  Other Topics Concern   Not on file  Social History Narrative   Not on file   Social Determinants of Health   Financial Resource Strain: Not on file  Food Insecurity: Not on file  Transportation Needs: Not on file  Physical Activity: Not on file  Stress: Not on file  Social Connections: Unknown (01/08/2022)   [facility]   Social Network    Social Network: Not on file     Family History: The patient's family history includes Heart disease in his mother.  ROS:   Please see the history of present illness.    All other systems reviewed and are  negative.  EKGs/Labs/Other Studies Reviewed:    The following studies were reviewed today: I discussed my findings with the patient at length   Recent Labs: No results found for requested labs within last 365 days.  Recent Lipid Panel    Component Value Date/Time   CHOL 106 03/25/2021 0857   TRIG 107 03/25/2021 0857   HDL 40 03/25/2021 0857   CHOLHDL 2.7 03/25/2021 0857   CHOLHDL 6.1 02/28/2018 0346   VLDL 55 (H) 02/28/2018 0346   LDLCALC 46 03/25/2021 0857    Physical Exam:    VS:  BP 130/70   Pulse 70   Ht 5\' 2"  (1.575 m)   Wt 140 lb 1.9 oz (63.6 kg)   SpO2 94%   BMI 25.63 kg/m     Wt Readings from Last 3 Encounters:  08/08/23 140 lb 1.9 oz (63.6 kg)  04/04/23 137 lb (62.1 kg)  02/21/23 135 lb 9.6 oz (61.5 kg)     GEN: Patient is in no acute distress HEENT: Normal NECK: No JVD; No carotid bruits LYMPHATICS: No lymphadenopathy CARDIAC: Hear sounds regular, 2/6 systolic murmur at the apex. RESPIRATORY:  Clear to auscultation without rales, wheezing or rhonchi  ABDOMEN: Soft, non-tender, non-distended MUSCULOSKELETAL:  No edema; No deformity  SKIN: Warm and  dry NEUROLOGIC:  Alert and oriented x 3 PSYCHIATRIC:  Normal affect   Signed, Garwin Brothers, MD  08/08/2023 2:52 PM    [facility]

## 2023-08-08 NOTE — Patient Instructions (Signed)

## 2023-09-04 ENCOUNTER — Telehealth: Payer: Self-pay | Admitting: Cardiology

## 2023-09-04 MED ORDER — LOSARTAN POTASSIUM 25 MG PO TABS: 25.0000 mg | ORAL_TABLET | Freq: Every day | ORAL | 2 refills | Status: DC

## 2023-09-04 NOTE — Telephone Encounter (Signed)
*  STAT* If patient is at the pharmacy, call can be transferred to refill team.   1. Which medications need to be refilled? (please list name of each medication and dose if known)   losartan  (COZAAR ) 25 MG tablet     2. Would you like to learn more about the convenience, safety, & potential cost savings by using the [facility]? No   3. Are you open to using the [facility] (Type [facility]. ) No   4. Which pharmacy/location (including street and city if local pharmacy) is medication to be sent to?[facility] [id number] - [place], [place] - [address]    5. Do they need a 30 day or 90 day supply? 90 day

## 2024-02-20 ENCOUNTER — Other Ambulatory Visit: Payer: Self-pay | Admitting: Cardiology

## 2024-03-14 LAB — LAB REPORT - SCANNED: EGFR: 93

## 2024-05-21 ENCOUNTER — Other Ambulatory Visit: Payer: Self-pay | Admitting: Cardiology

## 2024-05-27 ENCOUNTER — Other Ambulatory Visit: Payer: Self-pay | Admitting: Cardiology

## 2024-06-12 ENCOUNTER — Other Ambulatory Visit: Payer: Self-pay

## 2024-06-20 ENCOUNTER — Encounter: Payer: Self-pay | Admitting: Cardiology

## 2024-06-20 ENCOUNTER — Ambulatory Visit: Attending: Cardiology | Admitting: Cardiology

## 2024-06-20 VITALS — BP 130/82 | HR 52 | Ht 62.0 in | Wt 142.0 lb

## 2024-06-20 DIAGNOSIS — E785 Hyperlipidemia, unspecified: Secondary | ICD-10-CM

## 2024-06-20 DIAGNOSIS — E088 Diabetes mellitus due to underlying condition with unspecified complications: Secondary | ICD-10-CM | POA: Diagnosis not present

## 2024-06-20 DIAGNOSIS — I10 Essential (primary) hypertension: Secondary | ICD-10-CM

## 2024-06-20 DIAGNOSIS — I2510 Atherosclerotic heart disease of native coronary artery without angina pectoris: Secondary | ICD-10-CM

## 2024-06-20 NOTE — Patient Instructions (Signed)

## 2024-06-20 NOTE — Progress Notes (Signed)
 Cardiology Office Note:    Date:  06/20/2024   ID:  Nicholas [name], DOB 09-14-1955, MRN [id number]  PCP:  Nicholas Donne Aurora, MD  Cardiologist:  Nicholas JONELLE Crape, MD   Referring MD: Nicholas [name], *    ASSESSMENT:    1. Primary hypertension   2. Coronary artery disease involving native coronary artery of native heart without angina pectoris   3. Diabetes mellitus due to underlying condition with unspecified complications (HCC)   4. Dyslipidemia, goal LDL below 70    PLAN:    In order of problems listed above:  Coronary artery disease: Secondary prevention stressed to the patient.  Importance of compliance with diet medication stressed any vocalized understanding.  He exercises well on a regular basis.  I congratulated him and advised him to continue this. Essential hypertension: Blood pressure is stable and diet was emphasized. Mixed dyslipidemia: On lipid-lowering medications followed by primary care.  Records reviewed from primary care and discussed with the patient. Diabetes mellitus: Managed by primary care.  Patient is doing well with diet and excise. Patient will be seen in follow-up appointment in 12 months or earlier if the patient has any concerns.    Medication Adjustments/Labs and Tests Ordered: Current medicines are reviewed at length with the patient today.  Concerns regarding medicines are outlined above.  Orders Placed This Encounter  Procedures   EKG 12-Lead   No orders of the defined types were placed in this encounter.    No chief complaint on file.    History of Present Illness:    Nicholas [name] is a 68 y.o. male.  Patient has past medical history of coronary artery disease, history of congestive heart failure, essential hypertension mixed dyslipidemia and diabetes mellitus.  He is post CABG surgery.  He denies any problems at this time and takes care of activities of daily living.  No chest pain orthopnea or PND.  He exercises on a regular  basis.  At the time of my evaluation, the patient is alert awake oriented and in no distress.  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 Normal LVF pre op     Stricture of artery 02/02/2012   Subclavian arterial stenosis 01/19/2012   Left subclavian stenosis 75% with pressure gradient of 60 mm Hg at cath   Syncope 07/15/2018   Type II diabetes mellitus (HCC)     Past Surgical History:  Procedure Laterality Date   CATARACT EXTRACTION W/ INTRAOCULAR LENS  IMPLANT, BILATERAL Bilateral 2012-2017   CORONARY ARTERY BYPASS GRAFT N/A 03/04/2018   Procedure: CORONARY ARTERY BYPASS GRAFTING (CABG) x 3: -FREE LIMA to LAD, -SVG to DIAGONAL, -SVG to OM; ENDOSCOPIC HARVEST GREATER SAPHENOUS VEIN: -Right Thigh  ;  Surgeon: Fleeta Hanford Coy, MD;  Location: [facility] OR;  Service: Open Heart Surgery;  Laterality: N/A;   CORONARY PRESSURE/FFR STUDY N/A 03/01/2018   Procedure: INTRAVASCULAR PRESSURE WIRE/FFR STUDY;  Surgeon: Swaziland, Peter M, MD;  Location: [facility] INVASIVE CV LAB;  Service: Cardiovascular;  Laterality: N/A;   CORONARY STENT INTERVENTION N/A 05/20/2018   Procedure: CORONARY STENT INTERVENTION;  Surgeon: Claudene Victory ORN, MD;  Location: [facility] INVASIVE CV LAB;   Service: Cardiovascular;  Laterality: N/A;   EYE SURGERY     LEFT HEART CATH AND CORONARY ANGIOGRAPHY N/A 03/01/2018   Procedure: LEFT HEART CATH AND CORONARY ANGIOGRAPHY;  Surgeon: Swazil[place], Peter M, MD;  Location[name] INVASIVE CV LAB;  Service: Cardiovascular;  Laterality: N/A;   LEFT HEART CATH AND CORS/GRAFTS ANGIOGRAPHY N/A 05/16/2018   Procedure: LEFT HEART CATH AND CORS/GRAFTS ANGIOGRAPHY;  Surgeon: Burnard Debby LABOR, MD;  Location: [facility] INVASIVE CV LAB;  Service: Cardiovascular;  Laterality: N/A;   LEFT HEART CATH AND CORS/GRAFTS ANGIOGRAPHY N/A 02/17/2021   Procedure: LEFT HEART CATH AND CORS/GRAFTS ANGIOGRAPHY;  Surgeon: Verlin Lonni BIRCH, MD;  Location: [facility] INVASIVE CV LAB;  Service: Cardiovascular;  Laterality: N/A;   TEE WITHOUT CARDIOVERSION N/A 03/04/2018   Procedure: TRANSESOPHAGEAL ECHOCARDIOGRAM (TEE);  Surgeon: Fleeta Ochoa, Maude, MD;  Location: [facility] OR;  Service: Open Heart Surgery;  Laterality: N/A;    Current Medications: Current Meds  Medication Sig   atorvastatin  (LIPITOR ) 20 MG tablet Take 1 tablet (20 mg total) by mouth daily.   clopidogrel  (PLAVIX ) 75 MG tablet TAKE 1 TABLET BY MOUTH DAILY   finasteride  (PROSCAR ) 5 MG tablet Take 5 mg by mouth daily.   fluticasone (FLONASE) 50 MCG/ACT nasal spray Place 1 spray into both nostrils daily.   losartan  (COZAAR ) 25 MG tablet TAKE 1 TABLET BY MOUTH DAILY   metFORMIN  (GLUCOPHAGE ) 500 MG tablet Take 500 mg by mouth 2 (two) times daily with a meal.   nitroGLYCERIN  (NITROSTAT ) 0.4 MG SL tablet DISSOLVE 1 TAB UNDER TONGUE FOR CHEST PAIN - IF PAIN REMAINS AFTER 5 MIN, CALL 911 AND REPEAT DOSE. MAX 3 TABS IN 15 MINUTES   ranolazine  (RANEXA ) 1000 MG SR tablet TAKE 1 TABLET BY MOUTH 2 TIMES A DAY     Allergies:   Egg protein-containing drug products and Latex   Social History   Socioeconomic History   Marital status: Married    Spouse name: Not on file   Number of children: Not on file   Years of education: Not on file   Highest  education level: Not on file  Occupational History   Not on file  Tobacco Use   Smoking status: Never   Smokeless tobacco: Never  Vaping Use   Vaping status: Never Used  Substance and Sexual Activity   Alcohol use: Not Currently    Comment: 05/20/2018 stopped before 1999   Drug use: Never   Sexual activity: Not on file  Other Topics Concern   Not on file  Social History Narrative   Not on file   Social Drivers of Health   Financial Resource Strain: Not on file  Food Insecurity: Not on file  Transportation Needs: Not on file  Physical Activity: Not on file  Stress: Not on file  Social Connections: Unknown (01/08/2022)   [facility] from [facility]   Social Network    Social Network: Not on file     Family History: The patient's family history includes Heart disease in his mother.  ROS:   Please see the history of present illness.  All other systems reviewed and are negative.  EKGs/Labs/Other Studies Reviewed:    The following studies were reviewed today: .SABRAEKG Interpretation Date/Time:  Friday June 20 2024 08:19:53 EDT Ventricular Rate:  57 PR Interval:  156 QRS Duration:  106 QT Interval:  420 QTC Calculation: 408 R Axis:   -10  Text Interpretation: Sinus bradycardia Left ventricular hypertrophy with repolarization abnormality ( R in aVL , Cornell product ) Cannot rule out Inferior infarct , age undetermined When compared with ECG of 21-Feb-2023 14:12, No significant change was found Confirmed by Edwyna Backers [phone]) on 06/20/2024 8:40:17 AM     Recent Labs: No results found for requested labs within last 365 days.  Recent Lipid Panel    Component Value Date/Time   CHOL 106 03/25/2021 0857   TRIG 107 03/25/2021 0857   HDL 40 03/25/2021 0857   CHOLHDL 2.7 03/25/2021 0857   CHOLHDL 6.1 02/28/2018 0346   VLDL 55 (H) 02/28/2018 0346   LDLCALC 46 03/25/2021 0857    Physical Exam:    VS:  BP 130/82   Pulse (!) 52   Ht 5' 2 (1.575 m)   Wt 142  lb (64.4 kg)   SpO2 99%   BMI 25.97 kg/m     Wt Readings from Last 3 Encounters:  06/20/24 142 lb (64.4 kg)  08/08/23 140 lb 1.9 oz (63.6 kg)  04/04/23 137 lb (62.1 kg)     GEN: Patient is in no acute distress HEENT: Normal NECK: No JVD; No carotid bruits LYMPHATICS: No lymphadenopathy CARDIAC: Hear sounds regular, 2/6 systolic murmur at the apex. RESPIRATORY:  Clear to auscultation without rales, wheezing or rhonchi  ABDOMEN: Soft, non-tender, non-distended MUSCULOSKELETAL:  No edema; No deformity  SKIN: Warm and dry NEUROLOGIC:  Alert and oriented x 3 PSYCHIATRIC:  Normal affect   Signed, Backers JONELLE Edwyna, MD  06/20/2024 8:52 AM    [facility]

## 2024-08-05 ENCOUNTER — Other Ambulatory Visit: Payer: Self-pay | Admitting: Cardiology

## 2024-08-17 ENCOUNTER — Other Ambulatory Visit: Payer: Self-pay | Admitting: Cardiology

## 2024-08-23 ENCOUNTER — Other Ambulatory Visit: Payer: Self-pay | Admitting: Cardiology
